# Patient Record
Sex: Female | Born: 1940
Health system: Southern US, Community
[De-identification: ages and names within clinical notes are randomized; demographics above are authoritative.]

## PROBLEM LIST (undated history)

## (undated) DIAGNOSIS — E213 Hyperparathyroidism, unspecified: Secondary | ICD-10-CM

## (undated) DIAGNOSIS — J302 Other seasonal allergic rhinitis: Secondary | ICD-10-CM

## (undated) DIAGNOSIS — I2510 Atherosclerotic heart disease of native coronary artery without angina pectoris: Secondary | ICD-10-CM

## (undated) DIAGNOSIS — E782 Mixed hyperlipidemia: Secondary | ICD-10-CM

## (undated) DIAGNOSIS — K219 Gastro-esophageal reflux disease without esophagitis: Secondary | ICD-10-CM

## (undated) DIAGNOSIS — M109 Gout, unspecified: Secondary | ICD-10-CM

## (undated) DIAGNOSIS — N183 Chronic kidney disease, stage 3 (moderate): Secondary | ICD-10-CM

## (undated) DIAGNOSIS — D638 Anemia in other chronic diseases classified elsewhere: Secondary | ICD-10-CM

## (undated) DIAGNOSIS — N184 Chronic kidney disease, stage 4 (severe): Secondary | ICD-10-CM

## (undated) DIAGNOSIS — I10 Essential (primary) hypertension: Secondary | ICD-10-CM

## (undated) HISTORY — DX: Chronic kidney disease, stage 4 (severe): N18.4

## (undated) HISTORY — DX: Gastro-esophageal reflux disease without esophagitis: K21.9

## (undated) HISTORY — DX: Mixed hyperlipidemia: E78.2

## (undated) HISTORY — DX: Chronic kidney disease, stage 3 (moderate): N18.3

## (undated) HISTORY — DX: Atherosclerotic heart disease of native coronary artery without angina pectoris: I25.10

## (undated) HISTORY — DX: Anemia in other chronic diseases classified elsewhere: D63.8

## (undated) HISTORY — DX: Essential (primary) hypertension: I10

## (undated) HISTORY — DX: Hyperparathyroidism, unspecified: E21.3

## (undated) HISTORY — DX: Gout, unspecified: M10.9

---

## 2008-12-31 ENCOUNTER — Encounter: Payer: Self-pay | Admitting: Endocrinology

## 2009-01-07 ENCOUNTER — Encounter: Payer: Self-pay | Admitting: Endocrinology

## 2009-01-19 ENCOUNTER — Encounter: Payer: Self-pay | Admitting: Endocrinology

## 2009-02-25 ENCOUNTER — Ambulatory Visit: Payer: Self-pay | Admitting: Endocrinology

## 2009-02-25 DIAGNOSIS — I10 Essential (primary) hypertension: Secondary | ICD-10-CM | POA: Insufficient documentation

## 2009-02-25 DIAGNOSIS — K219 Gastro-esophageal reflux disease without esophagitis: Secondary | ICD-10-CM | POA: Insufficient documentation

## 2009-02-25 DIAGNOSIS — J45909 Unspecified asthma, uncomplicated: Secondary | ICD-10-CM | POA: Insufficient documentation

## 2009-02-25 DIAGNOSIS — E212 Other hyperparathyroidism: Secondary | ICD-10-CM

## 2009-08-03 ENCOUNTER — Ambulatory Visit: Payer: Self-pay | Admitting: Endocrinology

## 2009-08-03 LAB — CONVERTED CEMR LAB
BUN: 27 mg/dL — ABNORMAL HIGH (ref 6–23)
Calcium: 11.5 mg/dL — ABNORMAL HIGH (ref 8.4–10.5)
GFR calc non Af Amer: 41.07 mL/min (ref >60)
Glucose, Bld: 109 mg/dL — ABNORMAL HIGH (ref 70–99)
Sodium: 143 meq/L (ref 135–145)

## 2009-08-06 ENCOUNTER — Telehealth: Payer: Self-pay | Admitting: Endocrinology

## 2009-08-11 ENCOUNTER — Telehealth: Payer: Self-pay | Admitting: Endocrinology

## 2011-11-02 ENCOUNTER — Other Ambulatory Visit (HOSPITAL_COMMUNITY): Payer: Self-pay | Admitting: General Surgery

## 2011-11-02 DIAGNOSIS — E0590 Thyrotoxicosis, unspecified without thyrotoxic crisis or storm: Secondary | ICD-10-CM

## 2011-11-03 ENCOUNTER — Encounter (HOSPITAL_COMMUNITY): Payer: Self-pay

## 2011-11-03 ENCOUNTER — Encounter (HOSPITAL_COMMUNITY)
Admission: RE | Admit: 2011-11-03 | Discharge: 2011-11-03 | Disposition: A | Discharge status: Home / Self Care | Payer: Medicare Other | Source: Ambulatory Visit | Attending: General Surgery | Admitting: General Surgery

## 2011-11-03 ENCOUNTER — Ambulatory Visit (HOSPITAL_COMMUNITY)
Admission: RE | Admit: 2011-11-03 | Discharge: 2011-11-03 | Disposition: A | Discharge status: Home / Self Care | Payer: Medicare Other | Source: Ambulatory Visit | Attending: General Surgery | Admitting: General Surgery

## 2011-11-03 DIAGNOSIS — E213 Hyperparathyroidism, unspecified: Secondary | ICD-10-CM | POA: Insufficient documentation

## 2011-11-03 MED ORDER — TECHNETIUM TC 99M SESTAMIBI - CARDIOLITE
25.0000 | Freq: Once | INTRAVENOUS | Status: AC | PRN
  Administered 2011-11-03: 11:00:00 24.8 via INTRAVENOUS

## 2011-11-06 ENCOUNTER — Ambulatory Visit (HOSPITAL_COMMUNITY)
Admission: RE | Admit: 2011-11-06 | Discharge: 2011-11-06 | Disposition: A | Discharge status: Home / Self Care | Payer: Medicare Other | Source: Ambulatory Visit | Attending: General Surgery | Admitting: General Surgery

## 2011-11-06 DIAGNOSIS — E0590 Thyrotoxicosis, unspecified without thyrotoxic crisis or storm: Secondary | ICD-10-CM

## 2011-11-06 MED ORDER — IOHEXOL 300 MG/ML  SOLN: 100.0000 mL | Freq: Once | INTRAMUSCULAR | Status: AC | PRN

## 2011-11-06 MED ORDER — IOHEXOL 300 MG/ML  SOLN
60.0000 mL | Freq: Once | INTRAMUSCULAR | Status: AC | PRN
  Administered 2011-11-06: 60 mL via INTRAVENOUS

## 2011-11-16 NOTE — H&P (Signed)
  NTS SOAP Note  Vital Signs:  Vitals as of: [id number]: Systolic 123XX123: Diastolic 83: Heart Rate 92: Temp 97.5F: Height 70ft 6in: Weight 204Lbs 0 Ounces: OFC 0in: Respiratory Rate 0: O2 Saturation 0: Pain Level 0: BMI 33  BMI : 32.93 kg/m2  Subjective: This 25 Years 24 Months old Female presents for of hyperparathyroidism.  Has had high calcium levels for many years.  Recently seen by Dr. Dorris Fetch who diagnosed primary hyperparathyroidism, and is referring her for surgery.  Review of Symptoms:  Constitutional:unremarkable Head:unremarkable Eyes:unremarkable Nose/Mouth/Throat:unremarkable Cardiovascular:unremarkable Respiratory:unremarkable Gastrointestinal:unremarkable Genitourinary:unremarkable Musculoskeletal:unremarkable Skin:unremarkable Hematolgic/Lymphatic:unremarkable Allergic/Immunologic:unremarkable   Past Medical History:Reviewed   Past Medical History  Surgical History: none Medical Problems:  High Blood pressure, High cholesterol, Thyroid problems Allergies: nkda Medications: unknown   Social History:Reviewed   Social History  Preferred Language: English ([place]) Ethnicity: Not Hispanic / Latino Age: 71 Years 6 Months Alcohol:  No Recreational drug(s):  No   Smoking Status: Former smoker reviewed on 11/02/2011 Started Date: 08/28/1959 Stopped Date: 08/28/1998   Family History:Reviewed   Family History  Is there a family history [id number]    Objective Information: General:unremarkable Skin:no rash or prominent lesions Head:Atraumatic; no masses; no abnormalities Eyes:conjunctiva clear, EOM intact, PERRL Throat:no erythema, exudates or lesions. Neck:Supple without lymphadenopathy.  Heart:RRR, no murmur or gallop.  Normal S1, S2.  No S3, S4.  Lungs:CTA bilaterally, no wheezes, rhonchi, rales.  Breathing unlabored.  Dr. Liliane Channel notes  reviewed.  Assessment:Hyperparathyroidism  Diagnosis &amp; Procedure: DiagnosisCode: 252.01, ProcedureCode: [id number],    Plan:Scheduled for parathyroidectomy on 12/15/11.  Risks and benefits of procedure were fully explained to the patient, who gives informed consent.

## 2011-12-08 ENCOUNTER — Encounter (HOSPITAL_COMMUNITY): Payer: Self-pay | Admitting: Pharmacy Technician

## 2011-12-11 ENCOUNTER — Encounter (HOSPITAL_COMMUNITY)
Admission: RE | Admit: 2011-12-11 | Discharge: 2011-12-11 | Disposition: A | Discharge status: Home / Self Care | Payer: Medicare Other | Source: Ambulatory Visit | Attending: General Surgery | Admitting: General Surgery

## 2011-12-11 ENCOUNTER — Encounter (HOSPITAL_COMMUNITY): Payer: Self-pay

## 2011-12-11 HISTORY — DX: Other seasonal allergic rhinitis: J30.2

## 2011-12-11 LAB — DIFFERENTIAL
Basophils Absolute: 0 10*3/uL (ref 0.0–0.1)
Basophils Relative: 1 % (ref 0–1)
Eosinophils Absolute: 0.4 10*3/uL (ref 0.0–0.7)
Neutro Abs: 3.1 10*3/uL (ref 1.7–7.7)
Neutrophils Relative %: 52 % (ref 43–77)

## 2011-12-11 LAB — CBC
MCH: 27.9 pg (ref 26.0–34.0)
MCHC: 32.7 g/dL (ref 30.0–36.0)
Platelets: 210 10*3/uL (ref 150–400)

## 2011-12-11 LAB — COMPREHENSIVE METABOLIC PANEL
ALT: 12 U/L (ref 0–35)
AST: 16 U/L (ref 0–37)
CO2: 27 mEq/L (ref 19–32)
Chloride: 100 mEq/L (ref 96–112)
Creatinine, Ser: 1.45 mg/dL — ABNORMAL HIGH (ref 0.50–1.10)
GFR calc Af Amer: 41 mL/min — ABNORMAL LOW (ref >90)
GFR calc non Af Amer: 35 mL/min — ABNORMAL LOW (ref >90)
Glucose, Bld: 103 mg/dL — ABNORMAL HIGH (ref 70–99)
Total Bilirubin: 0.4 mg/dL (ref 0.3–1.2)

## 2011-12-11 LAB — SURGICAL PCR SCREEN: Staphylococcus aureus: NEGATIVE

## 2011-12-11 NOTE — Patient Instructions (Signed)
[place]  12/11/2011   Your procedure is scheduled on:  Thursday, 12/15/11  Report to [facility] at 0730 AM.  Call this number if you have problems the morning of surgery: [phone]   Remember:   Do not eat food:After Midnight.  May have clear liquids:until Midnight .  Clear liquids include soda, tea, black coffee, apple or grape juice, broth.  Take these medicines the morning of surgery with A SIP OF WATER: lisinopril   Do not wear jewelry, make-up or nail polish.  Do not wear lotions, powders, or perfumes. You may wear deodorant.  Do not shave 48 hours prior to surgery.  Do not bring valuables to the hospital.  Contacts, dentures or bridgework may not be worn into surgery.  Leave suitcase in the car. After surgery it may be brought to your room.  For patients admitted to the hospital, checkout time is 11:00 AM the day of discharge.   Patients discharged the day of surgery will not be allowed to drive home.  Name and phone number of your driver: driver  Special Instructions: CHG Shower Use Special Wash: 1/2 bottle night before surgery and 1/2 bottle morning of surgery.   Please read over the following fact sheets that you were given: Pain Booklet, MRSA Information, Surgical Site Infection Prevention, Anesthesia Post-op Instructions and Care and Recovery After Surgery   PATIENT INSTRUCTIONS POST-ANESTHESIA  IMMEDIATELY FOLLOWING SURGERY:  Do not drive or operate machinery for the first twenty four hours after surgery.  Do not make any important decisions for twenty four hours after surgery or while taking narcotic pain medications or sedatives.  If you develop intractable nausea and vomiting or a severe headache please notify your doctor immediately.  FOLLOW-UP:  Please make an appointment with your surgeon as instructed. You do not need to follow up with anesthesia unless specifically instructed to do so.  WOUND CARE INSTRUCTIONS (if applicable):  Keep a dry clean dressing on  the anesthesia/puncture wound site if there is drainage.  Once the wound has quit draining you may leave it open to air.  Generally you should leave the bandage intact for twenty four hours unless there is drainage.  If the epidural site drains for more than 36-48 hours please call the anesthesia department.  QUESTIONS?:  Please feel free to call your physician or the hospital operator if you have any questions, and they will be happy to assist you.         Thyroidectomy Care After Refer to this sheet in the next few weeks. These instructions provide you with general information on caring for yourself after you leave the hospital. Your caregiver also may give you specific instructions. Your treatment has been planned according to the most current medical practices available, but problems sometimes occur. Call your caregiver if you have any problems or questions after your procedure. HOME CARE INSTRUCTIONS   It is normal to be sore for a few weeks following surgery. See your caregiver if your pain seems to be getting worse rather than better.   Only take over-the-counter or prescription medicines for pain, discomfort, or fever as directed by your caregiver. Avoid taking medicines that contain aspirin and ibuprofen because they increase the risk of bleeding.   Shower rather than bathe until instructed otherwise by your caregiver.   Change your bandages (dressings) as directed by your caregiver.   You may resume a normal diet and activities as directed by your caregiver.   Avoid lifting weight greater  than 20 lb (9 kg) or participating in heavy exercise or contact sports for 10 days or as instructed by your caregiver.   Make an appointment to see your caregiver for stitch (suture) or staple removal.  SEEK MEDICAL CARE IF:   You have increased bleeding from your wound.   You have redness, swelling, or increasing pain from your wound or in your neck.   There is pus coming from your wound.     You have an oral temperature above 102 F (38.9 C).   There is a bad smell coming from the wound or dressing.   You develop lightheadedness or feel faint.   You develop numbness, tingling, or muscle spasms in your arms, hands, feet, or face.   You have difficulty swallowing.  SEEK IMMEDIATE MEDICAL CARE IF:   You develop a rash.   You have difficulty breathing.   You hear whistling noises that come from your chest.   You develop a cough that becomes increasingly worse.   You develop any reaction or side effects to medicines given.   There is swelling in your neck.   You develop changes in speech or hoarseness, which is getting worse.  MAKE SURE YOU:   Understand these instructions.   Will watch your condition.   Will get help right away if you are not doing well or get worse.  Document Released: 03/03/2005 Document Revised: 08/03/2011 Document Reviewed: 10/21/2010 [facility] Patient Information 2012 [place].

## 2011-12-11 NOTE — Progress Notes (Signed)
12/11/11 1116  OBSTRUCTIVE SLEEP APNEA  Have you ever been diagnosed with sleep apnea through a sleep study? No  Do you snore loudly (loud enough to be heard through closed doors)?  1  Do you often feel tired, fatigued, or sleepy during the daytime? 1  Has anyone observed you stop breathing during your sleep? 0  Do you have, or are you being treated for high blood pressure? 1  BMI more than 35 kg/m2? 0  Age over 72 years old? 1  Neck circumference greater than 40 cm/18 inches? 0  Gender: 0  Obstructive Sleep Apnea Score 4   Score 4 or greater  Updated health history

## 2011-12-15 ENCOUNTER — Encounter (HOSPITAL_COMMUNITY): Payer: Self-pay | Admitting: Anesthesiology

## 2011-12-15 ENCOUNTER — Encounter (HOSPITAL_COMMUNITY): Payer: Self-pay | Admitting: *Deleted

## 2011-12-15 ENCOUNTER — Ambulatory Visit (HOSPITAL_COMMUNITY): Payer: Medicare Other | Admitting: Anesthesiology

## 2011-12-15 ENCOUNTER — Encounter (HOSPITAL_COMMUNITY)
Admission: RE | Disposition: A | Discharge status: Home / Self Care | Payer: Self-pay | Source: Ambulatory Visit | Attending: General Surgery

## 2011-12-15 ENCOUNTER — Ambulatory Visit (HOSPITAL_COMMUNITY)
Admission: RE | Admit: 2011-12-15 | Discharge: 2011-12-16 | Disposition: A | Discharge status: Home / Self Care | Payer: Medicare Other | Source: Ambulatory Visit | Attending: General Surgery | Admitting: General Surgery

## 2011-12-15 DIAGNOSIS — Z01812 Encounter for preprocedural laboratory examination: Secondary | ICD-10-CM | POA: Insufficient documentation

## 2011-12-15 DIAGNOSIS — E210 Primary hyperparathyroidism: Secondary | ICD-10-CM | POA: Insufficient documentation

## 2011-12-15 DIAGNOSIS — Z01810 Encounter for preprocedural cardiovascular examination: Secondary | ICD-10-CM | POA: Insufficient documentation

## 2011-12-15 DIAGNOSIS — E7800 Pure hypercholesterolemia, unspecified: Secondary | ICD-10-CM | POA: Insufficient documentation

## 2011-12-15 DIAGNOSIS — I10 Essential (primary) hypertension: Secondary | ICD-10-CM | POA: Insufficient documentation

## 2011-12-15 DIAGNOSIS — Z79899 Other long term (current) drug therapy: Secondary | ICD-10-CM | POA: Insufficient documentation

## 2011-12-15 HISTORY — PX: PARATHYROIDECTOMY: SHX19

## 2011-12-15 LAB — BASIC METABOLIC PANEL
CO2: 27 mEq/L (ref 19–32)
Chloride: 100 mEq/L (ref 96–112)
Creatinine, Ser: 1.74 mg/dL — ABNORMAL HIGH (ref 0.50–1.10)
Glucose, Bld: 162 mg/dL — ABNORMAL HIGH (ref 70–99)

## 2011-12-15 SURGERY — PARATHYROIDECTOMY
Anesthesia: General | Laterality: Bilateral | Wound class: Clean

## 2011-12-15 MED ORDER — ACETAMINOPHEN 10 MG/ML IV SOLN
1000.0000 mg | Freq: Four times a day (QID) | INTRAVENOUS | Status: AC
  Administered 2011-12-15 – 2011-12-16 (×4): 1000 mg via INTRAVENOUS
  Filled 2011-12-15 (×3): qty 100

## 2011-12-15 MED ORDER — ONDANSETRON HCL 4 MG/2ML IJ SOLN: 4.0000 mg | Freq: Four times a day (QID) | INTRAMUSCULAR | Status: DC | PRN

## 2011-12-15 MED ORDER — ENOXAPARIN SODIUM 40 MG/0.4ML ~~LOC~~ SOLN
SUBCUTANEOUS | Status: AC
  Administered 2011-12-15: 40 mg via SUBCUTANEOUS
  Filled 2011-12-15: qty 0.4

## 2011-12-15 MED ORDER — ONDANSETRON HCL 4 MG/2ML IJ SOLN
4.0000 mg | Freq: Once | INTRAMUSCULAR | Status: AC
  Administered 2011-12-15: 4 mg via INTRAVENOUS

## 2011-12-15 MED ORDER — FENTANYL CITRATE 0.05 MG/ML IJ SOLN
INTRAMUSCULAR | Status: AC
  Administered 2011-12-15: 50 ug via INTRAVENOUS
  Filled 2011-12-15: qty 5

## 2011-12-15 MED ORDER — NEOSTIGMINE METHYLSULFATE 1 MG/ML IJ SOLN
INTRAMUSCULAR | Status: DC | PRN
  Administered 2011-12-15: 1 mg via INTRAVENOUS

## 2011-12-15 MED ORDER — CEFAZOLIN SODIUM 1-5 GM-% IV SOLN
1.0000 g | INTRAVENOUS | Status: AC
  Administered 2011-12-15: 1 g via INTRAVENOUS

## 2011-12-15 MED ORDER — BUMETANIDE 1 MG PO TABS
2.0000 mg | ORAL_TABLET | Freq: Every day | ORAL | Status: DC
  Administered 2011-12-16: 2 mg via ORAL
  Filled 2011-12-15: qty 2

## 2011-12-15 MED ORDER — SUCCINYLCHOLINE CHLORIDE 20 MG/ML IJ SOLN
INTRAMUSCULAR | Status: AC
  Filled 2011-12-15: qty 1

## 2011-12-15 MED ORDER — GLYCOPYRROLATE 0.2 MG/ML IJ SOLN
0.2000 mg | Freq: Once | INTRAMUSCULAR | Status: AC
  Administered 2011-12-15: 0.2 mg via INTRAVENOUS

## 2011-12-15 MED ORDER — LACTATED RINGERS IV SOLN
INTRAVENOUS | Status: DC
  Administered 2011-12-15: 13:00:00 via INTRAVENOUS

## 2011-12-15 MED ORDER — MENTHOL 3 MG MT LOZG: 1.0000 | LOZENGE | OROMUCOSAL | Status: DC | PRN

## 2011-12-15 MED ORDER — BUPIVACAINE HCL (PF) 0.5 % IJ SOLN
INTRAMUSCULAR | Status: AC
  Filled 2011-12-15: qty 30

## 2011-12-15 MED ORDER — FENTANYL CITRATE 0.05 MG/ML IJ SOLN
INTRAMUSCULAR | Status: AC
  Administered 2011-12-15: 50 ug via INTRAVENOUS
  Filled 2011-12-15: qty 2

## 2011-12-15 MED ORDER — LIDOCAINE HCL (CARDIAC) 10 MG/ML IV SOLN
INTRAVENOUS | Status: DC | PRN
  Administered 2011-12-15: 10 mg via INTRAVENOUS

## 2011-12-15 MED ORDER — FENTANYL CITRATE 0.05 MG/ML IJ SOLN
25.0000 ug | INTRAMUSCULAR | Status: DC | PRN
  Administered 2011-12-15 (×2): 50 ug via INTRAVENOUS

## 2011-12-15 MED ORDER — ENOXAPARIN SODIUM 40 MG/0.4ML ~~LOC~~ SOLN
40.0000 mg | Freq: Once | SUBCUTANEOUS | Status: AC
  Administered 2011-12-15: 40 mg via SUBCUTANEOUS

## 2011-12-15 MED ORDER — HYDROMORPHONE HCL PF 1 MG/ML IJ SOLN
1.0000 mg | INTRAMUSCULAR | Status: DC | PRN
  Administered 2011-12-15: 1 mg via INTRAVENOUS
  Filled 2011-12-15: qty 1

## 2011-12-15 MED ORDER — ONDANSETRON HCL 4 MG/2ML IJ SOLN
INTRAMUSCULAR | Status: AC
  Filled 2011-12-15: qty 2

## 2011-12-15 MED ORDER — BUDESONIDE-FORMOTEROL FUMARATE 160-4.5 MCG/ACT IN AERO
2.0000 | INHALATION_SPRAY | Freq: Two times a day (BID) | RESPIRATORY_TRACT | Status: DC | PRN
  Filled 2011-12-15: qty 6

## 2011-12-15 MED ORDER — LISINOPRIL 10 MG PO TABS
20.0000 mg | ORAL_TABLET | Freq: Every day | ORAL | Status: DC
  Administered 2011-12-16: 20 mg via ORAL
  Filled 2011-12-15: qty 2

## 2011-12-15 MED ORDER — ONDANSETRON HCL 4 MG PO TABS: 4.0000 mg | ORAL_TABLET | Freq: Four times a day (QID) | ORAL | Status: DC | PRN

## 2011-12-15 MED ORDER — CEFAZOLIN SODIUM 1-5 GM-% IV SOLN
INTRAVENOUS | Status: AC
  Filled 2011-12-15: qty 50

## 2011-12-15 MED ORDER — ONDANSETRON HCL 4 MG/2ML IJ SOLN: 4.0000 mg | Freq: Once | INTRAMUSCULAR | Status: DC | PRN

## 2011-12-15 MED ORDER — MIDAZOLAM HCL 2 MG/2ML IJ SOLN
1.0000 mg | INTRAMUSCULAR | Status: DC | PRN
  Administered 2011-12-15: 2 mg via INTRAVENOUS

## 2011-12-15 MED ORDER — ACETAMINOPHEN 10 MG/ML IV SOLN
INTRAVENOUS | Status: AC
  Administered 2011-12-15: 1000 mg via INTRAVENOUS
  Filled 2011-12-15: qty 100

## 2011-12-15 MED ORDER — GLYCOPYRROLATE 0.2 MG/ML IJ SOLN
INTRAMUSCULAR | Status: AC
  Filled 2011-12-15: qty 1

## 2011-12-15 MED ORDER — MIDAZOLAM HCL 2 MG/2ML IJ SOLN
INTRAMUSCULAR | Status: AC
  Filled 2011-12-15: qty 2

## 2011-12-15 MED ORDER — ENOXAPARIN SODIUM 40 MG/0.4ML ~~LOC~~ SOLN
40.0000 mg | SUBCUTANEOUS | Status: DC
  Administered 2011-12-16: 40 mg via SUBCUTANEOUS
  Filled 2011-12-15: qty 0.4

## 2011-12-15 MED ORDER — GLYCOPYRROLATE 0.2 MG/ML IJ SOLN
INTRAMUSCULAR | Status: AC
  Administered 2011-12-15: 0.2 mg via INTRAVENOUS
  Filled 2011-12-15: qty 1

## 2011-12-15 MED ORDER — LACTATED RINGERS IV SOLN
INTRAVENOUS | Status: DC
  Administered 2011-12-15: 1000 mL via INTRAVENOUS

## 2011-12-15 MED ORDER — PROPOFOL 10 MG/ML IV EMUL
INTRAVENOUS | Status: DC | PRN
  Administered 2011-12-15: 150 mg via INTRAVENOUS
  Administered 2011-12-15: 30 mg via INTRAVENOUS

## 2011-12-15 MED ORDER — HEMOSTATIC AGENTS (NO CHARGE) OPTIME
TOPICAL | Status: DC | PRN
  Administered 2011-12-15: 1 via TOPICAL

## 2011-12-15 MED ORDER — SUCCINYLCHOLINE CHLORIDE 20 MG/ML IJ SOLN
INTRAMUSCULAR | Status: DC | PRN
  Administered 2011-12-15: 100 mg via INTRAVENOUS

## 2011-12-15 MED ORDER — ACETAMINOPHEN 325 MG PO TABS: 325.0000 mg | ORAL_TABLET | ORAL | Status: DC | PRN

## 2011-12-15 MED ORDER — ROCURONIUM BROMIDE 100 MG/10ML IV SOLN
INTRAVENOUS | Status: DC | PRN
  Administered 2011-12-15: 5 mg via INTRAVENOUS

## 2011-12-15 MED ORDER — NEOSTIGMINE METHYLSULFATE 1 MG/ML IJ SOLN
INTRAMUSCULAR | Status: AC
  Filled 2011-12-15: qty 10

## 2011-12-15 MED ORDER — BUPIVACAINE HCL (PF) 0.5 % IJ SOLN
INTRAMUSCULAR | Status: DC | PRN
  Administered 2011-12-15: 10 mL

## 2011-12-15 MED ORDER — FENTANYL CITRATE 0.05 MG/ML IJ SOLN
INTRAMUSCULAR | Status: DC | PRN
  Administered 2011-12-15: 50 ug via INTRAVENOUS
  Administered 2011-12-15 (×3): 25 ug via INTRAVENOUS
  Administered 2011-12-15: 50 ug via INTRAVENOUS

## 2011-12-15 MED ORDER — SODIUM CHLORIDE 0.9 % IR SOLN
Status: DC | PRN
  Administered 2011-12-15: 1000 mL

## 2011-12-15 MED ORDER — GLYCOPYRROLATE 0.2 MG/ML IJ SOLN
INTRAMUSCULAR | Status: DC | PRN
  Administered 2011-12-15: 0.2 mg via INTRAVENOUS

## 2011-12-15 SURGICAL SUPPLY — 58 items
APPLIER CLIP 11 MED OPEN (CLIP) ×2
APPLIER CLIP 9.375 SM OPEN (CLIP) ×6
ATTRACTOMAT 16X20 MAGNETIC DRP (DRAPES) ×2 IMPLANT
BAG HAMPER (MISCELLANEOUS) ×2 IMPLANT
BENZOIN TINCTURE PRP APPL 2/3 (GAUZE/BANDAGES/DRESSINGS) ×2 IMPLANT
BLADE SURG 15 STRL LF DISP TIS (BLADE) ×1 IMPLANT
BLADE SURG 15 STRL SS (BLADE) ×1
BLADE SURG SZ10 CARB STEEL (BLADE) ×2 IMPLANT
CLIP APPLIE 11 MED OPEN (CLIP) ×1 IMPLANT
CLIP APPLIE 9.375 SM OPEN (CLIP) ×3 IMPLANT
CLOTH BEACON ORANGE TIMEOUT ST (SAFETY) ×2 IMPLANT
CLSR STERI-STRIP ANTIMIC 1/2X4 (GAUZE/BANDAGES/DRESSINGS) ×2 IMPLANT
COVER SURGICAL LIGHT HANDLE (MISCELLANEOUS) ×4 IMPLANT
DERMABOND ADVANCED (GAUZE/BANDAGES/DRESSINGS)
DERMABOND ADVANCED .7 DNX12 (GAUZE/BANDAGES/DRESSINGS) IMPLANT
DRAPE PED LAPAROTOMY (DRAPES) ×2 IMPLANT
DRAPE PROXIMA HALF (DRAPES) ×2 IMPLANT
DURAPREP 26ML APPLICATOR (WOUND CARE) IMPLANT
DURAPREP 6ML APPLICATOR 50/CS (WOUND CARE) ×2 IMPLANT
ELECT NEEDLE TIP 2.8 STRL (NEEDLE) ×2 IMPLANT
ELECT REM PT RETURN 9FT ADLT (ELECTROSURGICAL) ×2
ELECTRODE REM PT RTRN 9FT ADLT (ELECTROSURGICAL) ×1 IMPLANT
FORMALIN 10 PREFIL 120ML (MISCELLANEOUS) ×4 IMPLANT
GAUZE SPONGE 4X4 16PLY XRAY LF (GAUZE/BANDAGES/DRESSINGS) ×4 IMPLANT
GLOVE BIO SURGEON STRL SZ7.5 (GLOVE) ×2 IMPLANT
GLOVE ECLIPSE 6.5 STRL STRAW (GLOVE) ×4 IMPLANT
GLOVE INDICATOR 7.0 STRL GRN (GLOVE) ×6 IMPLANT
GOWN STRL REIN XL XLG (GOWN DISPOSABLE) ×8 IMPLANT
HEMOSTAT SURGICEL 2X3 (HEMOSTASIS) ×2 IMPLANT
HEMOSTAT SURGICEL 4X8 (HEMOSTASIS) ×2 IMPLANT
KIT BLADEGUARD II DBL (SET/KITS/TRAYS/PACK) ×2 IMPLANT
KIT ROOM TURNOVER APOR (KITS) ×2 IMPLANT
MANIFOLD NEPTUNE II (INSTRUMENTS) ×2 IMPLANT
MARKER SKIN DUAL TIP RULER LAB (MISCELLANEOUS) ×2 IMPLANT
NEEDLE HYPO 25X1 1.5 SAFETY (NEEDLE) ×2 IMPLANT
NS IRRIG 1000ML POUR BTL (IV SOLUTION) ×2 IMPLANT
PACK BASIC III (CUSTOM PROCEDURE TRAY) ×1
PACK SRG BSC III STRL LF ECLPS (CUSTOM PROCEDURE TRAY) ×1 IMPLANT
PAD ARMBOARD 7.5X6 YLW CONV (MISCELLANEOUS) ×2 IMPLANT
PENCIL HANDSWITCHING (ELECTRODE) ×2 IMPLANT
SET BASIN LINEN APH (SET/KITS/TRAYS/PACK) ×2 IMPLANT
SHEARS HARMONIC 9CM CVD (BLADE) IMPLANT
SPONGE INTESTINAL PEANUT (DISPOSABLE) ×8 IMPLANT
STAPLER VISISTAT (STAPLE) ×2 IMPLANT
SUT ETHILON 3 0 FSL (SUTURE) ×2 IMPLANT
SUT ETHILON 4 0 PS 2 18 (SUTURE) ×2 IMPLANT
SUT SILK 2 0 (SUTURE) ×1
SUT SILK 2-0 18XBRD TIE 12 (SUTURE) ×1 IMPLANT
SUT SILK 3 0 (SUTURE)
SUT SILK 3-0 18XBRD TIE 12 (SUTURE) IMPLANT
SUT VIC AB 2-0 CT2 27 (SUTURE) ×4 IMPLANT
SUT VIC AB 3-0 SH 27 (SUTURE) ×1
SUT VIC AB 3-0 SH 27X BRD (SUTURE) ×1 IMPLANT
SUT VIC AB 4-0 PS2 27 (SUTURE) ×4 IMPLANT
SYR CONTROL 10ML LL (SYRINGE) ×2 IMPLANT
SYSTEM CHEST DRAIN TLS 7FR (DRAIN) ×2 IMPLANT
TOWEL OR 17X26 4PK STRL BLUE (TOWEL DISPOSABLE) IMPLANT
YANKAUER SUCT BULB TIP 10FT TU (MISCELLANEOUS) ×2 IMPLANT

## 2011-12-15 NOTE — Interval H&P Note (Signed)
History and Physical Interval Note:  12/15/2011 7:30 AM  Julia Santana  has presented today for surgery, with the diagnosis of Hyperthyroidism  The various methods of treatment have been discussed with the patient and family. After consideration of risks, benefits and other options for treatment, the patient has consented to  Procedure(s) (LRB): THYROIDECTOMY (Bilateral) as a surgical intervention .  The patients' history has been reviewed, patient examined, no change in status, stable for surgery.  I have reviewed the patients' chart and labs.  Questions were answered to the patient's satisfaction.     Aviva Signs A

## 2011-12-15 NOTE — Anesthesia Postprocedure Evaluation (Signed)
Anesthesia Post Note  Patient: Julia Santana  Procedure(s) Performed: Procedure(s) (LRB): PARATHYROIDECTOMY (Bilateral)  Anesthesia type: General  Patient location: 301  Post pain: Pain level controlled  Post assessment: Post-op Vital signs reviewed, Patient's Cardiovascular Status Stable, Respiratory Function Stable, Patent Airway, No signs of Nausea or vomiting and Pain level controlled  Last Vitals:  Filed Vitals:   12/15/11 1527  BP: 152/87  Pulse: 91  Temp: 36.3 C  Resp: 18    Post vital signs: Reviewed and stable  Level of consciousness: awake and alert   Complications: No apparent anesthesia complications

## 2011-12-15 NOTE — Transfer of Care (Signed)
Immediate Anesthesia Transfer of Care Note  Patient: Julia Santana  Procedure(s) Performed: Procedure(s) (LRB): THYROIDECTOMY (Bilateral)  Patient Location: PACU  Anesthesia Type: General  Level of Consciousness: awake  Airway & Oxygen Therapy: Patient Spontanous Breathing and non-rebreather face mask  Post-op Assessment: Report given to PACU RN, Post -op Vital signs reviewed and stable and Patient moving all extremities  Post vital signs: Reviewed and stable  Complications: No apparent anesthesia complications

## 2011-12-15 NOTE — Addendum Note (Signed)
Addendum  created 12/15/11 1716 by Vista Deck, CRNA   Modules edited:Notes Section

## 2011-12-15 NOTE — Anesthesia Postprocedure Evaluation (Signed)
Anesthesia Post Note  Patient: Julia Santana  Procedure(s) Performed: Procedure(s) (LRB): PARATHYROIDECTOMY (Bilateral)  Anesthesia type: General  Patient location: PACU  Post pain: Pain level controlled  Post assessment: Post-op Vital signs reviewed, Patient's Cardiovascular Status Stable, Respiratory Function Stable, Patent Airway, No signs of Nausea or vomiting and Pain level controlled  Last Vitals:  Filed Vitals:   12/15/11 1003  BP: 129/85  Pulse: 89  Temp: 36.5 C  Resp: 16    Post vital signs: Reviewed and stable  Level of consciousness: awake and alert   Complications: No apparent anesthesia complications

## 2011-12-15 NOTE — Anesthesia Procedure Notes (Signed)
Procedure Name: Intubation Date/Time: 12/15/2011 8:23 AM Performed by: Vista Deck Pre-anesthesia Checklist: Patient identified, Patient being monitored, Timeout performed, Emergency Drugs available and Suction available Patient Re-evaluated:Patient Re-evaluated prior to inductionOxygen Delivery Method: Circle System Utilized Preoxygenation: Pre-oxygenation with 100% oxygen Intubation Type: IV induction, Rapid sequence and Cricoid Pressure applied Laryngoscope Size: Mac and 3 Grade View: Grade I Tube type: Oral Tube size: 7.0 mm Number of attempts: 1 Airway Equipment and Method: stylet Placement Confirmation: ETT inserted through vocal cords under direct vision,  positive ETCO2 and breath sounds checked- equal and bilateral Secured at: 21 cm Tube secured with: Tape Dental Injury: Teeth and Oropharynx as per pre-operative assessment

## 2011-12-15 NOTE — Anesthesia Preprocedure Evaluation (Signed)
Anesthesia Evaluation  Patient identified by MRN, date of birth, ID band Patient awake    Reviewed: Allergy & Precautions, H&P , NPO status , Patient's Chart, lab work & pertinent test results  Airway Mallampati: II TM Distance: >3 FB Neck ROM: Full    Dental No notable dental hx. (+) Edentulous Upper and Upper Dentures   Pulmonary asthma , sleep apnea ,    Pulmonary exam normal       Cardiovascular hypertension, Pt. on medications Rhythm:Regular Rate:Normal     Neuro/Psych negative neurological ROS  negative psych ROS   GI/Hepatic Neg liver ROS, GERD-  Medicated and Controlled,  Endo/Other  Hyperthyroidism (hyperparathyroid)   Renal/GU Renal InsufficiencyRenal disease     Musculoskeletal negative musculoskeletal ROS (+)   Abdominal Normal abdominal exam  (+)   Peds  Hematology  (+) Blood dyscrasia, anemia ,   Anesthesia Other Findings   Reproductive/Obstetrics negative OB ROS                           Anesthesia Physical Anesthesia Plan  ASA: II  Anesthesia Plan: General   Post-op Pain Management:    Induction: Intravenous, Rapid sequence and Cricoid pressure planned  Airway Management Planned: Oral ETT  Additional Equipment:   Intra-op Plan:   Post-operative Plan: Extubation in OR  Informed Consent: I have reviewed the patients History and Physical, chart, labs and discussed the procedure including the risks, benefits and alternatives for the proposed anesthesia with the patient or authorized representative who has indicated his/her understanding and acceptance.     Plan Discussed with: CRNA  Anesthesia Plan Comments:         Anesthesia Quick Evaluation

## 2011-12-15 NOTE — Op Note (Signed)
Patient:  Julia Santana  DOB:  1941/06/05  MRN:  [id number]   Preop Diagnosis:  Primary hyperparathyroidism, hypercalcemia  Postop Diagnosis:  Same  Procedure:  Parathyroidectomy  Surgeon:  Aviva Signs, M.D.  Anes:  General endotracheal  Indications:  Patient is a 71 year old white female presents with primary hyperparathyroidism with resultant hypercalcemia. She has had extensive workup and the only thing that was suspicious was increased activity in the right superior aspect of the neck. This was very nonspecific in nature. She now comes the operating room for a parathyroidectomy. The risks and benefits of the procedure including bleeding, infection, nerve injury, and the possibility of continuing hypercalcemia were fully explained to the patient, gave informed consent.  Procedure note:  Patient is placed the supine position. After induction of general endotracheal anesthesia, the neck was prepped and draped using usual sterile technique with Clorpactin surgical site confirmation was performed.  A transverse incision was made above the jugular notch. The platysma was then divided. The strap muscles were then divided along the midline and separated laterally. First turned our attention to the right thyroid. The middle thyroidal artery and vein were ligated and divided using clips. Both the inferior and superior right parathyroid glands were identified. These were removed separately and using clips and sent to pathology further examination. On the left side, the superior parathyroid gland was found. This was sent to pathology further examination. Again, middle thyroidal artery and vein were ligated and divided using clips. I could not definitively define the inferior left parathyroid. In palpating the left lobe of thyroid gland, I cannot feel a dominant mass. As 3 parathyroid glands were already removed, I decided not to proceed with more extensive dissection for the fourth left inferior  parathyroid gland. Surgicel is placed in both areas. The strap muscles reapproximated using 2-0 Vicryl running suture. The platysma was closed using 3-0 Vicryl running suture. The skin was closed using a 4-0 Vicryl subcuticular suture. 0.5% Sensorcaine was instilled the surrounding wound. Steri-Strips and were applied.  All tape and needle counts were correct the end of the procedure. Patient was extubated in the operating room and went back to recovery room awake in stable condition. She was able to phonate E. without difficulty.  Complications:  None  EBL:  Minimal  Specimen:  Right superior parathyroid, right inferior parathyroid, left superior parathyroid

## 2011-12-16 LAB — BASIC METABOLIC PANEL
CO2: 25 mEq/L (ref 19–32)
Calcium: 11.7 mg/dL — ABNORMAL HIGH (ref 8.4–10.5)
Glucose, Bld: 103 mg/dL — ABNORMAL HIGH (ref 70–99)
Sodium: 134 mEq/L — ABNORMAL LOW (ref 135–145)

## 2011-12-16 LAB — CBC
Hemoglobin: 10.1 g/dL — ABNORMAL LOW (ref 12.0–15.0)
MCH: 28.1 pg (ref 26.0–34.0)
MCV: 87.2 fL (ref 78.0–100.0)
Platelets: 189 10*3/uL (ref 150–400)
RBC: 3.6 MIL/uL — ABNORMAL LOW (ref 3.87–5.11)

## 2011-12-16 MED ORDER — HYDROCODONE-ACETAMINOPHEN 5-325 MG PO TABS: 1.0000 | ORAL_TABLET | Freq: Four times a day (QID) | ORAL | Status: AC | PRN

## 2011-12-16 NOTE — Discharge Summary (Signed)
Physician Discharge Summary  Patient ID: Julia Santana MRN: [id number] DOB/AGE: 1941/02/04 71 y.o.  Admit date: 12/15/2011 Discharge date: 12/16/2011  Admission Diagnoses: Primary hyperparathyroidism  Discharge Diagnoses: Same Active Problems:  * No active hospital problems. *    Discharged Condition: good  Hospital Course: Patient is a 71 year old black female who presented to [facility] for surgery for treatment of primary hyperparathyroidism. She underwent a parathyroidectomy on 12/15/2011. She tolerated the procedure well. Postoperative course has been unremarkable. She has no significant voice changes. Her calcium did drop from a preop level of 12.2 to 11.4-11.7. She is being discharged home in good improving condition the  Treatments: surgery: Parathyroidectomy  Discharge Exam: Blood pressure 142/75, pulse 73, temperature 97.9 F (36.6 C), temperature source Oral, resp. rate 18, height 5\' 9"  (1.753 m), weight 90.719 kg (200 lb), SpO2 88.00%. General appearance: alert, cooperative and no distress Neck: Incision healing well with mild swelling present. No hematoma noted Resp: clear to auscultation bilaterally Cardio: regular rate and rhythm, S1, S2 normal, no murmur, click, rub or gallop  Disposition: Home  Medication List  As of 12/16/2011 10:17 AM   TAKE these medications         budesonide-formoterol 160-4.5 MCG/ACT inhaler   Commonly known as: SYMBICORT   Inhale 2 puffs into the lungs as needed. Shortness of breath      bumetanide 2 MG tablet   Commonly known as: BUMEX   Take 2 mg by mouth daily.      HYDROcodone-acetaminophen 5-325 MG per tablet   Commonly known as: NORCO   Take 1 tablet by mouth every 6 (six) hours as needed for pain.      lisinopril 20 MG tablet   Commonly known as: PRINIVIL,ZESTRIL   Take 20 mg by mouth daily.           Follow-up Information    Follow up with Jamesetta So, MD. Schedule an appointment as soon as possible for a  visit on 12/21/2011.   Contact information:   [address] [place] [phone]          Signed: Jamesetta So 12/16/2011, 10:17 AM

## 2011-12-16 NOTE — Discharge Instructions (Signed)
Parathyroidectomy A parathyroidectomy is surgery to remove one or more parathyroid glands. These glands produce a hormone (parathyroid hormone) that helps control the level of calcium in your body. The glands are very small, about the size of a pea. They are located in your neck, close to your thyroid gland and your Adam's apple. Most people (85%) have four parathyroid glands,some people may have one or two more than that. Hyperparathyroidism is when too much parathyroid hormone is being produced. Usually this is caused by one of the parathyroid glands becoming enlarged, but it can also be caused by more than one of the glands. Hyperparathyroidism is found during blood tests that show high calcium in the blood. Parathyroid hormone levels will also be elevated. Cancer also can cause hyperparathyroidism, but this is rare. For the most common type of hyperparathyroidism, the treatment is surgical removal of the parathyroid gland that is enlarged. For patients with kidney failure and hyperparathyroidism, other treatment will be tried before surgery is done on the parathyroid.  Many times x-ray studies are done to find out which parathyroid gland or glands is malfunctioning. The decision about the best treatment for hyperparathyroidism is between the patient, their primary doctor, an endocrinologist, and a surgeon experienced in parathyroid surgery. LET YOUR CAREGIVER KNOW ABOUT:  Any allergies.   All medications you are taking, including:   Herbs, eyedrops, over-the-counter medications and creams.   Blood thinners (anticoagulants), aspirin or other drugs that could affect blood clotting.   Use of steroids (by mouth or as creams).   Previous problems with anesthetics, including local anesthetics.   Possibility of pregnancy, if this applies.   Any history of blood clots.   Any history of bleeding or other blood problems.   Previous surgery.   Smoking history.   Other health problems.  RISKS  AND COMPLICATIONS   Short-term possibilities include:   Excessive bleeding.   Pain.   Infection near the incision.   Slow healing.   Pooling of blood under the wound (hematoma).   Damage to nerves in your neck.   Blood clots.   Difficulty breathing. This is very rare. It also is almost always temporary.   Longer-term possibilities include:   Scarring.   Skin damage.   Damage to blood vessels in the area.   Need for additional surgery.   A hoarse or weak voice. This is usually temporary. It can be the result of nerve damage.   Development of hypoparathyroidism. This means you are not making enough parathyroid hormone. It is rare. If it occurs, you will need to take calcium supplements daily.  BEFORE THE PROCEDURE  Sometimes the surgery is done on an outpatient basis. This means you could go home the same day as your surgery. Other times, people need to stay in the hospital overnight. Ask your surgeon what you should expect.   If your surgery will be an outpatient procedure, arrange for someone to drive you home after the surgery.   Two weeks before your surgery, stop using aspirin and non-steroidal anti-inflammatory drugs (NSAID's) for pain relief. This includes prescription drugs and over-the-counter drugs such as ibuprofen and naproxen. Also stop taking vitamin E.   If you take blood-thinners, ask your healthcare provider when you should stop taking them.   Do not eat or drink for about 8 hours before your surgery.   You might be asked to shower or wash with a special antibacterial soap before the procedure.   Arrive at least an hour before the  surgery, or whenever your surgeon recommends. This will give you time to check in and fill out any needed paperwork.  PROCEDURE  The preparation:   You will change into a hospital gown.   You will be given an IV. A needle will be inserted in your arm. Medication will be able to flow directly into your body through this  needle.   You might be given a sedative to help you relax.   You will be given a drug that puts you to sleep during the surgery (general anesthetic).   The procedure:   Once you are asleep, the surgeon will make a small cut (incision) in your lower neck. Ask your surgeon where the incision will be.   The surgeon will look for the gland(s) that are not working well. Often a tissue sample from a gland is used to determine this.   Any glands that are not working well will be removed.   The surgeon will close the incision with stitches, often these are hidden under the skin.  AFTER THE PROCEDURE  You will stay in a recovery area until the anesthesia has worn off. Your blood pressure and heart rate will be checked.   If your surgery was an outpatient procedure, you will go home the same day.   If you need to stay in the hospital, you will be moved to a hospital room. You will probably stay for two to three days. This will depend on how quickly you recover.   While you are in the hospital, your blood will be tested to check the calcium levels in your body.  HOME CARE INSTRUCTIONS   Take any medication that your surgeon prescribes. Follow the directions carefully. Take all of the medication.   Ask your surgeon whether you can take over-the-counter medicines for pain, discomfort or fever. Do not take aspirin unless your healthcare provider says to. Aspirin increases the chances of bleeding.   Do not get the wound wet for the first few days after surgery (or until the surgeon tells you it is OK).  SEEK MEDICAL CARE IF:   You notice blood or fluid leaking from the wound, or it becomes red or swollen.   You have trouble breathing.   You have trouble speaking.   You become nauseous or throw up for more than two days after the surgery.   You develop a fever of more than 100.5 F (38.1 C).  SEEK IMMEDIATE MEDICAL CARE IF:   Breathing becomes more difficult.   You develop a fever of  102.0 F (38.9 C) or higher.  Document Released: 11/10/2008 Document Revised: 08/03/2011 Document Reviewed: 11/10/2008 [facility] Patient Information 2012 [place].

## 2011-12-17 NOTE — Progress Notes (Signed)
Patient received discharge instructions along with follow up appointments and prescriptions. Patient verbalized understanding of all instructions.. Patient was escorted by staff via wheelchair to vehicle patient discharged to home in stable condition.

## 2011-12-19 ENCOUNTER — Encounter (HOSPITAL_COMMUNITY): Payer: Self-pay | Admitting: General Surgery

## 2012-03-19 DIAGNOSIS — R079 Chest pain, unspecified: Secondary | ICD-10-CM

## 2012-03-20 ENCOUNTER — Ambulatory Visit (INDEPENDENT_AMBULATORY_CARE_PROVIDER_SITE_OTHER): Payer: Medicare Other | Admitting: Cardiology

## 2012-03-20 ENCOUNTER — Encounter: Payer: Self-pay | Admitting: Cardiology

## 2012-03-20 ENCOUNTER — Other Ambulatory Visit: Payer: Self-pay | Admitting: Cardiology

## 2012-03-20 ENCOUNTER — Encounter: Payer: Self-pay | Admitting: *Deleted

## 2012-03-20 ENCOUNTER — Telehealth: Payer: Self-pay | Admitting: Cardiology

## 2012-03-20 VITALS — BP 157/83 | HR 79 | Ht 66.0 in | Wt 202.0 lb

## 2012-03-20 DIAGNOSIS — I200 Unstable angina: Secondary | ICD-10-CM

## 2012-03-20 DIAGNOSIS — Z01810 Encounter for preprocedural cardiovascular examination: Secondary | ICD-10-CM

## 2012-03-20 DIAGNOSIS — N289 Disorder of kidney and ureter, unspecified: Secondary | ICD-10-CM

## 2012-03-20 DIAGNOSIS — R9430 Abnormal result of cardiovascular function study, unspecified: Secondary | ICD-10-CM

## 2012-03-20 DIAGNOSIS — R079 Chest pain, unspecified: Secondary | ICD-10-CM

## 2012-03-20 DIAGNOSIS — E782 Mixed hyperlipidemia: Secondary | ICD-10-CM | POA: Insufficient documentation

## 2012-03-20 DIAGNOSIS — R9439 Abnormal result of other cardiovascular function study: Secondary | ICD-10-CM

## 2012-03-20 DIAGNOSIS — I10 Essential (primary) hypertension: Secondary | ICD-10-CM

## 2012-03-20 LAB — PROTIME-INR

## 2012-03-20 MED ORDER — ASPIRIN EC 81 MG PO TBEC: 81.0000 mg | DELAYED_RELEASE_TABLET | Freq: Every day | ORAL | Status: DC

## 2012-03-20 MED ORDER — NITROGLYCERIN 0.4 MG SL SUBL: 0.4000 mg | SUBLINGUAL_TABLET | SUBLINGUAL | Status: DC | PRN

## 2012-03-20 MED ORDER — METOPROLOL SUCCINATE ER 25 MG PO TB24: 25.0000 mg | ORAL_TABLET | Freq: Every day | ORAL | Status: DC

## 2012-03-20 NOTE — Telephone Encounter (Signed)
CHECKING PERCERT FOR CATH

## 2012-03-20 NOTE — Assessment & Plan Note (Addendum)
Three-month history as outlined above. Baseline risk factors include long-standing hypertension, untreated hyperlipidemia. Recent Myoview results are abnormal as discussed. Plan is to proceed with an outpatient diagnostic cardiac catheterization with limited contrast (no ventriculogram) to assess for possible revascularization options. Risks and benefits were discussed, and she is in agreement to proceed. Followup lab work pending. We are arranging her catheterization for late morning to early afternoon so that she can be hydrated preprocedure. Plan to hold both Lisinopril and Bumex for the procedure. We are starting aspirin, p.r.n. Nitroglycerin, and low-dose Toprol-XL.

## 2012-03-20 NOTE — Patient Instructions (Addendum)
Your physician has requested that you have a cardiac catheterization Friday July 26th 2013 @12 :30 pm. Cardiac catheterization is used to diagnose and/or treat various heart conditions. Doctors may recommend this procedure for a number of different reasons. The most common reason is to evaluate chest pain. Chest pain can be a symptom of coronary artery disease (CAD), and cardiac catheterization can show whether plaque is narrowing or blocking your heart's arteries. This procedure is also used to evaluate the valves, as well as measure the blood flow and oxygen levels in different parts of your heart. For further information please visit [facility]. Please follow instruction sheet, as given. Your physician has recommended you make the following change in your medication: start Toprol XL 25 mg daily. Start Aspirin 81 mg daily.You have been given a new prescription for nitroglycerin to use as needed for chest pain.  Your physician recommends that you return for lab work in: [facility] FOR PRE CATH LAB WORK. A chest x-ray takes a picture of the organs and structures inside the chest, including the heart, lungs, and blood vessels. This test can show several things, including, whether the heart is enlarges; whether fluid is building up in the lungs; and whether pacemaker / defibrillator leads are still in place. PLEASE HAVE YOUR CHEST X-RAY DONE TODAY AT [facility].

## 2012-03-20 NOTE — Assessment & Plan Note (Signed)
CKD, appears to be stage III. Last creatinine 1.7 which was stable. Followup labs pending. As noted, risk of contrast nephropathy was discussed. She will be hydrated preprocedure. Plan to hold Bumex and lisinopril.

## 2012-03-20 NOTE — Progress Notes (Signed)
Clinical Summary Julia Santana is a 71 y.o.female referred by Julia Santana from [place] secondary to recent abnormal Myoview study. She describes a three-month history of progressive chest pain described as a heaviness, associated with increased levels of activity such as walking uphill or up steps. More recently she has been experiencing symptoms at night that wake her up.  She was referred for a Lexiscan Myoview on 7/23 that demonstrated abnormal ST segment depression of 1.5-2 mm in the inferior leads, perfusion imaging demonstrating moderate anteroseptal and apical partially reversible defects concerning for ischemia and possible scar, although breast attenuation was also noted. LVEF was 60% and the TID ratio was borderline at 1.18.  She has a known history of chronic kidney disease, asymptomatic, and on an ACE inhibitor at baseline. Lab work from April of this year showed hemoglobin 10.1, platelets 189, sodium 134, potassium 4.4, BUN 29, creatinine 1.7. Previous lipid profile showed cholesterol 254, triglycerides 199, HDL 38, LDL 176. She reports not being able to tolerate Crestor or Lipitor. Presently not on any statin therapy.  We discussed her symptoms, concerning for angina, and also her recent Myoview suggesting underlying obstructive CAD. We discussed the rationale, risks and benefits of diagnostic cardiac catheterization to assess for revascularization options. We also reviewed her risk of contrast-induced nephropathy. After considering these issues, she is in agreement to proceed.  Allergies  Allergen Reactions  . Losartan Potassium     Unsure of what happened when she took this  . Furosemide Cough    Current Outpatient Prescriptions  Medication Sig Dispense Refill  . aspirin EC 81 MG tablet Take 1 tablet (81 mg total) by mouth daily.  30 tablet  6  . budesonide-formoterol (SYMBICORT) 160-4.5 MCG/ACT inhaler Inhale 2 puffs into the lungs as needed. Shortness of  breath      . bumetanide (BUMEX) 2 MG tablet Take 2 mg by mouth daily.      Marland Kitchen lisinopril (PRINIVIL,ZESTRIL) 20 MG tablet Take 20 mg by mouth daily.      . metoprolol succinate (TOPROL-XL) 25 MG 24 hr tablet Take 1 tablet (25 mg total) by mouth daily.  30 tablet  6  . nitroGLYCERIN (NITROSTAT) 0.4 MG SL tablet Place 1 tablet (0.4 mg total) under the tongue every 5 (five) minutes as needed.  25 tablet  3  . DISCONTD: metoprolol succinate (TOPROL-XL) 25 MG 24 hr tablet Take 25 mg by mouth daily.      Marland Kitchen DISCONTD: nitroGLYCERIN (NITROSTAT) 0.4 MG SL tablet Place 0.4 mg under the tongue every 5 (five) minutes as needed.        Past Medical History  Diagnosis Date  . Renal insufficiency   . Essential hypertension, benign   . Asthma   . Mixed hyperlipidemia   . Hyperparathyroidism   . Seasonal allergies   . Sleep apnea     STOP BANG score = 4    Past Surgical History  Procedure Date  . Parathyroidectomy 12/15/2011    Procedure: PARATHYROIDECTOMY;  Surgeon: Jamesetta So, MD;  Location: [facility];  Service: General;  Laterality: Bilateral;    Family History  Problem Relation Age of Onset  . Hypertension Mother   . Diabetes type II Mother   . Diabetes type II Sister   . Diabetes type II Brother     Social History Julia Santana reports that she quit smoking about 31 years ago. Her smoking use included Cigarettes. She has a 20 pack-year smoking history. She has never  used smokeless tobacco. Julia Santana reports that she does not drink alcohol.  Review of Systems No palpitations or syncope. No reported bleeding problems. No claudication. No cough, fevers or chills. No orthopnea or PND. Otherwise negative.  Physical Examination Filed Vitals:   03/20/12 0808  BP: 157/83  Pulse: 79   Overweight woman in no acute distress. HEENT: Conjunctiva and lids normal, oropharynx clear with moist mucosa. Neck: Supple, no elevated JVP or carotid bruits, no thyromegaly. Lungs: Clear to auscultation,  nonlabored breathing at rest. Cardiac: Regular rate and rhythm, no S3, soft systolic murmur, no pericardial rub. Abdomen: Soft, nontender, bowel sounds present, no guarding or rebound. Extremities: Trace edema, distal pulses 2+. Skin: Warm and dry. Musculoskeletal: No kyphosis. Neuropsychiatric: Alert and oriented x3, affect grossly appropriate.   ECG Recent tracing reviewed showing sinus rhythm with decreased R-wave progression.   Problem List and Plan   Accelerating angina Three-month history as outlined above. Baseline risk factors include long-standing hypertension, untreated hyperlipidemia. Recent Myoview results are abnormal as discussed. Plan is to proceed with an outpatient diagnostic cardiac catheterization with limited contrast (no ventriculogram) to assess for possible revascularization options. Risks and benefits were discussed, and she is in agreement to proceed. Followup lab work pending. We are arranging her catheterization for late morning to early afternoon so that she can be hydrated preprocedure. Plan to hold both Lisinopril and Bumex for the procedure. We are starting aspirin, p.r.n. Nitroglycerin, and low-dose Toprol-XL.  Abnormal cardiovascular function study Abnormal ST segment depression of 1.5-2 mm in the inferior leads, perfusion imaging demonstrating moderate anteroseptal and apical partially reversible defects concerning for ischemia and possible scar, although breast attenuation was also noted. LVEF was 60% and the TID ratio was borderline at 1.18.  HYPERTENSION Patient reports 40 year history.  Mixed hyperlipidemia Last LDL 176. Reports intolerance to Crestor and Lipitor. This will need to be revisited, particularly if she has definitive obstructive CAD. We can consider a different statin preparation.  Renal insufficiency CKD, appears to be stage III. Last creatinine 1.7 which was stable. Followup labs pending. As noted, risk of contrast nephropathy was  discussed. She will be hydrated preprocedure. Plan to hold Bumex and lisinopril.    Satira Sark, M.D., F.A.C.C.

## 2012-03-20 NOTE — Telephone Encounter (Signed)
No precert required.  [place]

## 2012-03-20 NOTE — Assessment & Plan Note (Signed)
Last LDL 176. Reports intolerance to Crestor and Lipitor. This will need to be revisited, particularly if she has definitive obstructive CAD. We can consider a different statin preparation.

## 2012-03-20 NOTE — Assessment & Plan Note (Signed)
Patient reports 40 year history.

## 2012-03-20 NOTE — Assessment & Plan Note (Signed)
Abnormal ST segment depression of 1.5-2 mm in the inferior leads, perfusion imaging demonstrating moderate anteroseptal and apical partially reversible defects concerning for ischemia and possible scar, although breast attenuation was also noted. LVEF was 60% and the TID ratio was borderline at 1.18.

## 2012-03-22 ENCOUNTER — Inpatient Hospital Stay (HOSPITAL_COMMUNITY)
Admission: AD | Admit: 2012-03-22 | Discharge: 2012-04-04 | DRG: 234 | Disposition: A | Discharge status: Home / Self Care | Payer: Medicare Other | Source: Ambulatory Visit | Attending: Cardiothoracic Surgery | Admitting: Cardiothoracic Surgery

## 2012-03-22 ENCOUNTER — Inpatient Hospital Stay (HOSPITAL_BASED_OUTPATIENT_CLINIC_OR_DEPARTMENT_OTHER)
Admission: RE | Admit: 2012-03-22 | Discharge: 2012-03-22 | Disposition: A | Discharge status: Hospice | Payer: Medicare Other | Source: Ambulatory Visit | Attending: Cardiology | Admitting: Cardiology

## 2012-03-22 ENCOUNTER — Encounter (HOSPITAL_BASED_OUTPATIENT_CLINIC_OR_DEPARTMENT_OTHER)
Admission: RE | Disposition: A | Discharge status: Hospice | Payer: Self-pay | Source: Ambulatory Visit | Attending: Cardiology

## 2012-03-22 ENCOUNTER — Encounter (HOSPITAL_COMMUNITY): Payer: Medicare Other

## 2012-03-22 ENCOUNTER — Other Ambulatory Visit: Payer: Self-pay

## 2012-03-22 DIAGNOSIS — I2510 Atherosclerotic heart disease of native coronary artery without angina pectoris: Secondary | ICD-10-CM | POA: Diagnosis present

## 2012-03-22 DIAGNOSIS — E782 Mixed hyperlipidemia: Secondary | ICD-10-CM | POA: Diagnosis present

## 2012-03-22 DIAGNOSIS — G4733 Obstructive sleep apnea (adult) (pediatric): Secondary | ICD-10-CM | POA: Diagnosis present

## 2012-03-22 DIAGNOSIS — N183 Chronic kidney disease, stage 3 unspecified: Secondary | ICD-10-CM | POA: Diagnosis present

## 2012-03-22 DIAGNOSIS — N289 Disorder of kidney and ureter, unspecified: Secondary | ICD-10-CM

## 2012-03-22 DIAGNOSIS — I10 Essential (primary) hypertension: Secondary | ICD-10-CM | POA: Diagnosis present

## 2012-03-22 DIAGNOSIS — D62 Acute posthemorrhagic anemia: Secondary | ICD-10-CM | POA: Diagnosis not present

## 2012-03-22 DIAGNOSIS — E785 Hyperlipidemia, unspecified: Secondary | ICD-10-CM | POA: Insufficient documentation

## 2012-03-22 DIAGNOSIS — G4730 Sleep apnea, unspecified: Secondary | ICD-10-CM | POA: Insufficient documentation

## 2012-03-22 DIAGNOSIS — I129 Hypertensive chronic kidney disease with stage 1 through stage 4 chronic kidney disease, or unspecified chronic kidney disease: Secondary | ICD-10-CM | POA: Diagnosis present

## 2012-03-22 DIAGNOSIS — Z87891 Personal history of nicotine dependence: Secondary | ICD-10-CM

## 2012-03-22 DIAGNOSIS — E212 Other hyperparathyroidism: Secondary | ICD-10-CM | POA: Diagnosis present

## 2012-03-22 DIAGNOSIS — I200 Unstable angina: Secondary | ICD-10-CM

## 2012-03-22 DIAGNOSIS — I319 Disease of pericardium, unspecified: Secondary | ICD-10-CM | POA: Diagnosis not present

## 2012-03-22 DIAGNOSIS — Z7982 Long term (current) use of aspirin: Secondary | ICD-10-CM

## 2012-03-22 DIAGNOSIS — Z951 Presence of aortocoronary bypass graft: Secondary | ICD-10-CM

## 2012-03-22 DIAGNOSIS — E213 Hyperparathyroidism, unspecified: Secondary | ICD-10-CM | POA: Diagnosis present

## 2012-03-22 DIAGNOSIS — I214 Non-ST elevation (NSTEMI) myocardial infarction: Principal | ICD-10-CM | POA: Diagnosis present

## 2012-03-22 LAB — CBC
HCT: 31.3 % — ABNORMAL LOW (ref 36.0–46.0)
Hemoglobin: 10.4 g/dL — ABNORMAL LOW (ref 12.0–15.0)
MCHC: 33.2 g/dL (ref 30.0–36.0)
RBC: 3.64 MIL/uL — ABNORMAL LOW (ref 3.87–5.11)
WBC: 5.2 10*3/uL (ref 4.0–10.5)

## 2012-03-22 LAB — CREATININE, SERUM
GFR calc Af Amer: 45 mL/min — ABNORMAL LOW (ref >90)
GFR calc non Af Amer: 38 mL/min — ABNORMAL LOW (ref >90)

## 2012-03-22 SURGERY — JV LEFT HEART CATHETERIZATION WITH CORONARY ANGIOGRAM
Anesthesia: Moderate Sedation

## 2012-03-22 MED ORDER — ZOLPIDEM TARTRATE 5 MG PO TABS: 5.0000 mg | ORAL_TABLET | Freq: Every evening | ORAL | Status: DC | PRN

## 2012-03-22 MED ORDER — ENOXAPARIN SODIUM 40 MG/0.4ML ~~LOC~~ SOLN
40.0000 mg | SUBCUTANEOUS | Status: DC
  Administered 2012-03-23 – 2012-03-27 (×6): 40 mg via SUBCUTANEOUS
  Filled 2012-03-22 (×8): qty 0.4

## 2012-03-22 MED ORDER — SODIUM CHLORIDE 0.9 % IV SOLN: INTRAVENOUS | Status: DC

## 2012-03-22 MED ORDER — DIAZEPAM 5 MG PO TABS
5.0000 mg | ORAL_TABLET | ORAL | Status: AC
  Administered 2012-03-22: 5 mg via ORAL

## 2012-03-22 MED ORDER — HYDROCODONE-ACETAMINOPHEN 5-325 MG PO TABS: 1.0000 | ORAL_TABLET | Freq: Four times a day (QID) | ORAL | Status: DC | PRN

## 2012-03-22 MED ORDER — BUDESONIDE-FORMOTEROL FUMARATE 160-4.5 MCG/ACT IN AERO
2.0000 | INHALATION_SPRAY | RESPIRATORY_TRACT | Status: DC | PRN
  Filled 2012-03-22: qty 6

## 2012-03-22 MED ORDER — ASPIRIN EC 81 MG PO TBEC
81.0000 mg | DELAYED_RELEASE_TABLET | Freq: Every day | ORAL | Status: DC
  Administered 2012-03-23 – 2012-03-27 (×5): 81 mg via ORAL
  Filled 2012-03-22 (×8): qty 1

## 2012-03-22 MED ORDER — ONDANSETRON HCL 4 MG/2ML IJ SOLN: 4.0000 mg | Freq: Four times a day (QID) | INTRAMUSCULAR | Status: DC | PRN

## 2012-03-22 MED ORDER — METOPROLOL SUCCINATE ER 25 MG PO TB24
25.0000 mg | ORAL_TABLET | Freq: Every day | ORAL | Status: DC
  Administered 2012-03-23: 25 mg via ORAL
  Filled 2012-03-22 (×2): qty 1

## 2012-03-22 MED ORDER — ACETAMINOPHEN 325 MG PO TABS
650.0000 mg | ORAL_TABLET | ORAL | Status: DC | PRN
  Administered 2012-03-24 – 2012-03-27 (×6): 650 mg via ORAL
  Filled 2012-03-22 (×6): qty 2

## 2012-03-22 MED ORDER — ASPIRIN 81 MG PO CHEW: 324.0000 mg | CHEWABLE_TABLET | ORAL | Status: DC

## 2012-03-22 MED ORDER — PANTOPRAZOLE SODIUM 40 MG PO TBEC
40.0000 mg | DELAYED_RELEASE_TABLET | Freq: Every day | ORAL | Status: DC
  Administered 2012-03-23 – 2012-03-27 (×5): 40 mg via ORAL

## 2012-03-22 MED ORDER — ALPRAZOLAM 0.25 MG PO TABS: 0.2500 mg | ORAL_TABLET | Freq: Two times a day (BID) | ORAL | Status: DC | PRN

## 2012-03-22 MED ORDER — NITROGLYCERIN 0.4 MG SL SUBL
0.4000 mg | SUBLINGUAL_TABLET | SUBLINGUAL | Status: DC | PRN
  Administered 2012-03-23: 0.4 mg via SUBLINGUAL
  Filled 2012-03-22: qty 25

## 2012-03-22 MED ORDER — SODIUM CHLORIDE 0.9 % IV SOLN
INTRAVENOUS | Status: DC
  Administered 2012-03-23 – 2012-03-24 (×3): via INTRAVENOUS

## 2012-03-22 MED ORDER — ACETAMINOPHEN 325 MG PO TABS: 650.0000 mg | ORAL_TABLET | ORAL | Status: DC | PRN

## 2012-03-22 NOTE — Progress Notes (Signed)
PM  Patient stable.  No major pain at this point.  Had pain yesterday and earlier this am.  If more, will need to start IV heparin.  I do not feel she should go home before CABG.  TCTS was called regarding referral by [facility] cath lab staff.  Groin ok.

## 2012-03-22 NOTE — Interval H&P Note (Signed)
History and Physical Interval Note:  03/22/2012 12:44 PM  Julia Santana  has presented today for surgery, with the diagnosis of cp  The various methods of treatment have been discussed with the patient and family. After consideration of risks, benefits and other options for treatment, the patient has consented to  Procedure(s) (LRB): JV LEFT HEART CATHETERIZATION WITH CORONARY ANGIOGRAM (N/A) as a surgical intervention .  The patient's history has been reviewed, patient examined, no change in status, stable for surgery.  I have reviewed the patient's chart and labs.  Questions were answered to the patient's satisfaction.     Bing Quarry

## 2012-03-22 NOTE — Progress Notes (Signed)
Bedrest begins @ 1340.  Tegaderm dressing applied to right groin site by Suella Broad.  Right groin level 0.

## 2012-03-22 NOTE — H&P (Signed)
Clinical Summary  Julia Santana is a 71 y.o.female referred by Julia Santana from [place] secondary to recent abnormal Myoview study. She describes a three-month history of progressive chest pain described as a heaviness, associated with increased levels of activity such as walking uphill or up steps. More recently she has been experiencing symptoms at night that wake her up.  She was referred for a Lexiscan Myoview on 7/23 that demonstrated abnormal ST segment depression of 1.5-2 mm in the inferior leads, perfusion imaging demonstrating moderate anteroseptal and apical partially reversible defects concerning for ischemia and possible scar, although breast attenuation was also noted. LVEF was 60% and the TID ratio was borderline at 1.18.  She has a known history of chronic kidney disease, asymptomatic, and on an ACE inhibitor at baseline. Lab work from April of this year showed hemoglobin 10.1, platelets 189, sodium 134, potassium 4.4, BUN 29, creatinine 1.7. Previous lipid profile showed cholesterol 254, triglycerides 199, HDL 38, LDL 176. She reports not being able to tolerate Crestor or Lipitor. Presently not on any statin therapy.  We discussed her symptoms, concerning for angina, and also her recent Myoview suggesting underlying obstructive CAD. We discussed the rationale, risks and benefits of diagnostic cardiac catheterization to assess for revascularization options. We also reviewed her risk of contrast-induced nephropathy. After considering these issues, she is in agreement to proceed.  Allergies   Allergen  Reactions   .  Losartan Potassium      Unsure of what happened when she took this   .  Furosemide  Cough    Current Outpatient Prescriptions   Medication  Sig  Dispense  Refill   .  aspirin EC 81 MG tablet  Take 1 tablet (81 mg total) by mouth daily.  30 tablet  6   .  budesonide-formoterol (SYMBICORT) 160-4.5 MCG/ACT inhaler  Inhale 2 puffs into the lungs as needed.  Shortness of breath     .  bumetanide (BUMEX) 2 MG tablet  Take 2 mg by mouth daily.     Marland Kitchen  lisinopril (PRINIVIL,ZESTRIL) 20 MG tablet  Take 20 mg by mouth daily.     .  metoprolol succinate (TOPROL-XL) 25 MG 24 hr tablet  Take 1 tablet (25 mg total) by mouth daily.  30 tablet  6   .  nitroGLYCERIN (NITROSTAT) 0.4 MG SL tablet  Place 1 tablet (0.4 mg total) under the tongue every 5 (five) minutes as needed.  25 tablet  3   .  DISCONTD: metoprolol succinate (TOPROL-XL) 25 MG 24 hr tablet  Take 25 mg by mouth daily.     Marland Kitchen  DISCONTD: nitroGLYCERIN (NITROSTAT) 0.4 MG SL tablet  Place 0.4 mg under the tongue every 5 (five) minutes as needed.      Past Medical History   Diagnosis  Date   .  Renal insufficiency    .  Essential hypertension, benign    .  Asthma    .  Mixed hyperlipidemia    .  Hyperparathyroidism    .  Seasonal allergies    .  Sleep apnea      STOP BANG score = 4    Past Surgical History   Procedure  Date   .  Parathyroidectomy  12/15/2011     Procedure: PARATHYROIDECTOMY; Surgeon: Jamesetta So, MD; Location: [facility]; Service: General; Laterality: Bilateral;    Family History   Problem  Relation  Age of Onset   .  Hypertension  Mother    .  Diabetes type II  Mother    .  Diabetes type II  Sister    .  Diabetes type II  Brother     Social History  Julia Santana reports that she quit smoking about 31 years ago. Her smoking use included Cigarettes. She has a 20 pack-year smoking history. She has never used smokeless tobacco.  Julia Santana reports that she does not drink alcohol.  Review of Systems  No palpitations or syncope. No reported bleeding problems. No claudication. No cough, fevers or chills. No orthopnea or PND. Otherwise negative.  Physical Examination  Filed Vitals:    03/20/12 0808   BP:  157/83   Pulse:  79    Overweight woman in no acute distress.  HEENT: Conjunctiva and lids normal, oropharynx clear with moist mucosa.  Neck: Supple, no elevated JVP or  carotid bruits, no thyromegaly.  Lungs: Clear to auscultation, nonlabored breathing at rest.  Cardiac: Regular rate and rhythm, no S3, soft systolic murmur, no pericardial rub.  Abdomen: Soft, nontender, bowel sounds present, no guarding or rebound.  Extremities: Trace edema, distal pulses 2+.  Skin: Warm and dry.  Musculoskeletal: No kyphosis.  Neuropsychiatric: Alert and oriented x3, affect grossly appropriate.  ECG  Recent tracing reviewed showing sinus rhythm with decreased R-wave progression.   Problem List and Plan   Accelerating angina  Three-month history as outlined above. Baseline risk factors include long-standing hypertension, untreated hyperlipidemia. Recent Myoview results are abnormal as discussed. Plan is to proceed with an outpatient diagnostic cardiac catheterization with limited contrast (no ventriculogram) to assess for possible revascularization options. Risks and benefits were discussed, and she is in agreement to proceed. Followup lab work pending. We are arranging her catheterization for late morning to early afternoon so that she can be hydrated preprocedure. Plan to hold both Lisinopril and Bumex for the procedure. We are starting aspirin, p.r.n. Nitroglycerin, and low-dose Toprol-XL.   Abnormal cardiovascular function study  Abnormal ST segment depression of 1.5-2 mm in the inferior leads, perfusion imaging demonstrating moderate anteroseptal and apical partially reversible defects concerning for ischemia and possible scar, although breast attenuation was also noted. LVEF was 60% and the TID ratio was borderline at 1.18.   HYPERTENSION  Patient reports 40 year history.   Mixed hyperlipidemia  Last LDL 176. Reports intolerance to Crestor and Lipitor. This will need to be revisited, particularly if she has definitive obstructive CAD. We can consider a different statin preparation.   Renal insufficiency  CKD, appears to be stage III. Last creatinine 1.7 which was  stable. Followup labs pending. As noted, risk of contrast nephropathy was discussed. She will be hydrated preprocedure. Plan to hold Bumex and lisinopril.   Satira Sark, M.D., F.A.C.C.   Addendum: Cardiac cath: Left mainstem: calcified but no obstruction  Left anterior descending (LAD): Ostial hazy narrowing 80-90% with diffuse disease and tandem lesions of 90% prior to the major septal. There is 80% narrowing after the major diagonal. The diagonal has ostial and proximal 90% and severe disease. The distal LAD has a 80% lesion, with some diffuse disease, but does appear graftable.  Left circumflex (LCx): 50% ostial then 80% prior to the OM. The OM1 has 90% proximal narrowing then trifurcates to three smaller branches distally. The trifurcation vessels all have some proximal plaque. The area leading into the OM2 also has 90% narrowing. The distal AV circ is small. There are also distal collaterals to a small PDA.  Right coronary artery (RCA): The RCA is  smaller with diffuse proximal disease, then there is total occlusion just beyond on RV branch. The distal vessel lights in late by collaterals from the both the left and right, and appears to be small.   Dr McDowell's note 7/24 is above. Ms Santana came to the hospital for a JV cath on 03/22/2012. Limited contrast was used. Results are listed. She is to be admitted and hydrated. Renal function will be followed closely. TCTS consult will be obtained.  Julia Santana 03/22/2012 2:00 pm

## 2012-03-22 NOTE — Progress Notes (Signed)
I have reviewed her data and discussed the case with the family.  She has some renal insufficiency.  GFR is 41cc.  She has given a bolus of normal saline of 300cc prior to the procedure.  We will check LVEDP to guide further IVF administration.  Cor angio will be performed with limited contrast, and the patient is well aware of her renal status, and the goals for her procedure.  She understands that PCI will not be included in the procedure.

## 2012-03-22 NOTE — H&P (View-Only) (Signed)
Clinical Summary Ms. Rancourt is a 71 y.o.female referred by Ms. Skillman from [place] secondary to recent abnormal Myoview study. She describes a three-month history of progressive chest pain described as a heaviness, associated with increased levels of activity such as walking uphill or up steps. More recently she has been experiencing symptoms at night that wake her up.  She was referred for a Lexiscan Myoview on 7/23 that demonstrated abnormal ST segment depression of 1.5-2 mm in the inferior leads, perfusion imaging demonstrating moderate anteroseptal and apical partially reversible defects concerning for ischemia and possible scar, although breast attenuation was also noted. LVEF was 60% and the TID ratio was borderline at 1.18.  She has a known history of chronic kidney disease, asymptomatic, and on an ACE inhibitor at baseline. Lab work from April of this year showed hemoglobin 10.1, platelets 189, sodium 134, potassium 4.4, BUN 29, creatinine 1.7. Previous lipid profile showed cholesterol 254, triglycerides 199, HDL 38, LDL 176. She reports not being able to tolerate Crestor or Lipitor. Presently not on any statin therapy.  We discussed her symptoms, concerning for angina, and also her recent Myoview suggesting underlying obstructive CAD. We discussed the rationale, risks and benefits of diagnostic cardiac catheterization to assess for revascularization options. We also reviewed her risk of contrast-induced nephropathy. After considering these issues, she is in agreement to proceed.  Allergies  Allergen Reactions  . Losartan Potassium     Unsure of what happened when she took this  . Furosemide Cough    Current Outpatient Prescriptions  Medication Sig Dispense Refill  . aspirin EC 81 MG tablet Take 1 tablet (81 mg total) by mouth daily.  30 tablet  6  . budesonide-formoterol (SYMBICORT) 160-4.5 MCG/ACT inhaler Inhale 2 puffs into the lungs as needed. Shortness of  breath      . bumetanide (BUMEX) 2 MG tablet Take 2 mg by mouth daily.      Marland Kitchen lisinopril (PRINIVIL,ZESTRIL) 20 MG tablet Take 20 mg by mouth daily.      . metoprolol succinate (TOPROL-XL) 25 MG 24 hr tablet Take 1 tablet (25 mg total) by mouth daily.  30 tablet  6  . nitroGLYCERIN (NITROSTAT) 0.4 MG SL tablet Place 1 tablet (0.4 mg total) under the tongue every 5 (five) minutes as needed.  25 tablet  3  . DISCONTD: metoprolol succinate (TOPROL-XL) 25 MG 24 hr tablet Take 25 mg by mouth daily.      Marland Kitchen DISCONTD: nitroGLYCERIN (NITROSTAT) 0.4 MG SL tablet Place 0.4 mg under the tongue every 5 (five) minutes as needed.        Past Medical History  Diagnosis Date  . Renal insufficiency   . Essential hypertension, benign   . Asthma   . Mixed hyperlipidemia   . Hyperparathyroidism   . Seasonal allergies   . Sleep apnea     STOP BANG score = 4    Past Surgical History  Procedure Date  . Parathyroidectomy 12/15/2011    Procedure: PARATHYROIDECTOMY;  Surgeon: Jamesetta So, MD;  Location: [facility];  Service: General;  Laterality: Bilateral;    Family History  Problem Relation Age of Onset  . Hypertension Mother   . Diabetes type II Mother   . Diabetes type II Sister   . Diabetes type II Brother     Social History Ms. Mcgowan reports that she quit smoking about 31 years ago. Her smoking use included Cigarettes. She has a 20 pack-year smoking history. She has never  used smokeless tobacco. Ms. Crenshaw reports that she does not drink alcohol.  Review of Systems No palpitations or syncope. No reported bleeding problems. No claudication. No cough, fevers or chills. No orthopnea or PND. Otherwise negative.  Physical Examination Filed Vitals:   03/20/12 0808  BP: 157/83  Pulse: 79   Overweight woman in no acute distress. HEENT: Conjunctiva and lids normal, oropharynx clear with moist mucosa. Neck: Supple, no elevated JVP or carotid bruits, no thyromegaly. Lungs: Clear to auscultation,  nonlabored breathing at rest. Cardiac: Regular rate and rhythm, no S3, soft systolic murmur, no pericardial rub. Abdomen: Soft, nontender, bowel sounds present, no guarding or rebound. Extremities: Trace edema, distal pulses 2+. Skin: Warm and dry. Musculoskeletal: No kyphosis. Neuropsychiatric: Alert and oriented x3, affect grossly appropriate.   ECG Recent tracing reviewed showing sinus rhythm with decreased R-wave progression.   Problem List and Plan   Accelerating angina Three-month history as outlined above. Baseline risk factors include long-standing hypertension, untreated hyperlipidemia. Recent Myoview results are abnormal as discussed. Plan is to proceed with an outpatient diagnostic cardiac catheterization with limited contrast (no ventriculogram) to assess for possible revascularization options. Risks and benefits were discussed, and she is in agreement to proceed. Followup lab work pending. We are arranging her catheterization for late morning to early afternoon so that she can be hydrated preprocedure. Plan to hold both Lisinopril and Bumex for the procedure. We are starting aspirin, p.r.n. Nitroglycerin, and low-dose Toprol-XL.  Abnormal cardiovascular function study Abnormal ST segment depression of 1.5-2 mm in the inferior leads, perfusion imaging demonstrating moderate anteroseptal and apical partially reversible defects concerning for ischemia and possible scar, although breast attenuation was also noted. LVEF was 60% and the TID ratio was borderline at 1.18.  HYPERTENSION Patient reports 40 year history.  Mixed hyperlipidemia Last LDL 176. Reports intolerance to Crestor and Lipitor. This will need to be revisited, particularly if she has definitive obstructive CAD. We can consider a different statin preparation.  Renal insufficiency CKD, appears to be stage III. Last creatinine 1.7 which was stable. Followup labs pending. As noted, risk of contrast nephropathy was  discussed. She will be hydrated preprocedure. Plan to hold Bumex and lisinopril.    Satira Sark, M.D., F.A.C.C.

## 2012-03-22 NOTE — CV Procedure (Signed)
   Cardiac Catheterization Procedure Note  Name: Julia Santana MRN: [id number] DOB: 1941/03/15  Procedure: Left Heart Cath, Selective Coronary Angiography, LV angiography  Indication: Patient with recurrent chest pain and positive nuclear imaging study.  She has CKD, stage 2.  Referred by Dr. Domenic Polite for cardiac cath study.     Procedural details: The right groin was prepped, draped, and anesthetized with 1% lidocaine. Using modified Seldinger technique, a 4 French sheath was introduced into the right femoral artery. Standard Judkins catheters were used for coronary angiography with limited contrast.  LV angio was not performed, but we did check LVEDP prior to the angiographic views to guide fluid therapy.  She also received 300 CC of NS as a bolus prior to cath.  . Catheter exchanges were performed over a guidewire. There were no immediate procedural complications. The patient was transferred to the post catheterization recovery area for further monitoring.  Procedural Findings: Hemodynamics:  AO 156/67 (102) LV 156/23   Coronary angiography: Coronary dominance: right  Left mainstem: calcified but no obstruction  Left anterior descending (LAD): Ostial hazy narrowing 80-90% with diffuse disease and tandem lesions of 90% prior to the major septal.  There is 80% narrowing after the major diagonal.  The diagonal has ostial and proximal 90% and severe disease.  The distal LAD has a 80% lesion, with some diffuse disease, but does appear graftable.     Left circumflex (LCx): 50% ostial then 80% prior to the OM.  The OM1 has 90% proximal narrowing then trifurcates to three smaller branches distally.  The trifurcation vessels all have some proximal plaque.  The area leading into the OM2 also has 90% narrowing.  The distal AV circ is small.  There are also distal collaterals to a small PDA.    Right coronary artery (RCA): The RCA is smaller with diffuse proximal disease, then there is total  occlusion just beyond on RV branch.  The distal vessel lights in late by collaterals from the both the left and right, and appears to be small.    Left ventriculography: not done  Final Conclusions:   1.  Severe three vessel CAD    Recommendations:  1.  Admit for observation due to CP last pm and earlier today with 3v CAD 2.  TCTS consult 3.  Gentle hydration 4.  2D echo.   Bing Quarry 03/22/2012, 1:36 PM

## 2012-03-22 NOTE — Progress Notes (Signed)
Report called to Susie on [place] via Biomedical scientist and monitor.

## 2012-03-23 DIAGNOSIS — N289 Disorder of kidney and ureter, unspecified: Secondary | ICD-10-CM

## 2012-03-23 DIAGNOSIS — E782 Mixed hyperlipidemia: Secondary | ICD-10-CM

## 2012-03-23 DIAGNOSIS — I10 Essential (primary) hypertension: Secondary | ICD-10-CM

## 2012-03-23 DIAGNOSIS — I2510 Atherosclerotic heart disease of native coronary artery without angina pectoris: Secondary | ICD-10-CM

## 2012-03-23 DIAGNOSIS — Z01810 Encounter for preprocedural cardiovascular examination: Secondary | ICD-10-CM

## 2012-03-23 DIAGNOSIS — I200 Unstable angina: Secondary | ICD-10-CM | POA: Diagnosis not present

## 2012-03-23 LAB — BASIC METABOLIC PANEL
Chloride: 107 mEq/L (ref 96–112)
GFR calc Af Amer: 49 mL/min — ABNORMAL LOW (ref >90)
GFR calc non Af Amer: 43 mL/min — ABNORMAL LOW (ref >90)
Potassium: 3.7 mEq/L (ref 3.5–5.1)
Sodium: 138 mEq/L (ref 135–145)

## 2012-03-23 LAB — SURGICAL PCR SCREEN
MRSA, PCR: NEGATIVE
Staphylococcus aureus: NEGATIVE

## 2012-03-23 MED ORDER — METOPROLOL SUCCINATE ER 50 MG PO TB24
50.0000 mg | ORAL_TABLET | Freq: Every day | ORAL | Status: DC
Start: 2012-03-24 — End: 2012-03-24
  Filled 2012-03-23: qty 1

## 2012-03-23 NOTE — Progress Notes (Signed)
   Pt with HTN, CRI, and OSA admitted with [place] and found to have severe 3 vessel disease by cath 03/22/12  SUBJECTIVE: The patient is doing well today.  At this time, she denies chest pain at rest but does note reports chest heaviness with exertion.  She denies shortness of breath or any new concerns.     Marland Kitchen aspirin EC  81 mg Oral Daily  . enoxaparin (LOVENOX) injection  40 mg Subcutaneous Q24H  . metoprolol succinate  25 mg Oral Daily  . pantoprazole  40 mg Oral Q1200      . sodium chloride 75 mL/hr at 03/23/12 0905  . DISCONTD: sodium chloride      OBJECTIVE: Physical Exam: Filed Vitals:   03/22/12 1800 03/22/12 1900 03/22/12 2200 03/23/12 0600  BP: 156/65 152/64 157/58 156/67  Pulse: 68 65 65 65  Temp:   97.8 F (36.6 C) 97.4 F (36.3 C)  Resp:   17 14  SpO2:   100% 98%    Intake/Output Summary (Last 24 hours) at 03/23/12 1151 Last data filed at 03/23/12 0800  Gross per 24 hour  Intake    360 ml  Output      0 ml  Net    360 ml    Telemetry reveals sinus rhythm  GEN- The patient is well appearing, alert and oriented x 3 today.   Head- normocephalic, atraumatic Eyes-  Sclera clear, conjunctiva pink Ears- hearing intact Oropharynx- clear Neck- supple, no JVP Lymph- no cervical lymphadenopathy Lungs- Clear to ausculation bilaterally, normal work of breathing Heart- Regular rate and rhythm, no murmurs, rubs or gallops, PMI not laterally displaced GI- soft, NT, ND, + BS Extremities- no clubbing, cyanosis, or edema   LABS: Basic Metabolic Panel:  Basename 03/23/12 0500 03/22/12 1629  NA 138 --  K 3.7 --  CL 107 --  CO2 23 --  GLUCOSE 90 --  BUN 22 --  CREATININE 1.24* 1.35*  CALCIUM 10.4 --  MG -- --  PHOS -- --   Liver Function Tests: No results found for this basename: AST:2,ALT:2,ALKPHOS:2,BILITOT:2,PROT:2,ALBUMIN:2 in the last 72 hours No results found for this basename: LIPASE:2,AMYLASE:2 in the last 72 hours CBC:  Basename 03/22/12 1629    WBC 5.2  NEUTROABS --  HGB 10.4*  HCT 31.3*  MCV 86.0  PLT 192   RADIOLOGY: No results found.  ASSESSMENT AND PLAN:   1. Unstable angina- cath reviewed with Dr Lia Foyer,  Reveals critical 3 vessel disease Per Dr Lia Foyer, pt should not go home before cabg.  I have spoken with Dr Roxy Manns this am.  He states that Dr Prescott Gum will see the patient this weekend in consultation. Continue current medical therapy.  Increase metoprolol.  IF she develops pain at rest, then we will add nitrates, heparin, and have TCTS see more urgently.  2. HTN- above goal Increase beta blocker Add nitrates if worsening chest pain  3. CRI- stable  4. HL- she will need a statin Check fasting lipids and liver profile  Thompson Grayer, MD 03/23/2012 11:51 AM

## 2012-03-23 NOTE — Progress Notes (Signed)
VASCULAR LAB PRELIMINARY  PRELIMINARY  PRELIMINARY  PRELIMINARY  Carotid Dopplers completed.    Preliminary report:  No ICA stenosis noted on the right.  There is 40-59% ICA stenosis, mid to upper end of scale, noted on the left.  Vertebral artery flow is antegrade.  Julia Santana, 03/23/2012, 6:12 PM

## 2012-03-23 NOTE — Progress Notes (Signed)
VASCULAR LAB PRELIMINARY  PRELIMINARY  PRELIMINARY  PRELIMINARY  Pre-op Cardiac Surgery  Carotid Findings:    Upper Extremity Right Left  Brachial Pressures 170 T 177 T  Radial Waveforms T T  Ulnar Waveforms T T  Palmar Arch (Allen's Test) * **   Findings:  *Doppler waveforms obliterate with ulnar and remain normal with radial compressions on the right.                    **Doppler waveforms remain normal with ulnar and radial compressions.    Lower  Extremity Right Left  Dorsalis Pedis    Anterior Tibial 170 T 173 T  Posterior Tibial 177 T 175 T  Ankle/Brachial Indices 1.0 0.99    Findings:  ABI is within normal limits bilaterally.   Julia Santana, [place], 03/23/2012, 9:15 AM

## 2012-03-23 NOTE — Progress Notes (Signed)
Received order. Please specify if ambulation is warranted pre-CABG. Thx. Will f/u for decision. Yves Dill CES, ACSM

## 2012-03-24 ENCOUNTER — Encounter (HOSPITAL_COMMUNITY): Payer: Self-pay | Admitting: *Deleted

## 2012-03-24 ENCOUNTER — Inpatient Hospital Stay (HOSPITAL_COMMUNITY): Payer: Medicare Other

## 2012-03-24 LAB — CBC
HCT: 29.6 % — ABNORMAL LOW (ref 36.0–46.0)
Hemoglobin: 9.7 g/dL — ABNORMAL LOW (ref 12.0–15.0)
MCH: 28.3 pg (ref 26.0–34.0)
MCHC: 32.8 g/dL (ref 30.0–36.0)
MCV: 86.3 fL (ref 78.0–100.0)
Platelets: 167 10*3/uL (ref 150–400)
RBC: 3.43 MIL/uL — ABNORMAL LOW (ref 3.87–5.11)
RDW: 14 % (ref 11.5–15.5)
WBC: 4.8 10*3/uL (ref 4.0–10.5)

## 2012-03-24 LAB — URINALYSIS, ROUTINE W REFLEX MICROSCOPIC
Bilirubin Urine: NEGATIVE
Glucose, UA: NEGATIVE mg/dL
Hgb urine dipstick: NEGATIVE
Ketones, ur: NEGATIVE mg/dL
Nitrite: NEGATIVE
Protein, ur: NEGATIVE mg/dL
Specific Gravity, Urine: 1.012 (ref 1.005–1.030)
Urobilinogen, UA: 0.2 mg/dL (ref 0.0–1.0)
pH: 6 (ref 5.0–8.0)

## 2012-03-24 LAB — URINE MICROSCOPIC-ADD ON

## 2012-03-24 LAB — COMPREHENSIVE METABOLIC PANEL
ALT: 9 U/L (ref 0–35)
Alkaline Phosphatase: 74 U/L (ref 39–117)
CO2: 21 mEq/L (ref 19–32)
Chloride: 109 mEq/L (ref 96–112)
GFR calc Af Amer: 47 mL/min — ABNORMAL LOW (ref >90)
GFR calc non Af Amer: 40 mL/min — ABNORMAL LOW (ref >90)
Glucose, Bld: 88 mg/dL (ref 70–99)
Potassium: 4 mEq/L (ref 3.5–5.1)
Sodium: 139 mEq/L (ref 135–145)
Total Bilirubin: 0.3 mg/dL (ref 0.3–1.2)

## 2012-03-24 LAB — LIPID PANEL
HDL: 35 mg/dL — ABNORMAL LOW (ref >39)
LDL Cholesterol: 132 mg/dL — ABNORMAL HIGH (ref 0–99)
Triglycerides: 134 mg/dL (ref <150)

## 2012-03-24 MED ORDER — NITROGLYCERIN 2 % TD OINT
1.0000 [in_us] | TOPICAL_OINTMENT | Freq: Four times a day (QID) | TRANSDERMAL | Status: DC
  Administered 2012-03-24 – 2012-03-28 (×13): 1 [in_us] via TOPICAL
  Filled 2012-03-24 (×2): qty 30

## 2012-03-24 MED ORDER — FLUTICASONE-SALMETEROL 250-50 MCG/DOSE IN AEPB
1.0000 | INHALATION_SPRAY | Freq: Two times a day (BID) | RESPIRATORY_TRACT | Status: DC
  Filled 2012-03-24 (×2): qty 14

## 2012-03-24 MED ORDER — LISINOPRIL 5 MG PO TABS
5.0000 mg | ORAL_TABLET | Freq: Every day | ORAL | Status: DC
  Filled 2012-03-24: qty 1

## 2012-03-24 MED ORDER — BUMETANIDE 1 MG PO TABS
1.0000 mg | ORAL_TABLET | Freq: Every day | ORAL | Status: DC
  Administered 2012-03-24 – 2012-03-25 (×2): 1 mg via ORAL
  Filled 2012-03-24 (×2): qty 1

## 2012-03-24 MED ORDER — METOPROLOL SUCCINATE ER 50 MG PO TB24
75.0000 mg | ORAL_TABLET | Freq: Every day | ORAL | Status: DC
  Administered 2012-03-24 – 2012-03-27 (×4): 75 mg via ORAL
  Filled 2012-03-24 (×6): qty 1

## 2012-03-24 MED ORDER — LISINOPRIL 10 MG PO TABS
10.0000 mg | ORAL_TABLET | Freq: Every day | ORAL | Status: DC
  Administered 2012-03-24 – 2012-03-27 (×4): 10 mg via ORAL
  Filled 2012-03-24 (×6): qty 1

## 2012-03-24 MED ORDER — PRAVASTATIN SODIUM 20 MG PO TABS
20.0000 mg | ORAL_TABLET | Freq: Every day | ORAL | Status: DC
  Administered 2012-03-24 – 2012-04-04 (×11): 20 mg via ORAL
  Filled 2012-03-24 (×13): qty 1

## 2012-03-24 MED ORDER — DIAZEPAM 5 MG PO TABS: 5.0000 mg | ORAL_TABLET | ORAL | Status: DC | PRN

## 2012-03-24 NOTE — Progress Notes (Signed)
Procedure(s) (LRB): CORONARY ARTERY BYPASS GRAFTING (CABG) (N/A)                      [address]            [place] [id number]          [phone]      Subjective: 71 year old Afro-American female with history of hypertension admitted with class IV angina progressive over the past few weeks. She was seen by Dr. Domenic Polite is an outpatient and a stress test was strongly positive. Cardiac catheterization by Dr. Hyman Hopes demonstrated severe multivessel coronary disease. A ventriculogram was not performed a 2 history of elevated creatinine. A 2-D echo is pending. The patient is currently stable on bedrest and when necessary nitroglycerin and prophylactic dose of Lovenox. She had one episode of chest pain last night and received a dose of morphine. Her EF by the Cardiolite scan was 55-60%.  Her past history is significant for hypercalcemia status post parathyroidectomy at [facility] in April this year. Her calcium remains elevated at 10.2 and a PTH intact level is pending She has a history of hypertension but no history of diabetes. No strong family history of CAD or CABG. No history of DVT or venous insufficiency. No prior history of thoracic trauma. She stopped smoking 20 years ago but does have some chronic bronchitis and asthma  Objective: Vital signs in last 24 hours: Temp:  [97.5 F (36.4 C)-98.5 F (36.9 C)] 97.8 F (36.6 C) (07/28 0525) Pulse Rate:  [66-72] 72  (07/28 0525) Cardiac Rhythm:  [-] Normal sinus rhythm (07/27 1940) Resp:  [15-17] 17  (07/28 0525) BP: (150-164)/(74-79) 162/74 mmHg (07/28 0525) SpO2:  [95 %-100 %] 100 % (07/28 0525) Weight:  [213 lb 13.5 oz (97 kg)] 213 lb 13.5 oz (97 kg) (07/28 0525)  Hemodynamic parameters for last 24 hours:   normal sinus rhythm  Intake/Output from previous day: 07/27 0701 - 07/28 0700 In: 1980 [P.O.:1080; I.V.:900] Out: 500 [Urine:500] Intake/Output this shift:    Exam Alert and comfortable Sunil wheeze in  the upper chest and neck Cardiac rhythm regular Extremities warm without venous insufficiency Neuro intact  Lab Results:  Basename 03/22/12 1629  WBC 5.2  HGB 10.4*  HCT 31.3*  PLT 192   BMET:  Basename 03/23/12 0500 03/22/12 1629  NA 138 --  K 3.7 --  CL 107 --  CO2 23 --  GLUCOSE 90 --  BUN 22 --  CREATININE 1.24* 1.35*  CALCIUM 10.4 --    PT/INR: No results found for this basename: LABPROT,INR in the last 72 hours ABG No results found for this basename: phart, pco2, po2, hco3, tco2, acidbasedef, o2sat   CBG (last 3)  No results found for this basename: GLUCAP:3 in the last 72 hours  Assessment/Plan: S/P Procedure(s) (LRB): CORONARY ARTERY BYPASS GRAFTING (CABG) (N/A) Plan multivessel bypass grafting later in the week for severe CAD, unstable angina Procedure benefits and expected recovery and risks discussed with patient and she agrees   LOS: 2 days    VAN TRIGT III,Ordell Prichett 03/24/2012

## 2012-03-24 NOTE — Progress Notes (Addendum)
   Pt with HTN, CRI, and OSA admitted with [place] and found to have severe 3 vessel disease by cath 03/22/12  SUBJECTIVE: The patient is doing well today.  At this time, she denies chest pain at rest but does note reports chest heaviness with exertion.  She denies shortness of breath or any new concerns.     Marland Kitchen aspirin EC  81 mg Oral Daily  . enoxaparin (LOVENOX) injection  40 mg Subcutaneous Q24H  . metoprolol succinate  75 mg Oral Daily  . nitroGLYCERIN  1 inch Topical Q6H  . pantoprazole  40 mg Oral Q1200  . DISCONTD: Fluticasone-Salmeterol  1 puff Inhalation BID  . DISCONTD: metoprolol succinate  25 mg Oral Daily  . DISCONTD: metoprolol succinate  50 mg Oral Daily      . sodium chloride 75 mL/hr at 03/23/12 1945    OBJECTIVE: Physical Exam: Filed Vitals:   03/23/12 0600 03/23/12 1400 03/23/12 2100 03/24/12 0525  BP: 156/67 164/76 150/79 162/74  Pulse: 65 67 66 72  Temp: 97.4 F (36.3 C) 97.5 F (36.4 C) 98.5 F (36.9 C) 97.8 F (36.6 C)  TempSrc:  Oral Oral   Resp: 14 16 15 17   Height:    5\' 6"  (1.676 m)  Weight:    213 lb 13.5 oz (97 kg)  SpO2: 98% 95% 99% 100%    Intake/Output Summary (Last 24 hours) at 03/24/12 0844 Last data filed at 03/23/12 1842  Gross per 24 hour  Intake   1620 ml  Output    500 ml  Net   1120 ml    Telemetry reveals sinus rhythm  GEN- The patient is well appearing, alert and oriented x 3 today.   Head- normocephalic, atraumatic Eyes-  Sclera clear, conjunctiva pink Ears- hearing intact Oropharynx- clear Neck- supple, no JVP Lymph- no cervical lymphadenopathy Lungs- Clear to ausculation bilaterally, normal work of breathing Heart- Regular rate and rhythm, no murmurs, rubs or gallops, PMI not laterally displaced GI- soft, NT, ND, + BS Extremities- no clubbing, cyanosis, or edema   LABS: Basic Metabolic Panel:  Basename 03/24/12 0635 03/23/12 0500  NA 139 138  K 4.0 3.7  CL 109 107  CO2 21 23  GLUCOSE 88 90  BUN 19 22    CREATININE 1.30* 1.24*  CALCIUM 10.4 10.4  MG -- --  PHOS -- --   Liver Function Tests:  [facility] 03/24/12 0635  AST 13  ALT 9  ALKPHOS 74  BILITOT 0.3  PROT 6.0  ALBUMIN 3.2*   No results found for this basename: LIPASE:2,AMYLASE:2 in the last 72 hours CBC:  Basename 03/24/12 0635 03/22/12 1629  WBC 4.8 5.2  NEUTROABS -- --  HGB 9.7* 10.4*  HCT 29.6* 31.3*  MCV 86.3 86.0  PLT 167 192   RADIOLOGY: No results found.  ASSESSMENT AND PLAN:   1. Unstable angina- cath reviewed with Dr Lia Foyer,  Reveals critical 3 vessel disease Per Dr Lia Foyer, pt should not go home before cabg. Dr Lucianne Lei Trigt's note is reviewed.  Pt anticipates surgery later this week. I will continue to titrated metoprolol. Add nitropaste.  2. HTN- above goal Increase beta blocker Add nitropaste Restart lisinopril and bumex (she states that she is presently taking this am home). Continue to adjust medicine as needed  3. CRI- stable  4. HL- she reports intolerance to crestor and lipitor We will try pravochol  Thompson Grayer, MD 03/24/2012 8:44 AM

## 2012-03-24 NOTE — Progress Notes (Signed)
Pt called at 2255 and stated she was having chest pain after going into bathroom. Stated pain was 10/10 to chest. BP 192/124 with HR 78. O2 nasal cannula at 4L applied and 12 lead EKG done and pt given 1 SL NTG. At 2302 BP 180/93 HR 69 and pain down to 4/10. At 2304 BP 179/84 and pain is 0/10. Pt states she feels much better. Some EKG changes noted in I, V4, V5, V6 as compared to EKG from April of 2013. Dr Posey Pronto on call for [facility] made aware of above. No new orders. Will continue to monitor closely.

## 2012-03-25 ENCOUNTER — Inpatient Hospital Stay (HOSPITAL_COMMUNITY): Payer: Medicare Other

## 2012-03-25 DIAGNOSIS — I200 Unstable angina: Secondary | ICD-10-CM

## 2012-03-25 DIAGNOSIS — I059 Rheumatic mitral valve disease, unspecified: Secondary | ICD-10-CM

## 2012-03-25 LAB — BLOOD GAS, ARTERIAL
Acid-base deficit: 2.2 mmol/L — ABNORMAL HIGH (ref 0.0–2.0)
Bicarbonate: 21.8 mEq/L (ref 20.0–24.0)
Drawn by: 143327
FIO2: 0.21 %
O2 Saturation: 97.4 %
Patient temperature: 98.6
TCO2: 22.9 mmol/L (ref 0–100)
pCO2 arterial: 35.5 mmHg (ref 35.0–45.0)
pH, Arterial: 7.405 (ref 7.350–7.450)
pO2, Arterial: 80.8 mmHg (ref 80.0–100.0)

## 2012-03-25 LAB — BASIC METABOLIC PANEL
BUN: 20 mg/dL (ref 6–23)
CO2: 23 mEq/L (ref 19–32)
Calcium: 10.2 mg/dL (ref 8.4–10.5)
Chloride: 109 mEq/L (ref 96–112)
Creatinine, Ser: 1.29 mg/dL — ABNORMAL HIGH (ref 0.50–1.10)
Glucose, Bld: 87 mg/dL (ref 70–99)

## 2012-03-25 LAB — HEMOGLOBIN A1C
Hgb A1c MFr Bld: 5.9 % — ABNORMAL HIGH (ref <5.7)
Mean Plasma Glucose: 123 mg/dL — ABNORMAL HIGH (ref <117)

## 2012-03-25 LAB — PTH, INTACT AND CALCIUM
Calcium, Total (PTH): 10.2 mg/dL (ref 8.4–10.5)
PTH: 195.2 pg/mL — ABNORMAL HIGH (ref 14.0–72.0)

## 2012-03-25 LAB — PULMONARY FUNCTION TEST

## 2012-03-25 MED ORDER — LEVALBUTEROL HCL 1.25 MG/0.5ML IN NEBU
1.2500 mg | INHALATION_SOLUTION | Freq: Three times a day (TID) | RESPIRATORY_TRACT | Status: DC
  Administered 2012-03-25 – 2012-03-26 (×2): 1.25 mg via RESPIRATORY_TRACT
  Filled 2012-03-25 (×5): qty 0.5

## 2012-03-25 MED ORDER — ALBUTEROL SULFATE (5 MG/ML) 0.5% IN NEBU
2.5000 mg | INHALATION_SOLUTION | Freq: Once | RESPIRATORY_TRACT | Status: AC
  Administered 2012-03-25: 2.5 mg via RESPIRATORY_TRACT
  Filled 2012-03-25: qty 0.5

## 2012-03-25 MED ORDER — BUMETANIDE 2 MG PO TABS
2.0000 mg | ORAL_TABLET | Freq: Every day | ORAL | Status: DC
  Filled 2012-03-25 (×2): qty 1

## 2012-03-25 NOTE — Progress Notes (Signed)
Noted pt has had CP with exertion. No activity orders given. Will not follow pt until after CABG unless ambulation is indicated by a re-order for Phase I Cardiac Rehab. Thx. Yves Dill CES, ACSM

## 2012-03-25 NOTE — Progress Notes (Signed)
Subjective:  Stable awaiting surgery.  Seen in Resp therapy while doing PFTs.  No further chest pain.  Stable.   Objective:  Vital Signs in the last 24 hours: Temp:  [97.5 F (36.4 C)-97.9 F (36.6 C)] 97.9 F (36.6 C) (07/29 2100) Pulse Rate:  [59-67] 61  (07/29 2100) Resp:  [16-18] 16  (07/29 2032) BP: (135-180)/(56-83) 180/83 mmHg (07/29 2100) SpO2:  [97 %-99 %] 99 % (07/29 2100)  Intake/Output from previous day: 07/28 0701 - 07/29 0700 In: 240 [P.O.:240] Out: -    Physical Exam: General: Well developed, well nourished, in no acute distress. Head:  Normocephalic and atraumatic. Lungs: Clear to auscultation and percussion. Heart: Normal S1 and S2.  No murmur, rubs or gallops.  Pulses: Pulses normal in all 4 extremities.  Cath Site looks good.  Bandaid in place Extremities: No clubbing or cyanosis. No edema. Neurologic: Alert and oriented x 3.    Lab Results:  [facility] 03/24/12 0635  WBC 4.8  HGB 9.7*  PLT 167    Basename 03/25/12 0605 03/24/12 0635  NA 139 139  K 3.8 4.0  CL 109 109  CO2 23 21  GLUCOSE 87 88  BUN 20 19  CREATININE 1.29* 1.30*   No results found for this basename: TROPONINI:2,CK,MB:2 in the last 72 hours Hepatic Function Panel  Basename 03/24/12 0635  PROT 6.0  ALBUMIN 3.2*  AST 13  ALT 9  ALKPHOS 74  BILITOT 0.3  BILIDIR --  IBILI --    Basename 03/24/12 0635  CHOL 194   No results found for this basename: PROTIME in the last 72 hours  Imaging: Dg Chest 2 View  03/24/2012  *RADIOLOGY REPORT*  Clinical Data: Preop for heart surgery.  CHEST - 2 VIEW  Comparison: 03/20/2012  Findings: Two views of the chest were obtained.  Heart size is within normal limits and stable.  Trachea is midline.  Lungs are clear without airspace disease, edema or pleural effusions.  Bony thorax is intact.  IMPRESSION: No acute chest findings.  Original Report Authenticated By: Markus Daft, M.D.     Assessment/Plan:  Patient Active Hospital Problem  List: Unstable angina (03/23/2012)   Assessment: Stable   Plan:awaiting CABG Primary hyperparathyroidism (02/25/2009)   Assessment:elevated PTH   Plan:review with endo       Bing Quarry, MD, [facility], FSCAI 03/25/2012, 10:12 PM

## 2012-03-25 NOTE — Progress Notes (Signed)
PFT done. Unconfirmed copy placed in shadow chart.  Philipp Deputy RRT, RCP 03/25/2012 12:34 PM

## 2012-03-25 NOTE — Consult Note (Signed)
[address]            [place] [address]          [phone]                       [address]            [place] [address]          [phone]       Mckensey H Breaker [place] Medical Record [id number] Date of Birth: 03/24/1941  No ref. provider found [facility] [place]  Chief Complaint:   No chief complaint on file.  chest pain-unstable angina History of Present Illness:     71 year old Afro-American female evaluated by Dr. Domenic Polite for exertional angina progressing to nocturnal chest pain. Myoview scan demonstrated ischemic changes and subsequent cardiac catheterization by Dr. Hyman Hopes demonstrates severe multivessel coronary disease. EF by scan was 60%. A ventriculogram was not performed at the time of cardiac catheterization. A 2-D echo has been done today. Surgical coronary revascularization has been recommended by the patient's cardiologist for release of angina elevation of LV function and improved long-term survival. The patient has had some chest pain relieved by IV morphine while at bedrest following cardiac catheterization. There is no history of arrhythmia heart murmur or heart valve disease. No symptoms of CHF.   The patient has a history of hypertension, hypercalcemia status post parathyroidectomy in April 2013 in [place], and elevated creatinine 1.3 baseline. The patient has not smoked in 20 years but has asthmatic bronchitis on inhalers.    Current Activity/ Functional Status: Good    Past Medical History  Diagnosis Date  . Renal insufficiency   . Essential hypertension, benign   . Asthma   . Mixed hyperlipidemia   . Hyperparathyroidism   . Seasonal allergies     Past Surgical History  Procedure Date  . Parathyroidectomy 12/15/2011    Procedure: PARATHYROIDECTOMY;  Surgeon: Jamesetta So, MD;  Location: [facility];  Service: General;  Laterality: Bilateral;    History  Smoking status  . Former  Smoker -- 1.0 packs/day for 20 years  . Types: Cigarettes  . Quit date: 08/28/1980  Smokeless tobacco  . Never Used  Comment: quit smoking about 25 years ago    History  Alcohol Use No    History   Social History  . Marital Status: Married    Spouse Name: SHERMAN    Number of Children: N/A  . Years of Education: N/A   Occupational History  . RETIRED    Social History Main Topics  . Smoking status: Former Smoker -- 1.0 packs/day for 20 years    Types: Cigarettes    Quit date: 08/28/1980  . Smokeless tobacco: Never Used   Comment: quit smoking about 25 years ago  . Alcohol Use: No  . Drug Use: No  . Sexually Active: Not on file   Other Topics Concern  . Not on file   Social History Narrative  . No narrative on file    Allergies  Allergen Reactions  . Losartan Potassium Other (See Comments)    Unsure of what happened when she took this  . Furosemide Cough    Current Facility-Administered Medications  Medication Dose Route Frequency Provider Last Rate Last Dose  . 0.9 %  sodium chloride infusion   Intravenous Continuous Suanne Marker  G Barrett, PA 75 mL/hr at 03/24/12 2143    . acetaminophen (TYLENOL) tablet 650 mg  650 mg Oral Q4H PRN Evelene Croon Barrett, PA   650 mg at 03/25/12 1719  . albuterol (PROVENTIL) (5 MG/ML) 0.5% nebulizer solution 2.5 mg  2.5 mg Nebulization Once Ivin Poot, MD   2.5 mg at 03/25/12 0837  . ALPRAZolam (XANAX) tablet 0.25 mg  0.25 mg Oral BID PRN Evelene Croon Barrett, PA      . aspirin EC tablet 81 mg  81 mg Oral Daily Evelene Croon Barrett, PA   81 mg at 03/25/12 0934  . budesonide-formoterol (SYMBICORT) 160-4.5 MCG/ACT inhaler 2 puff  2 puff Inhalation PRN Evelene Croon Barrett, PA      . bumetanide (BUMEX) tablet 1 mg  1 mg Oral Daily Thompson Grayer, MD   1 mg at 03/25/12 0933  . diazepam (VALIUM) tablet 5-10 mg  5-10 mg Oral Q4H PRN Ivin Poot, MD      . enoxaparin (LOVENOX) injection 40 mg  40 mg Subcutaneous Q24H Evelene Croon Barrett, PA   40 mg at  03/25/12 1715  . HYDROcodone-acetaminophen (NORCO/VICODIN) 5-325 MG per tablet 1-2 tablet  1-2 tablet Oral Q6H PRN Evelene Croon Barrett, PA      . lisinopril (PRINIVIL,ZESTRIL) tablet 10 mg  10 mg Oral Daily Thompson Grayer, MD   10 mg at 03/25/12 0933  . metoprolol succinate (TOPROL-XL) 24 hr tablet 75 mg  75 mg Oral Daily Thompson Grayer, MD   75 mg at 03/25/12 0934  . nitroGLYCERIN (NITROGLYN) 2 % ointment 1 inch  1 inch Topical Q6H Thompson Grayer, MD   1 inch at 03/25/12 1715  . nitroGLYCERIN (NITROSTAT) SL tablet 0.4 mg  0.4 mg Sublingual Q5 Min x 3 PRN Evelene Croon Barrett, PA   0.4 mg at 03/23/12 2257  . ondansetron (ZOFRAN) injection 4 mg  4 mg Intravenous Q6H PRN Rhonda G Barrett, PA      . pantoprazole (PROTONIX) EC tablet 40 mg  40 mg Oral Q1200 Rhonda G Barrett, PA   40 mg at 03/25/12 1157  . pravastatin (PRAVACHOL) tablet 20 mg  20 mg Oral Daily Thompson Grayer, MD   20 mg at 03/25/12 0933  . zolpidem (AMBIEN) tablet 5 mg  5 mg Oral QHS PRN Lonn Georgia, PA         Family History  Problem Relation Age of Onset  . Hypertension Mother   . Diabetes type II Mother   . Diabetes type II Sister   . Diabetes type II Brother      Review of Systems:     Cardiac Review of Systems: Y or N  Chest Pain [   yes   ]  Resting SOB [ no    ] Exertional SOB  [  ES]  Orthopnea [  ]   Pedal Edema [ yes    ]    Palpita no tions [  ] Syncope  [  no  ]   Presyncope [   ]  General Review of Systems: [Y] = yes [  ]=no Constitional: recent weight change [ no ]; anorexia [  ]; fatigu yese [  ]; nausea [  ]; night sweats [  ]; fever [ no  ]; or chills [  ];  Dental: poor dentition[  yes  ]; Last Dentist visit: none   Eye : blurred vision [  ]; diplopia [   ]; vision changes [  ];  Amaurosis fugax[  ]; Resp: cough [  ];  wheezing[ yes   ];  hemoptysis[  ]; shortness of breath[  ];  paroxysmal nocturnal dyspnea[ no   ]; dyspnea on exertion[   yes ]; or orthopnea[  ];  GI:  gallstones[  ], vomiting[  ];  dysphagia[  ]; melena[ no   ];  hematochezia [  ]; heartburn[yes   ];   Hx of  Colonoscopy[  ]; GU: kidney stones [  ]; hematuria[  ];   dysuria [  ];  nocturia[  no  ];  history of     obstruction [  ];                 Skin: rash, swelling[  ];, hair loss[  ];  peripheral edema[  ];  or itching[  ]; Musculosketetal: myalgias[  ];  joint swelling[  ];  joint erythema[  ];  joint pain[  ];  back pain[  ];  Heme/Lymph: bruising[  ];  bleeding[  ];  anemia[  ];  Neuro: TIA[  ];  headaches[  ];  stroke[ no ];  vertigo[  ];  seizures[  ];   paresthesias[  ];  difficulty walking[  ];  Psych:depression[  ]; anxiety[  ];  Endocrine: diabetes[  ];  thyroid dysfunction[  no  ];  Immunizations: Flu [  ]; Pneumococcal[  ];  Other: positive for sleep apnea   Physical Exam: BP 151/76  Pulse 67  Temp 97.5 F (36.4 C) (Oral)  Resp 16  Ht 5\' 6"  (1.676 m)  Wt 213 lb 13.5 oz (97 kg)  BMI 34.52 kg/m2  SpO2 99%  general obese middle-aged female no acute distress HEENT normocephalic pupils equal dentition poor   neck without JVD or bruit few wheezes present in neck   chest nontender no deformities no wheezes in the lung fields Cor- sinus rhythm soft flow murmur no S3 gallop Abdomen obese soft nontender Extremities no edema or tenderness no cyanosis Vascular palpable pedal pulses no venous insufficiency Neuro no motor weakness    Diagnostic Studies & Laboratory data:    coronary. Is reviewed with Dr. Hyman Hopes severe three-vessel disease. 2-D echo performed today to be reviewed   Recent Radiology Findings:   Dg Chest 2 View  03/24/2012  *RADIOLOGY REPORT*  Clinical Data: Preop for heart surgery.  CHEST - 2 VIEW  Comparison: 03/20/2012  Findings: Two views of the chest were obtained.  Heart size is within normal limits and stable.  Trachea is midline.  Lungs are clear without  airspace disease, edema or pleural effusions.  Bony thorax is intact.  IMPRESSION: No acute chest findings.  Original Report Authenticated By: Markus Daft, M.D.      Recent Lab Findings: Lab Results  Component Value Date   WBC 4.8 03/24/2012   HGB 9.7* 03/24/2012   HCT 29.6* 03/24/2012   PLT 167 03/24/2012   GLUCOSE 87 03/25/2012   CHOL 194 03/24/2012   TRIG 134 03/24/2012   HDL 35* 03/24/2012   LDLCALC 132* 03/24/2012   ALT 9 03/24/2012   AST 13 03/24/2012   NA 139 03/25/2012   K 3.8 03/25/2012   CL 109 03/25/2012   CREATININE 1.29* 03/25/2012   BUN 20 03/25/2012   CO2 23 03/25/2012   HGBA1C  5.9* 03/24/2012      Assessment . Plan multivessel bypass grafting for this 71 year old female with multivessel coronary disease and unstable angina. Surgery scheduled for August 1 Carotid Doppler show no surgical  Disease 2-D echo pending PFTs pending

## 2012-03-25 NOTE — Progress Notes (Signed)
  Echocardiogram 2D Echocardiogram has been performed.  Diamond Nickel 03/25/2012, 11:44 AM

## 2012-03-26 DIAGNOSIS — I2510 Atherosclerotic heart disease of native coronary artery without angina pectoris: Secondary | ICD-10-CM

## 2012-03-26 MED ORDER — LEVALBUTEROL HCL 1.25 MG/0.5ML IN NEBU
1.2500 mg | INHALATION_SOLUTION | Freq: Four times a day (QID) | RESPIRATORY_TRACT | Status: DC | PRN
  Filled 2012-03-26: qty 0.5

## 2012-03-26 MED ORDER — BUMETANIDE 2 MG PO TABS
2.0000 mg | ORAL_TABLET | Freq: Every day | ORAL | Status: DC
  Administered 2012-03-26 – 2012-03-27 (×2): 2 mg via ORAL
  Filled 2012-03-26 (×2): qty 1

## 2012-03-26 NOTE — Progress Notes (Signed)
UR Completed Breta Demedeiros Graves-Bigelow, RN,BSN [phone]  

## 2012-03-26 NOTE — Progress Notes (Signed)
Patient evaluated for long-term disease management services with [place] [facility] as a benefit of her [place]. She lives with her family and is fairly independent. However, she can benefit from [facility] services. Explained services at bedside with patient and consents signed. Patient will receive a post discharge transition of care call and monthly home visits for assessments and for education if needed.These services do not interfere or replace what is arranged by inpatient case management or social work. Contact information and [facility] Care Management literature left at bedside. Mrs Broadnax was appreciative of visit.      Marthenia Rolling, MSN-Ed, RN,BSN [facility] Liaison  [phone]

## 2012-03-26 NOTE — Care Management Note (Unsigned)
    Page 1 of 2   04/03/2012     2:01:57 PM   CARE MANAGEMENT NOTE 04/03/2012  Patient:  Julia Santana, Julia Santana   Account Number:  [id number]  Date Initiated:  03/26/2012  Documentation initiated by:  GRAVES-BIGELOW,BRENDA  Subjective/Objective Assessment:   Pt admitted with abnormal stress test and is s/p cath and found 3 vessel cad. Plan for CABG on Friday. Pt has family support.     Action/Plan:   CM will continue to f/u for disposition needs post CABG.   Anticipated DC Date:  04/04/2012   Anticipated DC Plan:  [place]  CM consult      Choice offered to / List presented to:     DME arranged  [place]      DME agency  [place] arranged  [facility]NAG[facility]      [place] agency  [place]   Status of service:  In process, will continue to follow Medicare Important Message given?   (If response is "NO", the following Medicare IM given date fields will be blank) Date Medicare IM given:   Date Additional Medicare IM given:    Discharge Disposition:  HOME/SELF CARE  Per UR Regulation:  Reviewed for med. necessity/level of care/duration of stay  If discussed at [place] of Stay Meetings, dates discussed:    Comments:  04/03/12 Maddox Hlavaty,RN,BSN [place]; REQUESTS RW AND 3 IN 1 FOR HOME. REFERRAL TO [facility] FOR DME NEEDS.  PT DENIES NEED FOR HHRN FOLLOW UP.  [place] LONG-TERM DISEASE MANAGEMENT.  04/02/12 Dusty Raczkowski,RN,BSN PT PROGRESSING WELL; AMBULATING WELL WITH CARDIAC REHAB. POSSIBLE DC 1-2 DAYS.  PT HAS RW AT HOME, IF NEEDED.

## 2012-03-26 NOTE — Progress Notes (Addendum)
Subjective:  Stable while awaiting surgery.  Has discomfort mainly with exertion.  Based on her anatomy, I would be uncomfortable sending her back to [facility].  LAD is severe and subtotally occluded.  No rest angina at present.  She does note a bit of LE edema.   Objective:  Vital Signs in the last 24 hours: Temp:  [97.5 F (36.4 C)-98 F (36.7 C)] 98 F (36.7 C) (07/30 0500) Pulse Rate:  [60-67] 67  (07/30 0500) Resp:  [16] 16  (07/29 2032) BP: (151-180)/(71-83) 154/71 mmHg (07/30 0500) SpO2:  [97 %-99 %] 99 % (07/30 0500) Weight:  [213 lb 13.5 oz (97 kg)] 213 lb 13.5 oz (97 kg) (07/30 0500)  Intake/Output from previous day: 07/29 0701 - 07/30 0700 In: 360 [P.O.:360] Out: -    Physical Exam: General: Well developed, well nourished, in no acute distress. Head:  Normocephalic and atraumatic. Lungs: Clear to auscultation and percussion. Heart: Normal S1 and S2.  No murmur, rubs or gallops.  Pulses: Pulses normal in all 4 extremities. Extremities: No clubbing or cyanosis. No edema. Neurologic: Alert and oriented x 3.    Lab Results:  [facility] 03/24/12 0635  WBC 4.8  HGB 9.7*  PLT 167    Basename 03/25/12 0605 03/24/12 0635  NA 139 139  K 3.8 4.0  CL 109 109  CO2 23 21  GLUCOSE 87 88  BUN 20 19  CREATININE 1.29* 1.30*   No results found for this basename: TROPONINI:2,CK,MB:2 in the last 72 hours Hepatic Function Panel  Basename 03/24/12 0635  PROT 6.0  ALBUMIN 3.2*  AST 13  ALT 9  ALKPHOS 74  BILITOT 0.3  BILIDIR --  IBILI --    Basename 03/24/12 0635  CHOL 194   No results found for this basename: PROTIME in the last 72 hours  Imaging: Study Conclusions  - Left ventricle: The cavity size was normal. Wall thickness was increased in a pattern of mild LVH. There was mild focal basal hypertrophy of the septum. Systolic function was normal. The estimated ejection fraction was in the range of 55% to 60%. Wall motion was normal; there were  no regional wall motion abnormalities. Doppler parameters are consistent with abnormal left ventricular relaxation (grade 1 diastolic dysfunction). - Mitral valve: Mild regurgitation. - Left atrium: The atrium was mildly dilated.      Assessment/Plan:  Patient Active Hospital Problem List: Unstable angina (03/23/2012)   Assessment: symptoms only with exertion   Plan: for CABG Primary hyperparathyroidism (02/25/2009)   Assessment: see labs   Plan:   HYPERTENSION (02/25/2009)   Assessment: somewhat higher   Plan: resume home meds.  Renal insufficiency (03/20/2012)   Assessment: ACE and diuretic were on hold   Plan: resume  For CABG Thursday.       Bing Quarry, MD, [facility], [place] 03/26/2012, 10:28 AM

## 2012-03-26 NOTE — Progress Notes (Signed)
Procedure(s) (LRB): CORONARY ARTERY BYPASS GRAFTING (CABG) (N/A) Subjective: Unstable angina with severe multivessel coronary disease, LVH with diastolic dysfunction Normal sinus rhythm Mild to moderate peripheral edema Pulmonary function tests show moderate to severe obstructive disease with FEV1 of 1 L and diffusion capacity at 50% of predicted  Objective: Vital signs in last 24 hours: Temp:  [97.9 F (36.6 C)-98.3 F (36.8 C)] 98.3 F (36.8 C) (07/30 1341) Pulse Rate:  [60-81] 81  (07/30 1341) Cardiac Rhythm:  [-] Normal sinus rhythm (07/30 0800) Resp:  [16-18] 18  (07/30 1341) BP: (140-180)/(71-83) 140/80 mmHg (07/30 1341) SpO2:  [97 %-99 %] 97 % (07/30 1341) Weight:  [213 lb 13.5 oz (97 kg)] 213 lb 13.5 oz (97 kg) (07/30 0500)  Hemodynamic parameters for last 24 hours:   stable  Intake/Output from previous day: 07/29 0701 - 07/30 0700 In: 360 [P.O.:360] Out: -  Intake/Output this shift:    Scattered rhonchi 12+ pedal edema  Lab Results:  [facility] 03/24/12 0635  WBC 4.8  HGB 9.7*  HCT 29.6*  PLT 167   BMET:  Basename 03/25/12 0605 03/24/12 0635  NA 139 139  K 3.8 4.0  CL 109 109  CO2 23 21  GLUCOSE 87 88  BUN 20 19  CREATININE 1.29* 1.30*  CALCIUM 10.2 10.210.4    PT/INR: No results found for this basename: LABPROT,INR in the last 72 hours ABG    Component Value Date/Time   PHART 7.405 03/25/2012 0500   HCO3 21.8 03/25/2012 0500   TCO2 22.9 03/25/2012 0500   ACIDBASEDEF 2.2* 03/25/2012 0500   O2SAT 97.4 03/25/2012 0500   CBG (last 3)  No results found for this basename: GLUCAP:3 in the last 72 hours  Assessment/Plan: S/P Procedure(s) (LRB): CORONARY ARTERY BYPASS GRAFTING (CABG) (N/A) Plan- check vein mapping of lower extremity saphenous vein. If poor she may need radial artery harvest. Surgery in 2 days.  LOS: 4 days    Julia Santana,Julia Santana 03/26/2012

## 2012-03-26 NOTE — Progress Notes (Signed)
[id number] Pt states she has chest pressure/heaviness just with short walk to bathroom so I did not walk pt. Reviewed use of IS,splinting cough with heart pillow, importance of watching pre-op video, CRP 2 for after d/c. Told pt cardiac rehab would walk with her once transfer to 2000. Pt states she will be very receptive to walking as she wants to go home ASAP after surgery. 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 Discussed healing of breastbone and 5 lbs limit with lifting and importance of not over-doing. States husband will be available after surgery 24/7. Taia Bramlett DunlapRN

## 2012-03-27 ENCOUNTER — Encounter (HOSPITAL_COMMUNITY): Payer: Self-pay | Admitting: Anesthesiology

## 2012-03-27 DIAGNOSIS — I2510 Atherosclerotic heart disease of native coronary artery without angina pectoris: Secondary | ICD-10-CM

## 2012-03-27 DIAGNOSIS — Z01810 Encounter for preprocedural cardiovascular examination: Secondary | ICD-10-CM

## 2012-03-27 LAB — CBC
HCT: 29.6 % — ABNORMAL LOW (ref 36.0–46.0)
Hemoglobin: 9.8 g/dL — ABNORMAL LOW (ref 12.0–15.0)
MCH: 28.3 pg (ref 26.0–34.0)
MCHC: 33.1 g/dL (ref 30.0–36.0)
MCV: 85.5 fL (ref 78.0–100.0)
Platelets: 190 K/uL (ref 150–400)
RBC: 3.46 MIL/uL — ABNORMAL LOW (ref 3.87–5.11)
RDW: 14 % (ref 11.5–15.5)
WBC: 5 K/uL (ref 4.0–10.5)

## 2012-03-27 LAB — BASIC METABOLIC PANEL
BUN: 17 mg/dL (ref 6–23)
CO2: 25 mEq/L (ref 19–32)
CO2: 24 mEq/L (ref 19–32)
Calcium: 11.5 mg/dL — ABNORMAL HIGH (ref 8.4–10.5)
Chloride: 108 mEq/L (ref 96–112)
Chloride: 104 mEq/L (ref 96–112)
Creatinine, Ser: 1.4 mg/dL — ABNORMAL HIGH (ref 0.50–1.10)
GFR calc Af Amer: 43 mL/min — ABNORMAL LOW (ref >90)
GFR calc non Af Amer: 38 mL/min — ABNORMAL LOW (ref >90)
GFR calc non Af Amer: 37 mL/min — ABNORMAL LOW (ref >90)
Glucose, Bld: 91 mg/dL (ref 70–99)
Glucose, Bld: 94 mg/dL (ref 70–99)
Potassium: 3.6 mEq/L (ref 3.5–5.1)
Potassium: 3.9 mEq/L (ref 3.5–5.1)
Sodium: 140 mEq/L (ref 135–145)
Sodium: 138 mEq/L (ref 135–145)

## 2012-03-27 MED ORDER — DEXTROSE 5 % IV SOLN
1.5000 g | INTRAVENOUS | Status: AC
  Administered 2012-03-28: 1.5 g via INTRAVENOUS
  Administered 2012-03-28: .75 g via INTRAVENOUS
  Filled 2012-03-27 (×2): qty 1.5

## 2012-03-27 MED ORDER — TRANEXAMIC ACID 100 MG/ML IV SOLN
1.5000 mg/kg/h | INTRAVENOUS | Status: AC
  Administered 2012-03-28: 1.5 mg/kg/h via INTRAVENOUS
  Filled 2012-03-27: qty 25

## 2012-03-27 MED ORDER — METOPROLOL TARTRATE 12.5 MG HALF TABLET
12.5000 mg | ORAL_TABLET | Freq: Once | ORAL | Status: AC
  Administered 2012-03-28: 12.5 mg via ORAL
  Filled 2012-03-27: qty 1

## 2012-03-27 MED ORDER — DOPAMINE-DEXTROSE 3.2-5 MG/ML-% IV SOLN
2.0000 ug/kg/min | INTRAVENOUS | Status: DC
  Filled 2012-03-27: qty 250

## 2012-03-27 MED ORDER — TEMAZEPAM 15 MG PO CAPS: 15.0000 mg | ORAL_CAPSULE | Freq: Once | ORAL | Status: AC | PRN

## 2012-03-27 MED ORDER — PHENYLEPHRINE HCL 10 MG/ML IJ SOLN
30.0000 ug/min | INTRAVENOUS | Status: AC
  Administered 2012-03-28: 8 ug/min via INTRAVENOUS
  Filled 2012-03-27: qty 2

## 2012-03-27 MED ORDER — CHLORHEXIDINE GLUCONATE 4 % EX LIQD
60.0000 mL | Freq: Once | CUTANEOUS | Status: AC
  Administered 2012-03-27: 4 via TOPICAL
  Filled 2012-03-27: qty 60

## 2012-03-27 MED ORDER — NITROGLYCERIN IN D5W 200-5 MCG/ML-% IV SOLN
2.0000 ug/min | INTRAVENOUS | Status: AC
  Administered 2012-03-28: 5 ug/min via INTRAVENOUS
  Filled 2012-03-27: qty 250

## 2012-03-27 MED ORDER — MAGNESIUM SULFATE 50 % IJ SOLN
40.0000 meq | INTRAMUSCULAR | Status: DC
  Filled 2012-03-27: qty 10

## 2012-03-27 MED ORDER — VANCOMYCIN HCL 1000 MG IV SOLR
1500.0000 mg | INTRAVENOUS | Status: AC
  Administered 2012-03-28: 1500 mg via INTRAVENOUS
  Filled 2012-03-27: qty 1500

## 2012-03-27 MED ORDER — TRANEXAMIC ACID (OHS) PUMP PRIME SOLUTION
2.0000 mg/kg | INTRAVENOUS | Status: DC
  Filled 2012-03-27: qty 1.92

## 2012-03-27 MED ORDER — DEXMEDETOMIDINE HCL IN NACL 400 MCG/100ML IV SOLN
0.1000 ug/kg/h | INTRAVENOUS | Status: AC
  Administered 2012-03-28: .2 ug/kg/h via INTRAVENOUS
  Filled 2012-03-27: qty 100

## 2012-03-27 MED ORDER — EPINEPHRINE HCL 1 MG/ML IJ SOLN
0.5000 ug/min | INTRAVENOUS | Status: DC
  Filled 2012-03-27: qty 4

## 2012-03-27 MED ORDER — SODIUM CHLORIDE 0.9 % IV SOLN
INTRAVENOUS | Status: AC
  Administered 2012-03-28: .6 [IU]/h via INTRAVENOUS
  Filled 2012-03-27: qty 1

## 2012-03-27 MED ORDER — BISACODYL 5 MG PO TBEC
5.0000 mg | DELAYED_RELEASE_TABLET | Freq: Once | ORAL | Status: AC
  Administered 2012-03-27: 5 mg via ORAL
  Filled 2012-03-27 (×2): qty 1

## 2012-03-27 MED ORDER — SODIUM BICARBONATE 8.4 % IV SOLN
INTRAVENOUS | Status: AC
  Administered 2012-03-28: 10:00:00
  Filled 2012-03-27: qty 2.5

## 2012-03-27 MED ORDER — DEXTROSE 5 % IV SOLN
750.0000 mg | INTRAVENOUS | Status: DC
  Filled 2012-03-27: qty 750

## 2012-03-27 MED ORDER — BUMETANIDE 2 MG PO TABS
2.0000 mg | ORAL_TABLET | Freq: Once | ORAL | Status: AC
  Administered 2012-03-27: 2 mg via ORAL
  Filled 2012-03-27 (×2): qty 1

## 2012-03-27 MED ORDER — POTASSIUM CHLORIDE 2 MEQ/ML IV SOLN
80.0000 meq | INTRAVENOUS | Status: DC
  Filled 2012-03-27: qty 40

## 2012-03-27 MED ORDER — TRANEXAMIC ACID (OHS) BOLUS VIA INFUSION
15.0000 mg/kg | INTRAVENOUS | Status: AC
  Administered 2012-03-28: 1437 mg via INTRAVENOUS
  Filled 2012-03-27: qty 1437

## 2012-03-27 NOTE — Progress Notes (Signed)
Subjective:  Discussion with patient.  She is set for surgery tomorrow.    Objective:  Vital Signs in the last 24 hours: Temp:  [97.9 F (36.6 C)-98.6 F (37 C)] 97.9 F (36.6 C) (07/31 0500) Pulse Rate:  [63-69] 63  (07/31 0500) Resp:  [18] 18  (07/31 0500) BP: (135-160)/(67-80) 160/80 mmHg (07/31 0546) SpO2:  [98 %-99 %] 98 % (07/31 0500) Weight:  [211 lb 1.6 oz (95.754 kg)] 211 lb 1.6 oz (95.754 kg) (07/31 0500)  Intake/Output from previous day:     Physical Exam: General: Well developed, well nourished, in no acute distress. Head:  Normocephalic and atraumatic. Lungs: Modestly prolonged expiration.  Heart: Normal S1 and S2.  S4 Pulses: Pulses normal in all 4 extremities. Extremities: No clubbing or cyanosis. One plus  edema. Neurologic: Alert and oriented x 3.    Lab Results: No results found for this basename: WBC:2,HGB:2,PLT:2 in the last 72 hours  Basename 03/27/12 0527 03/25/12 0605  NA 140 139  K 3.6 3.8  CL 108 109  CO2 25 23  GLUCOSE 91 87  BUN 17 20  CREATININE 1.37* 1.29*   No results found for this basename: TROPONINI:2,CK,MB:2 in the last 72 hours Hepatic Function Panel No results found for this basename: PROT,ALBUMIN,AST,ALT,ALKPHOS,BILITOT,BILIDIR,IBILI in the last 72 hours No results found for this basename: CHOL in the last 72 hours No results found for this basename: PROTIME in the last 72 hours  Imaging: No results found.   Assessment/Plan:  Patient Active Hospital Problem List: Unstable angina (03/23/2012)   Assessment: no further pain   Plan: CABG tomorrow  HYPERTENSION (02/25/2009)   Assessment: slightly improved   Plan: continue meds Renal insufficiency (03/20/2012)   Assessment: stable   Plan: monitor Edema   Will give extra dose of diuretic today.       Bing Quarry, MD, [facility], [place] 03/27/2012, 1:53 PM

## 2012-03-27 NOTE — Progress Notes (Signed)
Procedure(s) (LRB): CORONARY ARTERY BYPASS GRAFTING (CABG) (N/A) Subjective: Severe three-vessel coronary disease with unstable angina Status post parathyroidectomy April 2013 at outside hospital with persistent elevation of PTH level and calcium sinus rhythm  Objective: Vital signs in last 24 hours: Temp:  [97.9 F (36.6 C)-98.6 F (37 C)] 98.2 F (36.8 C) (07/31 1400) Pulse Rate:  [63-69] 64  (07/31 1400) Cardiac Rhythm:  [-] Normal sinus rhythm;Heart block (07/31 0827) Resp:  [18] 18  (07/31 1400) BP: (135-160)/(67-82) 157/82 mmHg (07/31 1400) SpO2:  [98 %-99 %] 98 % (07/31 1400) Weight:  [211 lb 1.6 oz (95.754 kg)] 211 lb 1.6 oz (95.754 kg) (07/31 0500)  Hemodynamic parameters for last 24 hours:    Intake/Output from previous day:   Intake/Output this shift: Total I/O In: 720 [P.O.:720] Out: -   Lungs clear minimal edema  Lab Results:  [facility] 03/27/12 1456  WBC 5.0  HGB 9.8*  HCT 29.6*  PLT 190   BMET:  Basename 03/27/12 1456 03/27/12 0527  NA 138 140  K 3.9 3.6  CL 104 108  CO2 24 25  GLUCOSE 94 91  BUN 17 17  CREATININE 1.40* 1.37*  CALCIUM 11.5* 10.8*    PT/INR: No results found for this basename: LABPROT,INR in the last 72 hours ABG    Component Value Date/Time   PHART 7.405 03/25/2012 0500   HCO3 21.8 03/25/2012 0500   TCO2 22.9 03/25/2012 0500   ACIDBASEDEF 2.2* 03/25/2012 0500   O2SAT 97.4 03/25/2012 0500   CBG (last 3)  No results found for this basename: GLUCAP:3 in the last 72 hours  Assessment/Plan: S/P Procedure(s) (LRB): CORONARY ARTERY BYPASS GRAFTING (CABG) (N/A) Multivessel bypass grafting in a.m. Patient had increased risk due to underlying renal insufficiency and hypercalcemia and obesity and. COPD   LOS: 5 days    VAN TRIGT III,Laurna Shetley 03/27/2012

## 2012-03-28 ENCOUNTER — Inpatient Hospital Stay (HOSPITAL_COMMUNITY): Payer: Medicare Other

## 2012-03-28 ENCOUNTER — Encounter (HOSPITAL_COMMUNITY): Payer: Self-pay | Admitting: Anesthesiology

## 2012-03-28 ENCOUNTER — Encounter (HOSPITAL_COMMUNITY)
Admission: AD | Disposition: A | Discharge status: Home / Self Care | Payer: Self-pay | Source: Ambulatory Visit | Attending: Cardiothoracic Surgery

## 2012-03-28 ENCOUNTER — Inpatient Hospital Stay (HOSPITAL_COMMUNITY): Payer: Medicare Other | Admitting: Anesthesiology

## 2012-03-28 DIAGNOSIS — I2510 Atherosclerotic heart disease of native coronary artery without angina pectoris: Secondary | ICD-10-CM

## 2012-03-28 HISTORY — PX: CORONARY ARTERY BYPASS GRAFT: SHX141

## 2012-03-28 LAB — GLUCOSE, CAPILLARY: Glucose-Capillary: 108 mg/dL — ABNORMAL HIGH (ref 70–99)

## 2012-03-28 LAB — POCT I-STAT 4, (NA,K, GLUC, HGB,HCT)
Glucose, Bld: 79 mg/dL (ref 70–99)
Glucose, Bld: 102 mg/dL — ABNORMAL HIGH (ref 70–99)
Glucose, Bld: 91 mg/dL (ref 70–99)
Glucose, Bld: 108 mg/dL — ABNORMAL HIGH (ref 70–99)
Glucose, Bld: 103 mg/dL — ABNORMAL HIGH (ref 70–99)
HCT: 28 % — ABNORMAL LOW (ref 36.0–46.0)
HCT: 23 % — ABNORMAL LOW (ref 36.0–46.0)
HCT: 26 % — ABNORMAL LOW (ref 36.0–46.0)
HCT: 26 % — ABNORMAL LOW (ref 36.0–46.0)
Hemoglobin: 9.5 g/dL — ABNORMAL LOW (ref 12.0–15.0)
Hemoglobin: 7.8 g/dL — ABNORMAL LOW (ref 12.0–15.0)
Hemoglobin: 8.8 g/dL — ABNORMAL LOW (ref 12.0–15.0)
Hemoglobin: 8.8 g/dL — ABNORMAL LOW (ref 12.0–15.0)
Potassium: 3.5 mEq/L (ref 3.5–5.1)
Potassium: 3.6 mEq/L (ref 3.5–5.1)
Potassium: 4.1 mEq/L (ref 3.5–5.1)
Potassium: 5 mEq/L (ref 3.5–5.1)
Sodium: 140 mEq/L (ref 135–145)
Sodium: 135 mEq/L (ref 135–145)

## 2012-03-28 LAB — POCT I-STAT 3, ART BLOOD GAS (G3+)
Acid-Base Excess: 1 mmol/L (ref 0.0–2.0)
Acid-base deficit: 2 mmol/L (ref 0.0–2.0)
Bicarbonate: 24.4 mEq/L — ABNORMAL HIGH (ref 20.0–24.0)
Bicarbonate: 25.9 mEq/L — ABNORMAL HIGH (ref 20.0–24.0)
Patient temperature: 37.1
TCO2: 27 mmol/L (ref 0–100)
pCO2 arterial: 29.3 mmHg — ABNORMAL LOW (ref 35.0–45.0)
pCO2 arterial: 27.5 mmHg — ABNORMAL LOW (ref 35.0–45.0)
pH, Arterial: 7.555 — ABNORMAL HIGH (ref 7.350–7.450)
pH, Arterial: 7.544 — ABNORMAL HIGH (ref 7.350–7.450)
pO2, Arterial: 442 mmHg — ABNORMAL HIGH (ref 80.0–100.0)
pO2, Arterial: 187 mmHg — ABNORMAL HIGH (ref 80.0–100.0)

## 2012-03-28 LAB — CBC
HCT: 26.1 % — ABNORMAL LOW (ref 36.0–46.0)
HCT: 29.3 % — ABNORMAL LOW (ref 36.0–46.0)
Hemoglobin: 9 g/dL — ABNORMAL LOW (ref 12.0–15.0)
Hemoglobin: 10.1 g/dL — ABNORMAL LOW (ref 12.0–15.0)
MCH: 28.6 pg (ref 26.0–34.0)
MCH: 28.5 pg (ref 26.0–34.0)
MCHC: 34.5 g/dL (ref 30.0–36.0)
MCHC: 34.5 g/dL (ref 30.0–36.0)
MCV: 82.9 fL (ref 78.0–100.0)
MCV: 82.5 fL (ref 78.0–100.0)
Platelets: 131 10*3/uL — ABNORMAL LOW (ref 150–400)
RBC: 3.55 MIL/uL — ABNORMAL LOW (ref 3.87–5.11)
RDW: 13.6 % (ref 11.5–15.5)
RDW: 13.8 % (ref 11.5–15.5)
WBC: 9.2 10*3/uL (ref 4.0–10.5)

## 2012-03-28 LAB — POCT I-STAT, CHEM 8
Glucose, Bld: 147 mg/dL — ABNORMAL HIGH (ref 70–99)
HCT: 27 % — ABNORMAL LOW (ref 36.0–46.0)
Hemoglobin: 9.2 g/dL — ABNORMAL LOW (ref 12.0–15.0)
Potassium: 4.2 mEq/L (ref 3.5–5.1)
TCO2: 19 mmol/L (ref 0–100)

## 2012-03-28 LAB — PROTIME-INR
INR: 1.09 (ref 0.00–1.49)
INR: 1.46 (ref 0.00–1.49)
Prothrombin Time: 14.3 seconds (ref 11.6–15.2)

## 2012-03-28 LAB — MAGNESIUM: Magnesium: 2.9 mg/dL — ABNORMAL HIGH (ref 1.5–2.5)

## 2012-03-28 LAB — CREATININE, SERUM
Creatinine, Ser: 1.34 mg/dL — ABNORMAL HIGH (ref 0.50–1.10)
GFR calc Af Amer: 45 mL/min — ABNORMAL LOW (ref >90)
GFR calc non Af Amer: 39 mL/min — ABNORMAL LOW (ref >90)

## 2012-03-28 LAB — HEMOGLOBIN AND HEMATOCRIT, BLOOD
HCT: 25.1 % — ABNORMAL LOW (ref 36.0–46.0)
Hemoglobin: 8.6 g/dL — ABNORMAL LOW (ref 12.0–15.0)

## 2012-03-28 LAB — APTT: aPTT: 45 seconds — ABNORMAL HIGH (ref 24–37)

## 2012-03-28 LAB — PLATELET COUNT: Platelets: 123 K/uL — ABNORMAL LOW (ref 150–400)

## 2012-03-28 SURGERY — CORONARY ARTERY BYPASS GRAFTING (CABG)
Anesthesia: General | Site: Chest | Wound class: Clean

## 2012-03-28 MED ORDER — MORPHINE SULFATE 2 MG/ML IJ SOLN
1.0000 mg | INTRAMUSCULAR | Status: AC | PRN
  Filled 2012-03-28: qty 1

## 2012-03-28 MED ORDER — METOPROLOL TARTRATE 12.5 MG HALF TABLET
12.5000 mg | ORAL_TABLET | Freq: Two times a day (BID) | ORAL | Status: DC
  Administered 2012-03-29 – 2012-03-31 (×5): 12.5 mg via ORAL
  Filled 2012-03-28 (×9): qty 1

## 2012-03-28 MED ORDER — LACTATED RINGERS IV SOLN
INTRAVENOUS | Status: DC
  Administered 2012-03-28: 20 mL/h via INTRAVENOUS
  Administered 2012-03-30: 05:00:00 via INTRAVENOUS

## 2012-03-28 MED ORDER — PHENYLEPHRINE HCL 10 MG/ML IJ SOLN
0.0000 ug/min | INTRAVENOUS | Status: DC
  Filled 2012-03-28: qty 2

## 2012-03-28 MED ORDER — SODIUM CHLORIDE 0.9 % IJ SOLN
OROMUCOSAL | Status: DC | PRN
  Administered 2012-03-28: 10:00:00 via TOPICAL

## 2012-03-28 MED ORDER — MILRINONE IN DEXTROSE 200-5 MCG/ML-% IV SOLN
0.3000 ug/kg/min | INTRAVENOUS | Status: DC
  Administered 2012-03-28: 0.3 ug/kg/min via INTRAVENOUS
  Filled 2012-03-28: qty 100

## 2012-03-28 MED ORDER — MORPHINE SULFATE 2 MG/ML IJ SOLN
2.0000 mg | INTRAMUSCULAR | Status: DC | PRN
  Administered 2012-03-28 – 2012-03-29 (×5): 2 mg via INTRAVENOUS
  Administered 2012-03-29: 4 mg via INTRAVENOUS
  Administered 2012-03-29 (×3): 2 mg via INTRAVENOUS
  Filled 2012-03-28 (×6): qty 1
  Filled 2012-03-28: qty 2
  Filled 2012-03-28: qty 1

## 2012-03-28 MED ORDER — NITROPRUSSIDE SODIUM 25 MG/ML IV SOLN
0.2500 ug/kg/min | INTRAVENOUS | Status: DC
  Administered 2012-03-28: 0.1 ug/kg/min via INTRAVENOUS
  Filled 2012-03-28: qty 2

## 2012-03-28 MED ORDER — ROCURONIUM BROMIDE 100 MG/10ML IV SOLN
INTRAVENOUS | Status: DC | PRN
  Administered 2012-03-28: 50 mg via INTRAVENOUS

## 2012-03-28 MED ORDER — ALBUMIN HUMAN 5 % IV SOLN
INTRAVENOUS | Status: DC | PRN
  Administered 2012-03-28 (×2): via INTRAVENOUS

## 2012-03-28 MED ORDER — PROPOFOL 10 MG/ML IV EMUL
INTRAVENOUS | Status: DC | PRN
  Administered 2012-03-28: 70 mg via INTRAVENOUS

## 2012-03-28 MED ORDER — TIOTROPIUM BROMIDE MONOHYDRATE 18 MCG IN CAPS
18.0000 ug | ORAL_CAPSULE | Freq: Every day | RESPIRATORY_TRACT | Status: DC
  Administered 2012-03-30 – 2012-04-04 (×6): 18 ug via RESPIRATORY_TRACT
  Filled 2012-03-28 (×2): qty 5

## 2012-03-28 MED ORDER — LEVALBUTEROL HCL 1.25 MG/0.5ML IN NEBU
1.2500 mg | INHALATION_SOLUTION | Freq: Four times a day (QID) | RESPIRATORY_TRACT | Status: DC
  Administered 2012-03-28 – 2012-03-30 (×9): 1.25 mg via RESPIRATORY_TRACT
  Filled 2012-03-28 (×11): qty 0.5

## 2012-03-28 MED ORDER — ASPIRIN 81 MG PO CHEW
324.0000 mg | CHEWABLE_TABLET | Freq: Every day | ORAL | Status: DC
  Filled 2012-03-28: qty 4

## 2012-03-28 MED ORDER — MILRINONE IN DEXTROSE 200-5 MCG/ML-% IV SOLN
0.3000 ug/kg/min | INTRAVENOUS | Status: DC
  Administered 2012-03-28 – 2012-03-29 (×2): 0.3 ug/kg/min via INTRAVENOUS
  Filled 2012-03-28: qty 100

## 2012-03-28 MED ORDER — ACETAMINOPHEN 160 MG/5ML PO SOLN
975.0000 mg | Freq: Four times a day (QID) | ORAL | Status: DC
  Administered 2012-03-28: 975 mg
  Filled 2012-03-28: qty 40.6

## 2012-03-28 MED ORDER — 0.9 % SODIUM CHLORIDE (POUR BTL) OPTIME
TOPICAL | Status: DC | PRN
  Administered 2012-03-28: 6000 mL

## 2012-03-28 MED ORDER — INSULIN ASPART 100 UNIT/ML ~~LOC~~ SOLN
0.0000 [IU] | SUBCUTANEOUS | Status: DC
  Administered 2012-03-29 (×2): 2 [IU] via SUBCUTANEOUS
  Administered 2012-03-29 (×2): 4 [IU] via SUBCUTANEOUS
  Administered 2012-03-29: 8 [IU] via SUBCUTANEOUS
  Administered 2012-03-30 (×2): 2 [IU] via SUBCUTANEOUS
  Administered 2012-03-30: 4 [IU] via SUBCUTANEOUS

## 2012-03-28 MED ORDER — OXYCODONE HCL 5 MG PO TABS
5.0000 mg | ORAL_TABLET | ORAL | Status: DC | PRN
  Administered 2012-03-29 (×3): 5 mg via ORAL
  Administered 2012-03-29: 10 mg via ORAL
  Administered 2012-03-29: 5 mg via ORAL
  Administered 2012-03-30 (×2): 10 mg via ORAL
  Administered 2012-03-31: 5 mg via ORAL
  Filled 2012-03-28: qty 2
  Filled 2012-03-28 (×3): qty 1
  Filled 2012-03-28: qty 2
  Filled 2012-03-28: qty 1
  Filled 2012-03-28: qty 2
  Filled 2012-03-28: qty 1

## 2012-03-28 MED ORDER — SODIUM CHLORIDE 0.9 % IV SOLN: 250.0000 mL | INTRAVENOUS | Status: DC

## 2012-03-28 MED ORDER — VECURONIUM BROMIDE 10 MG IV SOLR
INTRAVENOUS | Status: DC | PRN
  Administered 2012-03-28 (×3): 10 mg via INTRAVENOUS

## 2012-03-28 MED ORDER — NITROGLYCERIN IN D5W 200-5 MCG/ML-% IV SOLN: 0.0000 ug/min | INTRAVENOUS | Status: DC

## 2012-03-28 MED ORDER — ALBUMIN HUMAN 5 % IV SOLN
250.0000 mL | INTRAVENOUS | Status: AC | PRN
  Administered 2012-03-28 (×3): 250 mL via INTRAVENOUS
  Filled 2012-03-28: qty 250

## 2012-03-28 MED ORDER — SODIUM CHLORIDE 0.9 % IV SOLN
INTRAVENOUS | Status: DC
  Administered 2012-03-28: 0.4 [IU]/h via INTRAVENOUS
  Filled 2012-03-28: qty 1

## 2012-03-28 MED ORDER — BISACODYL 5 MG PO TBEC
10.0000 mg | DELAYED_RELEASE_TABLET | Freq: Every day | ORAL | Status: DC
  Administered 2012-03-29 – 2012-04-04 (×6): 10 mg via ORAL
  Filled 2012-03-28 (×7): qty 2

## 2012-03-28 MED ORDER — PROTAMINE SULFATE 10 MG/ML IV SOLN
INTRAVENOUS | Status: DC | PRN
  Administered 2012-03-28: 350 mg via INTRAVENOUS

## 2012-03-28 MED ORDER — SUFENTANIL CITRATE 50 MCG/ML IV SOLN
INTRAVENOUS | Status: DC | PRN
  Administered 2012-03-28: 30 ug via INTRAVENOUS
  Administered 2012-03-28: 20 ug via INTRAVENOUS
  Administered 2012-03-28 (×2): 10 ug via INTRAVENOUS
  Administered 2012-03-28: 40 ug via INTRAVENOUS
  Administered 2012-03-28: 20 ug via INTRAVENOUS
  Administered 2012-03-28: 30 ug via INTRAVENOUS
  Administered 2012-03-28: 40 ug via INTRAVENOUS
  Administered 2012-03-28: 20 ug via INTRAVENOUS
  Administered 2012-03-28 (×2): 30 ug via INTRAVENOUS

## 2012-03-28 MED ORDER — SODIUM CHLORIDE 0.9 % IJ SOLN
3.0000 mL | INTRAMUSCULAR | Status: DC | PRN
  Administered 2012-03-29: 10 mL via INTRAVENOUS

## 2012-03-28 MED ORDER — SODIUM CHLORIDE 0.45 % IV SOLN
INTRAVENOUS | Status: DC
  Administered 2012-03-28: 20 mL/h via INTRAVENOUS

## 2012-03-28 MED ORDER — ASPIRIN EC 325 MG PO TBEC
325.0000 mg | DELAYED_RELEASE_TABLET | Freq: Every day | ORAL | Status: DC
  Administered 2012-03-29 – 2012-04-04 (×7): 325 mg via ORAL
  Filled 2012-03-28 (×7): qty 1

## 2012-03-28 MED ORDER — FAMOTIDINE IN NACL 20-0.9 MG/50ML-% IV SOLN
20.0000 mg | Freq: Two times a day (BID) | INTRAVENOUS | Status: AC
  Administered 2012-03-28 – 2012-03-29 (×2): 20 mg via INTRAVENOUS
  Filled 2012-03-28: qty 50

## 2012-03-28 MED ORDER — LACTATED RINGERS IV SOLN: 500.0000 mL | Freq: Once | INTRAVENOUS | Status: AC | PRN

## 2012-03-28 MED ORDER — VANCOMYCIN HCL IN DEXTROSE 1-5 GM/200ML-% IV SOLN
1000.0000 mg | Freq: Once | INTRAVENOUS | Status: DC
  Filled 2012-03-28: qty 200

## 2012-03-28 MED ORDER — METOPROLOL TARTRATE 1 MG/ML IV SOLN
2.5000 mg | INTRAVENOUS | Status: DC | PRN
  Administered 2012-03-28 – 2012-03-29 (×3): 5 mg via INTRAVENOUS
  Administered 2012-04-03 (×2): 2.5 mg via INTRAVENOUS
  Filled 2012-03-28 (×2): qty 5

## 2012-03-28 MED ORDER — ONDANSETRON HCL 4 MG/2ML IJ SOLN: 4.0000 mg | Freq: Four times a day (QID) | INTRAMUSCULAR | Status: DC | PRN

## 2012-03-28 MED ORDER — DEXMEDETOMIDINE HCL IN NACL 200 MCG/50ML IV SOLN
0.1000 ug/kg/h | INTRAVENOUS | Status: DC
  Administered 2012-03-28: 0.5 ug/kg/h via INTRAVENOUS
  Filled 2012-03-28 (×3): qty 50

## 2012-03-28 MED ORDER — PHENYLEPHRINE HCL 10 MG/ML IJ SOLN
10.0000 mg | INTRAVENOUS | Status: DC | PRN
  Administered 2012-03-28: 5 ug/min via INTRAVENOUS

## 2012-03-28 MED ORDER — HEMOSTATIC AGENTS (NO CHARGE) OPTIME
TOPICAL | Status: DC | PRN
  Administered 2012-03-28: 1 via TOPICAL

## 2012-03-28 MED ORDER — DOBUTAMINE IN D5W 4-5 MG/ML-% IV SOLN
INTRAVENOUS | Status: DC | PRN
  Administered 2012-03-28: 2 ug/kg/min via INTRAVENOUS

## 2012-03-28 MED ORDER — ACETAMINOPHEN 650 MG RE SUPP
650.0000 mg | RECTAL | Status: AC
  Administered 2012-03-28: 650 mg via RECTAL

## 2012-03-28 MED ORDER — POTASSIUM CHLORIDE 10 MEQ/50ML IV SOLN
10.0000 meq | INTRAVENOUS | Status: AC
  Administered 2012-03-28 (×3): 10 meq via INTRAVENOUS

## 2012-03-28 MED ORDER — INSULIN REGULAR BOLUS VIA INFUSION
0.0000 [IU] | Freq: Three times a day (TID) | INTRAVENOUS | Status: DC
  Administered 2012-03-28: 0 [IU] via INTRAVENOUS
  Filled 2012-03-28: qty 10

## 2012-03-28 MED ORDER — MIDAZOLAM HCL 2 MG/2ML IJ SOLN: 2.0000 mg | INTRAMUSCULAR | Status: DC | PRN

## 2012-03-28 MED ORDER — ACETAMINOPHEN 160 MG/5ML PO SOLN: 650.0000 mg | ORAL | Status: AC

## 2012-03-28 MED ORDER — BISACODYL 10 MG RE SUPP: 10.0000 mg | Freq: Every day | RECTAL | Status: DC

## 2012-03-28 MED ORDER — LABETALOL HCL 5 MG/ML IV SOLN
INTRAVENOUS | Status: AC
  Administered 2012-03-28: 10 mg via INTRAVENOUS
  Filled 2012-03-28: qty 4

## 2012-03-28 MED ORDER — LIDOCAINE HCL (CARDIAC) 20 MG/ML IV SOLN
INTRAVENOUS | Status: DC | PRN
  Administered 2012-03-28: 100 mg via INTRAVENOUS

## 2012-03-28 MED ORDER — LACTATED RINGERS IV SOLN
INTRAVENOUS | Status: DC | PRN
  Administered 2012-03-28 (×4): via INTRAVENOUS

## 2012-03-28 MED ORDER — MAGNESIUM SULFATE 40 MG/ML IJ SOLN
4.0000 g | Freq: Once | INTRAMUSCULAR | Status: AC
  Administered 2012-03-28: 4 g via INTRAVENOUS
  Filled 2012-03-28: qty 100

## 2012-03-28 MED ORDER — MIDAZOLAM HCL 5 MG/5ML IJ SOLN
INTRAMUSCULAR | Status: DC | PRN
  Administered 2012-03-28: 2 mg via INTRAVENOUS
  Administered 2012-03-28: 1 mg via INTRAVENOUS
  Administered 2012-03-28: 5 mg via INTRAVENOUS
  Administered 2012-03-28: 2 mg via INTRAVENOUS

## 2012-03-28 MED ORDER — DEXTROSE 5 % IV SOLN
1.5000 g | Freq: Two times a day (BID) | INTRAVENOUS | Status: AC
  Administered 2012-03-29 – 2012-03-30 (×4): 1.5 g via INTRAVENOUS
  Filled 2012-03-28 (×4): qty 1.5

## 2012-03-28 MED ORDER — LABETALOL HCL 5 MG/ML IV SOLN
10.0000 mg | Freq: Once | INTRAVENOUS | Status: AC
  Administered 2012-03-28: 10 mg via INTRAVENOUS

## 2012-03-28 MED ORDER — ACETAMINOPHEN 500 MG PO TABS
1000.0000 mg | ORAL_TABLET | Freq: Four times a day (QID) | ORAL | Status: AC
Start: 2012-03-28 — End: 2012-04-02
  Administered 2012-03-29 – 2012-04-02 (×14): 1000 mg via ORAL
  Filled 2012-03-28 (×17): qty 2

## 2012-03-28 MED ORDER — INSULIN ASPART 100 UNIT/ML ~~LOC~~ SOLN
0.0000 [IU] | SUBCUTANEOUS | Status: AC
  Administered 2012-03-28 – 2012-03-29 (×3): 2 [IU] via SUBCUTANEOUS

## 2012-03-28 MED ORDER — METOPROLOL TARTRATE 25 MG/10 ML ORAL SUSPENSION
12.5000 mg | Freq: Two times a day (BID) | ORAL | Status: DC
  Administered 2012-03-28: 12.5 mg
  Filled 2012-03-28 (×9): qty 5

## 2012-03-28 MED ORDER — DOBUTAMINE IN D5W 4-5 MG/ML-% IV SOLN
2.5000 ug/kg/min | INTRAVENOUS | Status: DC
  Filled 2012-03-28 (×2): qty 250

## 2012-03-28 MED ORDER — HEPARIN SODIUM (PORCINE) 1000 UNIT/ML IJ SOLN
INTRAMUSCULAR | Status: DC | PRN
  Administered 2012-03-28: 31000 [IU] via INTRAVENOUS
  Administered 2012-03-28: 5000 [IU] via INTRAVENOUS

## 2012-03-28 MED ORDER — PANTOPRAZOLE SODIUM 40 MG PO TBEC
40.0000 mg | DELAYED_RELEASE_TABLET | Freq: Every day | ORAL | Status: DC
  Filled 2012-03-28: qty 1

## 2012-03-28 MED ORDER — SODIUM CHLORIDE 0.9 % IJ SOLN
3.0000 mL | Freq: Two times a day (BID) | INTRAMUSCULAR | Status: DC
  Administered 2012-03-29 – 2012-04-03 (×10): 3 mL via INTRAVENOUS

## 2012-03-28 MED ORDER — VANCOMYCIN HCL IN DEXTROSE 1-5 GM/200ML-% IV SOLN
1000.0000 mg | Freq: Two times a day (BID) | INTRAVENOUS | Status: AC
  Administered 2012-03-28 – 2012-03-29 (×3): 1000 mg via INTRAVENOUS
  Filled 2012-03-28 (×3): qty 200

## 2012-03-28 MED ORDER — SODIUM CHLORIDE 0.9 % IV SOLN
INTRAVENOUS | Status: DC
  Administered 2012-03-28: 20 mL/h via INTRAVENOUS

## 2012-03-28 MED ORDER — DOCUSATE SODIUM 100 MG PO CAPS
200.0000 mg | ORAL_CAPSULE | Freq: Every day | ORAL | Status: DC
  Administered 2012-03-29 – 2012-04-04 (×6): 200 mg via ORAL
  Filled 2012-03-28 (×4): qty 2
  Filled 2012-03-28: qty 1
  Filled 2012-03-28 (×3): qty 2

## 2012-03-28 SURGICAL SUPPLY — 107 items
ADAPTER CARDIO PERF ANTE/RETRO (ADAPTER) ×3 IMPLANT
ATTRACTOMAT 16X20 MAGNETIC DRP (DRAPES) ×3 IMPLANT
BAG DECANTER FOR FLEXI CONT (MISCELLANEOUS) ×3 IMPLANT
BANDAGE ELASTIC 4 VELCRO ST LF (GAUZE/BANDAGES/DRESSINGS) ×3 IMPLANT
BANDAGE ELASTIC 6 VELCRO ST LF (GAUZE/BANDAGES/DRESSINGS) ×3 IMPLANT
BANDAGE GAUZE ELAST BULKY 4 IN (GAUZE/BANDAGES/DRESSINGS) ×3 IMPLANT
BASKET HEART  (ORDER IN 25'S) (MISCELLANEOUS) ×1
BASKET HEART (ORDER IN 25'S) (MISCELLANEOUS) ×1
BASKET HEART (ORDER IN 25S) (MISCELLANEOUS) ×1 IMPLANT
BENZOIN TINCTURE PRP APPL 2/3 (GAUZE/BANDAGES/DRESSINGS) ×3 IMPLANT
BLADE STERNUM SYSTEM 6 (BLADE) ×3 IMPLANT
BLADE SURG 12 STRL SS (BLADE) ×3 IMPLANT
BLADE SURG ROTATE 9660 (MISCELLANEOUS) IMPLANT
CANISTER SUCTION 2500CC (MISCELLANEOUS) ×3 IMPLANT
CANNULA AORTIC HI-FLOW 6.5M20F (CANNULA) ×3 IMPLANT
CANNULA ARTERIAL NVNT 3/8 20FR (MISCELLANEOUS) ×3 IMPLANT
CANNULA GUNDRY RCSP 15FR (MISCELLANEOUS) ×3 IMPLANT
CANNULA VENOUS MAL SGL STG 40 (MISCELLANEOUS) IMPLANT
CANNULAE VENOUS MAL SGL STG 40 (MISCELLANEOUS)
CATH CPB KIT VANTRIGT (MISCELLANEOUS) ×3 IMPLANT
CATH ROBINSON RED A/P 18FR (CATHETERS) ×9 IMPLANT
CATH THORACIC 28FR (CATHETERS) IMPLANT
CATH THORACIC 28FR RT ANG (CATHETERS) IMPLANT
CATH THORACIC 36FR (CATHETERS) IMPLANT
CATH THORACIC 36FR RT ANG (CATHETERS) ×3 IMPLANT
CLIP TI WIDE RED SMALL 24 (CLIP) ×3 IMPLANT
CLOSURE WOUND 1/2 X4 (GAUZE/BANDAGES/DRESSINGS) ×1
CLOTH BEACON ORANGE TIMEOUT ST (SAFETY) ×3 IMPLANT
COVER SURGICAL LIGHT HANDLE (MISCELLANEOUS) ×3 IMPLANT
CRADLE DONUT ADULT HEAD (MISCELLANEOUS) ×3 IMPLANT
DRAIN CHANNEL 32F RND 10.7 FF (WOUND CARE) ×3 IMPLANT
DRAPE CARDIOVASCULAR INCISE (DRAPES) ×2
DRAPE SLUSH MACHINE 52X66 (DRAPES) IMPLANT
DRAPE SLUSH/WARMER DISC (DRAPES) ×3 IMPLANT
DRAPE SRG 135X102X78XABS (DRAPES) ×1 IMPLANT
DRSG COVADERM 4X14 (GAUZE/BANDAGES/DRESSINGS) ×3 IMPLANT
ELECT BLADE 4.0 EZ CLEAN MEGAD (MISCELLANEOUS) ×3
ELECT BLADE 6.5 EXT (BLADE) ×3 IMPLANT
ELECT CAUTERY BLADE 6.4 (BLADE) ×3 IMPLANT
ELECT REM PT RETURN 9FT ADLT (ELECTROSURGICAL) ×6
ELECTRODE BLDE 4.0 EZ CLN MEGD (MISCELLANEOUS) ×1 IMPLANT
ELECTRODE REM PT RTRN 9FT ADLT (ELECTROSURGICAL) ×2 IMPLANT
GLOVE BIO SURGEON STRL SZ 6 (GLOVE) ×15 IMPLANT
GLOVE BIO SURGEON STRL SZ 6.5 (GLOVE) ×6 IMPLANT
GLOVE BIO SURGEON STRL SZ7.5 (GLOVE) ×6 IMPLANT
GLOVE BIO SURGEONS STRL SZ 6.5 (GLOVE) ×3
GOWN EXTRA PROTECTION XL (GOWNS) ×3 IMPLANT
GOWN STRL NON-REIN LRG LVL3 (GOWN DISPOSABLE) ×24 IMPLANT
HEMOSTAT POWDER SURGIFOAM 1G (HEMOSTASIS) ×9 IMPLANT
HEMOSTAT SURGICEL 2X14 (HEMOSTASIS) ×3 IMPLANT
INSERT FOGARTY XLG (MISCELLANEOUS) IMPLANT
KIT BASIN OR (CUSTOM PROCEDURE TRAY) ×3 IMPLANT
KIT ROOM TURNOVER OR (KITS) ×3 IMPLANT
KIT SUCTION CATH 14FR (SUCTIONS) ×3 IMPLANT
KIT VASOVIEW W/TROCAR VH 2000 (KITS) ×3 IMPLANT
LEAD PACING MYOCARDI (MISCELLANEOUS) ×3 IMPLANT
MARKER GRAFT CORONARY BYPASS (MISCELLANEOUS) ×9 IMPLANT
NS IRRIG 1000ML POUR BTL (IV SOLUTION) ×18 IMPLANT
PACK OPEN HEART (CUSTOM PROCEDURE TRAY) ×3 IMPLANT
PAD ARMBOARD 7.5X6 YLW CONV (MISCELLANEOUS) ×6 IMPLANT
PENCIL BUTTON HOLSTER BLD 10FT (ELECTRODE) ×3 IMPLANT
PUNCH AORTIC ROTATE 4.0MM (MISCELLANEOUS) ×3 IMPLANT
PUNCH AORTIC ROTATE 4.5MM 8IN (MISCELLANEOUS) IMPLANT
PUNCH AORTIC ROTATE 5MM 8IN (MISCELLANEOUS) IMPLANT
SET CARDIOPLEGIA MPS 5001102 (MISCELLANEOUS) ×3 IMPLANT
SPONGE GAUZE 4X4 12PLY (GAUZE/BANDAGES/DRESSINGS) ×12 IMPLANT
STRIP CLOSURE SKIN 1/2X4 (GAUZE/BANDAGES/DRESSINGS) ×2 IMPLANT
SUT BONE WAX W31G (SUTURE) ×3 IMPLANT
SUT MNCRL AB 4-0 PS2 18 (SUTURE) ×3 IMPLANT
SUT PROLENE 3 0 SH DA (SUTURE) IMPLANT
SUT PROLENE 3 0 SH1 36 (SUTURE) IMPLANT
SUT PROLENE 4 0 RB 1 (SUTURE) ×6
SUT PROLENE 4 0 SH DA (SUTURE) ×3 IMPLANT
SUT PROLENE 4-0 RB1 .5 CRCL 36 (SUTURE) ×3 IMPLANT
SUT PROLENE 5 0 C 1 36 (SUTURE) ×3 IMPLANT
SUT PROLENE 6 0 C 1 30 (SUTURE) IMPLANT
SUT PROLENE 7 0 DA (SUTURE) IMPLANT
SUT PROLENE 7.0 RB 3 (SUTURE) ×9 IMPLANT
SUT PROLENE 8 0 BV175 6 (SUTURE) IMPLANT
SUT PROLENE BLUE 7 0 (SUTURE) ×3 IMPLANT
SUT PROLENE POLY MONO (SUTURE) ×3 IMPLANT
SUT SILK  1 MH (SUTURE)
SUT SILK 1 MH (SUTURE) IMPLANT
SUT SILK 2 0 SH CR/8 (SUTURE) IMPLANT
SUT SILK 3 0 SH CR/8 (SUTURE) IMPLANT
SUT STEEL 6MS V (SUTURE) ×9 IMPLANT
SUT STEEL STERNAL CCS#1 18IN (SUTURE) IMPLANT
SUT STEEL SZ 6 DBL 3X14 BALL (SUTURE) ×12 IMPLANT
SUT VIC AB 1 CTX 36 (SUTURE) ×10
SUT VIC AB 1 CTX36XBRD ANBCTR (SUTURE) ×5 IMPLANT
SUT VIC AB 2-0 CT1 27 (SUTURE) ×2
SUT VIC AB 2-0 CT1 TAPERPNT 27 (SUTURE) ×1 IMPLANT
SUT VIC AB 2-0 CTX 27 (SUTURE) ×3 IMPLANT
SUT VIC AB 3-0 SH 27 (SUTURE)
SUT VIC AB 3-0 SH 27X BRD (SUTURE) IMPLANT
SUT VIC AB 3-0 X1 27 (SUTURE) IMPLANT
SUT VICRYL 4-0 PS2 18IN ABS (SUTURE) IMPLANT
SUTURE E-PAK OPEN HEART (SUTURE) ×3 IMPLANT
SYSTEM SAHARA CHEST DRAIN ATS (WOUND CARE) ×3 IMPLANT
SYSTEM SAHARA CHEST DRAIN RE-I (WOUND CARE) ×3 IMPLANT
TAPE CLOTH SURG 4X10 WHT LF (GAUZE/BANDAGES/DRESSINGS) ×9 IMPLANT
TOWEL OR 17X24 6PK STRL BLUE (TOWEL DISPOSABLE) ×6 IMPLANT
TOWEL OR 17X26 10 PK STRL BLUE (TOWEL DISPOSABLE) ×6 IMPLANT
TRAY FOLEY IC TEMP SENS 14FR (CATHETERS) ×3 IMPLANT
TUBING INSUFFLATION 10FT LAP (TUBING) ×3 IMPLANT
UNDERPAD 30X30 INCONTINENT (UNDERPADS AND DIAPERS) ×3 IMPLANT
WATER STERILE IRR 1000ML POUR (IV SOLUTION) ×6 IMPLANT

## 2012-03-28 NOTE — Progress Notes (Signed)
T[facility] BRIEF SICU PROGRESS NOTE  Day of Surgery  S/P Procedure(s) (LRB): CORONARY ARTERY BYPASS GRAFTING (CABG) (N/A)   Sedated on vent AAI paced  BP stable  CI 1.7 with PA pressures low on milrinone Chest tube output low UOP 50-65 mL/hr Labs okay  Plan: Continue routine early postop  OWEN,CLARENCE H 03/28/2012 8:48 PM

## 2012-03-28 NOTE — Anesthesia Postprocedure Evaluation (Signed)
  Anesthesia Post-op Note  Patient: Julia Santana  Procedure(s) Performed: Procedure(s) (LRB): CORONARY ARTERY BYPASS GRAFTING (CABG) (N/A)  Patient Location: SICU  Anesthesia Type: General  Level of Consciousness: sedated and Patient remains intubated per anesthesia plan  Airway and Oxygen Therapy: Patient remains intubated per anesthesia plan  Post-op Pain: none  Post-op Assessment: Post-op Vital signs reviewed, Patient's Cardiovascular Status Stable, Respiratory Function Stable, Patent Airway, No signs of Nausea or vomiting and Pain level controlled  Post-op Vital Signs: stable  Complications: No apparent anesthesia complications

## 2012-03-28 NOTE — Progress Notes (Signed)
1600  Dr. Prescott Gum notified of CO/CI.  Orders received for Dobutamine drip to start back up and to notify him if CI did not come up to 1.8 1630  SBP now 180's/90's on dobutamine at 48mcg/kg/min.  Rate dropped to 3mcg/kg/min. 1700  Dr. Prescott Gum notified of BP with dobutamine as well as CI.  Orders received to stop dobutamine gtt and to start on milrinone drip instead at 0.83mcg/kg/min.

## 2012-03-28 NOTE — Anesthesia Procedure Notes (Signed)
Procedure Name: Intubation Date/Time: 03/28/2012 8:03 AM Performed by: Jon Gills A Pre-anesthesia Checklist: Patient identified, Emergency Drugs available, Suction available and Patient being monitored Patient Re-evaluated:Patient Re-evaluated prior to inductionOxygen Delivery Method: Circle system utilized Preoxygenation: Pre-oxygenation with 100% oxygen Intubation Type: IV induction Ventilation: Mask ventilation without difficulty and Oral airway inserted - appropriate to patient size Laryngoscope Size: Mac and 4 Grade View: Grade I Tube type: Oral Tube size: 8.0 mm Number of attempts: 1 Airway Equipment and Method: Stylet Placement Confirmation: ETT inserted through vocal cords under direct vision,  positive ETCO2 and breath sounds checked- equal and bilateral Secured at: 22 cm Tube secured with: Tape Dental Injury: Teeth and Oropharynx as per pre-operative assessment

## 2012-03-28 NOTE — Anesthesia Preprocedure Evaluation (Addendum)
Anesthesia Evaluation  Patient identified by MRN, date of birth, ID band Patient awake    Reviewed: Allergy & Precautions, H&P , Patient's Chart, lab work & pertinent test results  Airway Mallampati: I TM Distance: >3 FB Neck ROM: full    Dental   Pulmonary asthma ,          Cardiovascular hypertension, + angina + CAD Rhythm:regular Rate:Normal     Neuro/Psych    GI/Hepatic GERD-  ,  Endo/Other    Renal/GU CRF     Musculoskeletal   Abdominal   Peds  Hematology   Anesthesia Other Findings   Reproductive/Obstetrics                           Anesthesia Physical Anesthesia Plan  ASA: III  Anesthesia Plan: General   Post-op Pain Management:    Induction: Intravenous  Airway Management Planned: Oral ETT  Additional Equipment: Arterial line, CVP, PA Cath and TEE  Intra-op Plan:   Post-operative Plan: Post-operative intubation/ventilation  Informed Consent: I have reviewed the patients History and Physical, chart, labs and discussed the procedure including the risks, benefits and alternatives for the proposed anesthesia with the patient or authorized representative who has indicated his/her understanding and acceptance.     Plan Discussed with: CRNA, Anesthesiologist and Surgeon  Anesthesia Plan Comments:         Anesthesia Quick Evaluation

## 2012-03-28 NOTE — Transfer of Care (Signed)
Immediate Anesthesia Transfer of Care Note  Patient: Julia Santana  Procedure(s) Performed: Procedure(s) (LRB): CORONARY ARTERY BYPASS GRAFTING (CABG) (N/A)  Patient Location: SICU  Anesthesia Type: General  Level of Consciousness: Patient remains intubated per anesthesia plan  Airway & Oxygen Therapy: Patient remains intubated per anesthesia plan and Patient placed on Ventilator (see vital sign flow sheet for setting)  Post-op Assessment: Post -op Vital signs reviewed and stable  Post vital signs: Reviewed and stable  Complications: No apparent anesthesia complications

## 2012-03-28 NOTE — Brief Op Note (Signed)
03/22/2012 - 03/28/2012  11:24 AM  PATIENT:  Julia Santana  71 y.o. female  PRE-OPERATIVE DIAGNOSIS:  CAD  POST-OPERATIVE DIAGNOSIS:  Coronary Artery Disease  PROCEDURE:  Procedure(s): CORONARY ARTERY BYPASS GRAFTING x 3 (LIMA-LAD, SVG-OM1, SVG-OM2), EVH R thigh  SURGEON:  Surgeon(s): Ivin Poot, MD  ASSISTANT: Suzzanne Cloud, PA-C  ANESTHESIA:   general  PATIENT CONDITION:  ICU - intubated and hemodynamically stable.  PRE-OPERATIVE WEIGHT: 92 kg

## 2012-03-28 NOTE — Preoperative (Signed)
Beta Blockers   Reason not to administer Beta Blockers:Not Applicable 

## 2012-03-28 NOTE — Progress Notes (Signed)
  Echocardiogram Echocardiogram Transesophageal (OR) has been performed.  Basilia Jumbo 03/28/2012, 9:14 AM

## 2012-03-29 ENCOUNTER — Encounter (HOSPITAL_COMMUNITY): Payer: Self-pay | Admitting: Cardiothoracic Surgery

## 2012-03-29 ENCOUNTER — Inpatient Hospital Stay (HOSPITAL_COMMUNITY): Payer: Medicare Other

## 2012-03-29 LAB — POCT I-STAT 3, ART BLOOD GAS (G3+)
Bicarbonate: 20.2 mEq/L (ref 20.0–24.0)
Bicarbonate: 20 mEq/L (ref 20.0–24.0)
Patient temperature: 37.4
Patient temperature: 37.2
TCO2: 21 mmol/L (ref 0–100)
TCO2: 21 mmol/L (ref 0–100)
pCO2 arterial: 37.2 mmHg (ref 35.0–45.0)
pCO2 arterial: 37.4 mmHg (ref 35.0–45.0)
pH, Arterial: 7.345 — ABNORMAL LOW (ref 7.350–7.450)
pH, Arterial: 7.337 — ABNORMAL LOW (ref 7.350–7.450)

## 2012-03-29 LAB — GLUCOSE, CAPILLARY
Glucose-Capillary: 100 mg/dL — ABNORMAL HIGH (ref 70–99)
Glucose-Capillary: 113 mg/dL — ABNORMAL HIGH (ref 70–99)
Glucose-Capillary: 141 mg/dL — ABNORMAL HIGH (ref 70–99)
Glucose-Capillary: 176 mg/dL — ABNORMAL HIGH (ref 70–99)
Glucose-Capillary: 215 mg/dL — ABNORMAL HIGH (ref 70–99)

## 2012-03-29 LAB — BASIC METABOLIC PANEL
CO2: 21 mEq/L (ref 19–32)
Calcium: 10.2 mg/dL (ref 8.4–10.5)
Chloride: 108 mEq/L (ref 96–112)
GFR calc Af Amer: 43 mL/min — ABNORMAL LOW (ref >90)
Sodium: 139 mEq/L (ref 135–145)

## 2012-03-29 LAB — POCT I-STAT, CHEM 8
BUN: 19 mg/dL (ref 6–23)
Creatinine, Ser: 1.6 mg/dL — ABNORMAL HIGH (ref 0.50–1.10)
Glucose, Bld: 154 mg/dL — ABNORMAL HIGH (ref 70–99)
Sodium: 138 mEq/L (ref 135–145)
TCO2: 20 mmol/L (ref 0–100)

## 2012-03-29 LAB — CBC
HCT: 27.3 % — ABNORMAL LOW (ref 36.0–46.0)
Hemoglobin: 9.1 g/dL — ABNORMAL LOW (ref 12.0–15.0)
MCH: 28.8 pg (ref 26.0–34.0)
MCH: 28.2 pg (ref 26.0–34.0)
MCHC: 33.3 g/dL (ref 30.0–36.0)
MCV: 84.5 fL (ref 78.0–100.0)
Platelets: 121 10*3/uL — ABNORMAL LOW (ref 150–400)
RBC: 3.2 MIL/uL — ABNORMAL LOW (ref 3.87–5.11)
RDW: 14.1 % (ref 11.5–15.5)
RDW: 14.7 % (ref 11.5–15.5)
WBC: 7.6 10*3/uL (ref 4.0–10.5)

## 2012-03-29 LAB — MAGNESIUM: Magnesium: 2.5 mg/dL (ref 1.5–2.5)

## 2012-03-29 MED ORDER — POTASSIUM CHLORIDE 10 MEQ/50ML IV SOLN
10.0000 meq | INTRAVENOUS | Status: AC
  Administered 2012-03-29 (×3): 10 meq via INTRAVENOUS
  Filled 2012-03-29: qty 50

## 2012-03-29 MED ORDER — FUROSEMIDE 10 MG/ML IJ SOLN: 40.0000 mg | Freq: Once | INTRAMUSCULAR | Status: DC

## 2012-03-29 MED ORDER — INSULIN ASPART 100 UNIT/ML ~~LOC~~ SOLN: 0.0000 [IU] | SUBCUTANEOUS | Status: DC

## 2012-03-29 MED ORDER — INSULIN GLARGINE 100 UNIT/ML ~~LOC~~ SOLN
14.0000 [IU] | Freq: Every day | SUBCUTANEOUS | Status: DC
  Administered 2012-03-29 – 2012-04-01 (×4): 14 [IU] via SUBCUTANEOUS

## 2012-03-29 MED ORDER — BUMETANIDE 0.25 MG/ML IJ SOLN
1.0000 mg | Freq: Two times a day (BID) | INTRAMUSCULAR | Status: DC
  Administered 2012-03-29 – 2012-04-01 (×8): 1 mg via INTRAVENOUS
  Filled 2012-03-29 (×11): qty 4

## 2012-03-29 MED ORDER — PANTOPRAZOLE SODIUM 40 MG PO TBEC
40.0000 mg | DELAYED_RELEASE_TABLET | Freq: Every day | ORAL | Status: DC
  Administered 2012-03-29 – 2012-04-03 (×6): 40 mg via ORAL
  Filled 2012-03-29 (×5): qty 1

## 2012-03-29 MED ORDER — TORSEMIDE 20 MG/2ML IV SOLN: 20.0000 mg | Freq: Every day | INTRAVENOUS | Status: DC

## 2012-03-29 MED FILL — Magnesium Sulfate Inj 50%: INTRAMUSCULAR | Qty: 10 | Status: AC

## 2012-03-29 MED FILL — Lidocaine HCl IV Inj 20 MG/ML: INTRAVENOUS | Qty: 5 | Status: AC

## 2012-03-29 MED FILL — Potassium Chloride Inj 2 mEq/ML: INTRAVENOUS | Qty: 40 | Status: AC

## 2012-03-29 MED FILL — Electrolyte-R (PH 7.4) Solution: INTRAVENOUS | Qty: 3000 | Status: AC

## 2012-03-29 MED FILL — Heparin Sodium (Porcine) Inj 1000 Unit/ML: INTRAMUSCULAR | Qty: 30 | Status: AC

## 2012-03-29 MED FILL — Mannitol IV Soln 20%: INTRAVENOUS | Qty: 500 | Status: AC

## 2012-03-29 MED FILL — Heparin Sodium (Porcine) Inj 1000 Unit/ML: INTRAMUSCULAR | Qty: 10 | Status: AC

## 2012-03-29 MED FILL — Sodium Bicarbonate IV Soln 8.4%: INTRAVENOUS | Qty: 50 | Status: AC

## 2012-03-29 MED FILL — Sodium Chloride IV Soln 0.9%: INTRAVENOUS | Qty: 1000 | Status: AC

## 2012-03-29 MED FILL — Sodium Chloride Irrigation Soln 0.9%: Qty: 3000 | Status: AC

## 2012-03-29 NOTE — Op Note (Signed)
NAMEMALASHIA, Julia Santana                ACCOUNT NO.:  [id number]  MEDICAL RECORD NO.:  [id number]  LOCATION:  2304                         FACILITY:  [facility]  PHYSICIAN:  Ivin Poot, M.D.  DATE OF BIRTH:  03-17-1941  DATE OF PROCEDURE:  03/28/2012 DATE OF DISCHARGE:                              OPERATIVE REPORT   OPERATION:  Coronary artery bypass grafting x3 (left internal mammary artery to left anterior descending, saphenous vein graft to obtuse marginal 1, saphenous vein graft to obtuse marginal 2).  SURGEON:  Ivin Poot, M.D.  ASSISTANT:  Suzzanne Cloud, P.A.-C.  PREOPERATIVE DIAGNOSES:  Acute non ST elevation myocardial infarction, multivessel coronary artery disease, unstable angina.  POSTOPERATIVE DIAGNOSES:  Acute non ST elevation myocardial infarction, multivessel coronary artery disease, unstable angina.  ANESTHESIA:  General by Dr. Kate Sable.  INDICATIONS:  The patient is a 71 year old hypertensive diabetic female who presented with chest pain, positive cardiac enzymes, was transferred in from [facility] with also some symptoms of renal insufficiency, elevated creatinine, and CHF on x-ray.  She underwent cardiac catheterizations, an echo, and right heart cath.  This information indicated severe LAD and circumflex stenosis, small nondominant right coronary, severe LV hypertrophy from hypertension, no significant valvular disease, mildly elevated right heart pressures with a cardiac index of 1.8.  PFTs demonstrated 50% of predicted FEV1 and diffusion capacity, and chest x-ray after diuresis improved and showed no active disease.  She had received a load of Plavix, so her surgery was scheduled for 3 days after admission.  I reviewed her preoperative studies including carotid Dopplers, which showed no significant disease, I discussed the procedure of coronary artery bypass surgery with the patient and her family.  I reviewed the expected postoperative  recovery, the potential risks of stroke, MI, renal failure, pneumonia, death, and she demonstrated her understanding and agreed to proceed with surgery under what I felt was an informed consent.  OPERATIVE FINDINGS:  The coronaries were diffusely diseased, but adequate targets.  The conduit was good.  The LV was severely hypertrophied by TEE; however, there was no evidence of septal hypertrophy or systolic anterior motion of the mitral valve leaflet. The patient did well following placement of bypass grafts, but needed 2 units of packed cells for anemia with hematocrit of 24 on presentation to the OR.  The patient exhibited signs of hypertrophic cardiomyopathy requiring high filling pressures after the chest was closed.  Low-dose of any sort of inotrope would immediately result in very high blood pressure.  We used volume and nitroglycerin and avoided ventricular pacing in order to stabilize her hemodynamics in the operating room.  PROCEDURE:  The patient was brought to the operating room and placed supine on the operating table.  General anesthesia was induced.  The chest, abdomen, and legs were prepped with Betadine and draped as a sterile field.  A sternal incision was made as the saphenous vein was harvested endoscopically from the right leg.  The left internal mammary artery was harvested as a pedicle graft.  The sternum was then retracted using the deep bladed retractor due to the patient's obese body habitus. The pericardium was opened and suspended.  Pursestrings were  placed in the ascending aorta, right atrium, and heparin was administered.  When the ACT was documented as being therapeutic, the patient was cannulated and placed on cardiopulmonary bypass.  The coronary arteries were identified for grafting and the mammary artery and vein grafts were prepared for the distal anastomoses.  Cardioplegia cannulas were placed for both antegrade and retrograde cold blood cardioplegia  and the patient was cooled to 32 degrees.  The aortic crossclamp was applied. 750 mL of cold blood cardioplegia was delivered in split doses between the antegrade aortic and retrograde coronary sinus catheters.  There was good cardioplegic arrest and septal temperature dropped to less than 12 degrees.  The distal coronary anastomoses were performed.  The first distal anastomosis was the OM 2.  This is a smaller 1.2-mm vessel proximal 80% stenosis.  A reverse saphenous vein was sewn end-to-side with running 7- 0 Prolene with good flow through the graft.  The second distal anastomosis was to the larger 1.5 mm OM 1.  This had a proximal 90% stenosis.  A reverse saphenous vein was sewn end-to-side with running 7- 0 Prolene with good flow through the graft.  Cardioplegia was redosed. The third distal anastomosis was to the distal LAD.  The LAD had diffuse diseased.  The left IMA pedicle was brought through an opening and left lateral pericardium was brought down onto the LAD and sewn end-to-side with running 8-0 Prolene.  There was excellent flow through the anastomosis after briefly releasing the pedicle bulldog and the mammary artery.  The bulldog was reapplied and the pedicle was secured to the epicardium with 6-0 Prolenes.  Cardioplegia was redosed.  While the crossclamp was still in place, two proximal vein anastomoses were performed on the ascending aorta using a 4.0 mm punch and running 7- 0 Prolene.  Air was vented from the coronaries with a dose of retrograde warm blood cardioplegia in the usual de-airing maneuvers on bypass.  The cross-clamp was then removed.  Heart resumed a spontaneous rhythm.  The grafts were opened and each had good flow.  Hemostasis was documented in the proximal and distal anastomoses.  Temporary pacing wires were applied and the patient was rewarmed to 37 degrees.  The lungs were expanded and the ventilator was resumed.  The patient was then weaned off  cardiopulmonary bypass without difficulty and without inotropes.  Protamine was administered without adverse reaction.  The cannulas were removed.  The mediastinum was irrigated with warm saline.  The superior pericardial fat was closed over the aorta.  An anterior mediastinal and left pleural chest tube were placed and brought out through separate incisions.  The sternum was closed with interrupted steel wire.  After the first closure of the sternum, the blood pressure dropped to the 70s and there was evidence of ventricular underfilling on echo with fairly well-preserved global systolic function.  The wires were removed, and the pressure immediately became normalized.  The patient was given more volume in the way of 5% albumin and sternal wires were then replaced and the sternum was slowly closed and the patient tolerated this with more filling pressures.  The patient remained hemodynamically stable and the echo showed good LV function.  The pectoralis fascia was closed with running #1 Vicryl. Subcutaneous and skin layers were closed with running Vicryl.  Total cardiopulmonary bypass time was 95 minutes and the patient returned to the ICU in critical, but stable condition.    Ivin Poot, M.D.    PV/MEDQ  D:  03/28/2012  T:  03/29/2012  Job:  [id number]  cc:   Loretha Brasil. Lia Foyer, MD, [facility]

## 2012-03-29 NOTE — Progress Notes (Signed)
[address]            [place] [address]          [phone]     1 Day Post-Op Procedure(s) (LRB): CORONARY ARTERY BYPASS GRAFTING (CABG) (N/A)  Subjective: Awake and alert.  Sore, otherwise no complaints.   Objective: Vital signs in last 24 hours: Patient Vitals for the past 24 hrs:  BP Temp Pulse Resp SpO2 Weight  03/29/12 0730 118/61 mmHg 99.1 F (37.3 C) 102  20  100 % -  03/29/12 0700 123/60 mmHg 99.1 F (37.3 C) 98  20  100 % 216 lb 7.9 oz (98.2 kg)  03/29/12 0630 - - 104  22  99 % -  03/29/12 0600 112/60 mmHg 99 F (37.2 C) 104  20  99 % -  03/29/12 0530 114/56 mmHg 99 F (37.2 C) 103  19  99 % -  03/29/12 0500 121/68 mmHg - 104  21  100 % -  03/29/12 0430 126/67 mmHg - 103  21  100 % -  03/29/12 0400 159/70 mmHg - 105  26  100 % -  03/29/12 0330 140/66 mmHg - 99  19  100 % -  03/29/12 0300 121/63 mmHg - 98  21  100 % -  03/29/12 0230 131/58 mmHg - 99  - 100 % -  03/29/12 0217 - - - - 99 % -  03/29/12 0215 141/57 mmHg 99.1 F (37.3 C) 97  - 100 % -  03/29/12 0200 136/62 mmHg 99.1 F (37.3 C) 100  - 99 % -  03/29/12 0130 130/60 mmHg 99.3 F (37.4 C) 92  23  100 % -  03/29/12 0115 166/80 mmHg 99.1 F (37.3 C) 97  18  100 % -  03/29/12 0100 112/55 mmHg 99.3 F (37.4 C) 92  21  100 % -  03/29/12 0045 117/54 mmHg 99.3 F (37.4 C) 92  20  100 % -  03/29/12 0030 108/51 mmHg 99.3 F (37.4 C) 90  13  100 % -  03/29/12 0015 110/55 mmHg 99.1 F (37.3 C) 90  12  100 % -  03/29/12 0000 100/58 mmHg 99.1 F (37.3 C) 90  12  100 % -  03/28/12 2345 - 99.1 F (37.3 C) 90  12  100 % -  03/28/12 2330 120/59 mmHg 99.1 F (37.3 C) 87  14  100 % -  03/28/12 2315 113/60 mmHg 99 F (37.2 C) 86  12  100 % -  03/28/12 2300 123/58 mmHg 99 F (37.2 C) 86  12  100 % -  03/28/12 2245 126/64 mmHg 98.6 F (37 C) 90  16  100 % -  03/28/12 2244 - 98.6 F (37 C) 84  17  100 % -  03/28/12 2230 99/57 mmHg 99 F (37.2 C) 81  12  100 % -    03/28/12 2215 106/59 mmHg 99 F (37.2 C) - 13  - -  03/28/12 2200 104/58 mmHg 99 F (37.2 C) 84  12  100 % -  03/28/12 2145 126/69 mmHg 99 F (37.2 C) 83  15  100 % -  03/28/12 2130 124/55 mmHg 98.8 F (37.1 C) 85  17  100 % -  03/28/12 2115 135/64 mmHg 98.6 F (37 C) 83  15  100 % -  03/28/12 2113 125/60 mmHg 98.6  F (37 C) 81  17  100 % -  03/28/12 2112 - - - - 100 % -  03/28/12 2100 125/60 mmHg 98.6 F (37 C) 81  15  100 % -  03/28/12 2045 122/56 mmHg 98.6 F (37 C) 80  12  100 % -  03/28/12 2030 121/64 mmHg 98.8 F (37.1 C) 80  13  100 % -  03/28/12 2015 138/65 mmHg 98.8 F (37.1 C) 80  16  100 % -  03/28/12 2000 143/64 mmHg 98.8 F (37.1 C) 81  20  100 % -  03/28/12 1945 - 98.8 F (37.1 C) 80  16  100 % -  03/28/12 1930 113/60 mmHg 99 F (37.2 C) 80  13  100 % -  03/28/12 1915 - 99 F (37.2 C) 80  12  100 % -  03/28/12 1900 139/67 mmHg 99 F (37.2 C) 80  12  100 % -  03/28/12 1848 170/69 mmHg - 82  12  100 % -  03/28/12 1845 - 98.8 F (37.1 C) 81  12  100 % -  03/28/12 1830 152/88 mmHg 98.6 F (37 C) 82  14  100 % -  03/28/12 1815 - 98.4 F (36.9 C) 83  17  100 % -  03/28/12 1800 - 98.2 F (36.8 C) 80  15  100 % -  03/28/12 1745 - 98.1 F (36.7 C) 80  12  100 % -  03/28/12 1730 - 97.7 F (36.5 C) 85  14  100 % -  03/28/12 1715 - 97.3 F (36.3 C) 80  12  100 % -  03/28/12 1700 - 97 F (36.1 C) 80  12  100 % -  03/28/12 1645 - 96.6 F (35.9 C) 80  12  100 % -  03/28/12 1630 - 96.3 F (35.7 C) 79  12  100 % -  03/28/12 1615 - 95.7 F (35.4 C) 79  12  100 % -  03/28/12 1600 - 95.4 F (35.2 C) 80  12  100 % -  03/28/12 1545 - 95 F (35 C) 80  12  100 % -  03/28/12 1530 - 94.8 F (34.9 C) 80  12  100 % -  03/28/12 1515 - 94.5 F (34.7 C) 80  12  100 % -  03/28/12 1500 - 94.3 F (34.6 C) 80  12  100 % -  03/28/12 1445 - 94.1 F (34.5 C) 80  12  100 % -  03/28/12 1433 - - - - 100 % -  03/28/12 1430 - 94.3 F (34.6 C) 80  12  100 % -  03/28/12 1415  94/50 mmHg 94.5 F (34.7 C) 80  12  100 % -   Current Weight  03/29/12 216 lb 7.9 oz (98.2 kg)   PRE-OPERATIVE WEIGHT: 92 kg   Intake/Output from previous day: 08/01 0701 - 08/02 0700 In: 7987.3 [I.V.:5765.3; Blood:372; IV Piggyback:1850] Out: 3605 [Urine:2565; Blood:650; Chest Tube:390]  CBGs 108-99-100-108-113-147-141-140-176-193  Drips: Nitroglycerin, Milrinone, Nipride   PHYSICAL EXAM:  Heart: RRR, tachy around 100 Lungs: slightly diminished BS in bases Wound: dressed and dry Extremities: mild LE edema    Lab Results: CBC: Basename 03/29/12 0420 03/28/12 2015  WBC 7.6 9.2  HGB 9.2* 10.1*  HCT 26.7* 29.3*  PLT 121* 131*   BMET:  Basename 03/29/12 0420 03/28/12 2015 03/28/12 2011 03/27/12 1456  NA 139 -- 140 --  K 3.6 -- 4.2 --  CL  108 -- 111 --  CO2 21 -- -- 24  GLUCOSE 193* -- 147* --  BUN 17 -- 16 --  CREATININE 1.40* 1.34* -- --  CALCIUM 10.2 -- -- 11.5*    PT/INR:  Basename 03/28/12 1422  LABPROT 18.0*  INR 1.46    CXR: small bilateral effusions/atelectasis, R>L  Assessment/Plan: S/P Procedure(s) (LRB): CORONARY ARTERY BYPASS GRAFTING (CABG) (N/A)  CV- Still on low dose Ntg/Nipride/Milrinone.  Wean and d/c gtts today as tolerated and start beta blocker.  Not pacing-currently SR/ST around 100.  Elevated CBGs- sugars relatively stable.  Will need to stop insulin gtt and start Lantus this am. A1C=5.9  Vol overload- diurese.  Acute/chronic renal insufficiency- Cr up slightly.  Will monitor.  Expected post-op blood loss anemia- monitor.  Mobilize, routine POD#1 progression.   LOS: 7 days    Verina Galeno H 03/29/2012

## 2012-03-29 NOTE — Progress Notes (Signed)
1 Day Post-Op Procedure(s) (LRB): CORONARY ARTERY BYPASS GRAFTING (CABG) (N/A)                    [address]            [place] [address]          [phone]      Subjective: pod1 CABG Hypertrophic LV from sig HTN- large vol requirements periop  Objective: Vital signs in last 24 hours: Temp:  [94.1 F (34.5 C)-99.3 F (37.4 C)] 99.1 F (37.3 C) (08/02 0730) Pulse Rate:  [79-105] 102  (08/02 0730) Cardiac Rhythm:  [-] Normal sinus rhythm (08/02 0300) Resp:  [12-26] 20  (08/02 0730) BP: (94-170)/(50-88) 118/61 mmHg (08/02 0730) SpO2:  [99 %-100 %] 100 % (08/02 0819) Arterial Line BP: (99-214)/(47-102) 147/55 mmHg (08/02 0730) FiO2 (%):  [39.9 %-50.5 %] 39.9 % (08/02 0100) Weight:  [216 lb 7.9 oz (98.2 kg)] 216 lb 7.9 oz (98.2 kg) (08/02 0700)  Hemodynamic parameters for last 24 hours: PAP: (22-50)/(10-26) 29/13 mmHg CO:  [2.8 L/min-5.4 L/min] 5.4 L/min CI:  [1.4 L/min/m2-2.7 L/min/m2] 2.7 L/min/m2  Intake/Output from previous day: 08/01 0701 - 08/02 0700 In: 7987.3 [I.V.:5765.3; Blood:372; IV Piggyback:1850] Out: 3605 [Urine:2565; Blood:650; Chest Tube:390] Intake/Output this shift:  NSR Neuro intact Lungs wet  Lab Results:  [facility] 03/29/12 0420 03/28/12 2015  WBC 7.6 9.2  HGB 9.2* 10.1*  HCT 26.7* 29.3*  PLT 121* 131*   BMET:  Basename 03/29/12 0420 03/28/12 2015 03/28/12 2011 03/27/12 1456  NA 139 -- 140 --  K 3.6 -- 4.2 --  CL 108 -- 111 --  CO2 21 -- -- 24  GLUCOSE 193* -- 147* --  BUN 17 -- 16 --  CREATININE 1.40* 1.34* -- --  CALCIUM 10.2 -- -- 11.5*    PT/INR:  Basename 03/28/12 1422  LABPROT 18.0*  INR 1.46   ABG    Component Value Date/Time   PHART 7.337* 03/29/2012 0223   HCO3 20.0 03/29/2012 0223   TCO2 21 03/29/2012 0223   ACIDBASEDEF 5.0* 03/29/2012 0223   O2SAT 98.0 03/29/2012 0223   CBG (last 3)   Basename 03/29/12 0757 03/29/12 0411 03/28/12 2350  GLUCAP 163* 176* 140*    Assessment/Plan: S/P Procedure(s)  (LRB): CORONARY ARTERY BYPASS GRAFTING (CABG) (N/A) Diuresis, OOB to chair,lasix,wean off mil   LOS: 7 days    Julia Santana,Julia Santana 03/29/2012

## 2012-03-29 NOTE — Progress Notes (Signed)
PROGRESS NOTE  Subjective:   Pt is s/p CABG.  Doing well   Objective:    Vital Signs:   Temp:  [95 F (35 C)-99.3 F (37.4 C)] 98.8 F (37.1 C) (08/02 1230) Pulse Rate:  [79-108] 87  (08/02 1500) Resp:  [12-26] 17  (08/02 1500) BP: (98-170)/(48-94) 115/53 mmHg (08/02 1500) SpO2:  [96 %-100 %] 100 % (08/02 1500) Arterial Line BP: (99-214)/(41-102) 128/47 mmHg (08/02 1500) FiO2 (%):  [39.9 %-50.5 %] 39.9 % (08/02 0100) Weight:  [216 lb 7.9 oz (98.2 kg)] 216 lb 7.9 oz (98.2 kg) (08/02 0700)  Last BM Date: 03/26/12   24-hour weight change: Weight change: 16 lb 12.7 oz (7.617 kg)  Weight trends: Filed Weights   06-Apr-2012 0500 2012/04/06 0554 03/29/12 0700  Weight: 199 lb 11.2 oz (90.583 kg) 201 lb 9.6 oz (91.445 kg) 216 lb 7.9 oz (98.2 kg)    Intake/Output:  04/07/23 0701 - 08/02 0700 In: 7987.3 [I.V.:5765.3; Blood:372; IV Piggyback:1850] Out: [id number] [Urine:2565; Blood:650; Chest Tube:390] Total I/O In: 827 [P.O.:360; I.V.:63; IV Piggyback:404] Out: 450 [Urine:350; Chest Tube:100]   Physical Exam: BP 115/53  Pulse 87  Temp 98.8 F (37.1 C) (Oral)  Resp 17  Ht 5\' 6"  (1.676 m)  Wt 216 lb 7.9 oz (98.2 kg)  BMI 34.94 kg/m2  SpO2 100%  General: Vital signs reviewed and noted. Well-developed, well-nourished, in no acute distress; alert, appropriate and cooperative .  Head: Normocephalic, atraumatic.  Eyes: conjunctivae/corneas clear.  EOM's intact.   Throat: normal  Neck: Supple. Normal carotids. No JVD  Lungs:  Clear to auscultation  Heart: RR. No rub heard   Abdomen:  Soft, non-tender, non-distended with normoactive bowel sounds. No hepatomegaly. No rebound/guarding. No abdominal masses.  Extremities: Distal pedal pulses are 2+ .  No edema.    Neurologic: A&O X3, CN II - XII are grossly intact. Motor strength is 5/5 in the all 4 extremities.  Psych: Responds to questions appropriately with normal affect.    Labs: BMET:  Basename 03/29/12 0420 04-06-2012 2015  04/06/12 2011 03/27/12 1456  NA 139 -- 140 --  K 3.6 -- 4.2 --  CL 108 -- 111 --  CO2 21 -- -- 24  GLUCOSE 193* -- 147* --  BUN 17 -- 16 --  CREATININE 1.40* 1.34* -- --  CALCIUM 10.2 -- -- 11.5*  MG 2.5 2.9* -- --  PHOS -- -- -- --    Liver function tests: No results found for this basename: AST:2,ALT:2,ALKPHOS:2,BILITOT:2,PROT:2,ALBUMIN:2 in the last 72 hours No results found for this basename: LIPASE:2,AMYLASE:2 in the last 72 hours  CBC:  Basename 03/29/12 0420 04/06/12 2015  WBC 7.6 9.2  NEUTROABS -- --  HGB 9.2* 10.1*  HCT 26.7* 29.3*  MCV 83.4 82.5  PLT 121* 131*    Cardiac Enzymes: No results found for this basename: CKTOTAL:4,CKMB:4,TROPONINI:4 in the last 72 hours  Coagulation Studies:  Basename April 06, 2012 1422 2012/04/06 0024  LABPROT 18.0* 14.3  INR 1.46 1.09    Tele:  NSR, mild diffuse ST elevation on 12 lead.  Medications:    Infusions:    . sodium chloride Stopped (03/29/12 0700)  . sodium chloride Stopped (06-Apr-2012 2000)  . sodium chloride    . lactated ringers 20 mL/hr at 03/29/12 1500  . nitroGLYCERIN Stopped (03/29/12 1200)  . nitroPRUSSide Stopped (03/29/12 1100)  . phenylephrine (NEO-SYNEPHRINE) Adult infusion Stopped (2012/04/06 1445)  . DISCONTD: dexmedetomidine Stopped (04/06/12 1715)  . DISCONTD: DOBUTamine Stopped (04-06-12 1700)  .  DISCONTD: insulin (NOVOLIN-R) infusion Stopped (03/28/12 1900)  . DISCONTD: milrinone Stopped (03/29/12 1000)  . DISCONTD: milrinone 0.3 mcg/kg/min (03/29/12 0700)    Scheduled Medications:    . acetaminophen  1,000 mg Oral Q6H   Or  . acetaminophen (TYLENOL) oral liquid 160 mg/5 mL  975 mg Per Tube Q6H  . aspirin EC  325 mg Oral Daily   Or  . aspirin  324 mg Per Tube Daily  . bisacodyl  10 mg Oral Daily   Or  . bisacodyl  10 mg Rectal Daily  . bumetanide (BUMEX) IV  1 mg Intravenous BID  . cefUROXime (ZINACEF)  IV  1.5 g Intravenous Q12H  . docusate sodium  200 mg Oral Daily  . famotidine  (PEPCID) IV  20 mg Intravenous Q12H  . insulin aspart  0-24 Units Subcutaneous Q2H   Followed by  . insulin aspart  0-24 Units Subcutaneous Q4H  . insulin glargine  14 Units Subcutaneous Daily  . labetalol  10 mg Intravenous Once  . levalbuterol  1.25 mg Nebulization Q6H  . magnesium sulfate  4 g Intravenous Once  . metoprolol tartrate  12.5 mg Oral BID   Or  . metoprolol tartrate  12.5 mg Per Tube BID  . pantoprazole  40 mg Oral Q1200  . potassium chloride  10 mEq Intravenous Q1 Hr x 3  . potassium chloride  10 mEq Intravenous Q1 Hr x 3  . pravastatin  20 mg Oral Daily  . sodium chloride  3 mL Intravenous Q12H  . tiotropium  18 mcg Inhalation Daily  . vancomycin  1,000 mg Intravenous Q12H  . DISCONTD: furosemide  40 mg Intravenous Once  . DISCONTD: insulin aspart  0-24 Units Subcutaneous Q4H  . DISCONTD: insulin regular  0-10 Units Intravenous TID WC  . DISCONTD: pantoprazole  40 mg Oral Q1200  . DISCONTD: torsemide  20 mg Intravenous Daily    Assessment/ Plan:     Unstable angina (03/23/2012) S/p CABG - doing well.  I suspect she has some mild pericarditis from CABG.    Rhythm is stable.   Disposition: continue current care. Length of Stay: 7  Thayer Headings, Brooke Bonito., MD, [facility] 03/29/2012, 3:41 PM Office [phone] Pager [phone]

## 2012-03-30 ENCOUNTER — Inpatient Hospital Stay (HOSPITAL_COMMUNITY): Payer: Medicare Other

## 2012-03-30 LAB — BASIC METABOLIC PANEL
BUN: 22 mg/dL (ref 6–23)
BUN: 24 mg/dL — ABNORMAL HIGH (ref 6–23)
CO2: 24 mEq/L (ref 19–32)
CO2: 22 mEq/L (ref 19–32)
Calcium: 11 mg/dL — ABNORMAL HIGH (ref 8.4–10.5)
Calcium: 11.2 mg/dL — ABNORMAL HIGH (ref 8.4–10.5)
Chloride: 103 mEq/L (ref 96–112)
Chloride: 104 mEq/L (ref 96–112)
Creatinine, Ser: 1.74 mg/dL — ABNORMAL HIGH (ref 0.50–1.10)
Creatinine, Ser: 1.88 mg/dL — ABNORMAL HIGH (ref 0.50–1.10)
GFR calc Af Amer: 33 mL/min — ABNORMAL LOW (ref >90)
GFR calc Af Amer: 30 mL/min — ABNORMAL LOW (ref >90)
GFR calc non Af Amer: 28 mL/min — ABNORMAL LOW (ref >90)
GFR calc non Af Amer: 26 mL/min — ABNORMAL LOW (ref >90)
Glucose, Bld: 104 mg/dL — ABNORMAL HIGH (ref 70–99)
Glucose, Bld: 130 mg/dL — ABNORMAL HIGH (ref 70–99)
Potassium: 3.9 mEq/L (ref 3.5–5.1)
Potassium: 4 mEq/L (ref 3.5–5.1)
Sodium: 135 mEq/L (ref 135–145)
Sodium: 137 mEq/L (ref 135–145)

## 2012-03-30 LAB — CBC
HCT: 27.4 % — ABNORMAL LOW (ref 36.0–46.0)
HCT: 28 % — ABNORMAL LOW (ref 36.0–46.0)
Hemoglobin: 9.1 g/dL — ABNORMAL LOW (ref 12.0–15.0)
Hemoglobin: 9.3 g/dL — ABNORMAL LOW (ref 12.0–15.0)
MCH: 28.3 pg (ref 26.0–34.0)
MCH: 28.3 pg (ref 26.0–34.0)
MCHC: 33.2 g/dL (ref 30.0–36.0)
MCHC: 33.2 g/dL (ref 30.0–36.0)
MCV: 85.1 fL (ref 78.0–100.0)
MCV: 85.1 fL (ref 78.0–100.0)
Platelets: 112 10*3/uL — ABNORMAL LOW (ref 150–400)
Platelets: 134 10*3/uL — ABNORMAL LOW (ref 150–400)
RBC: 3.22 MIL/uL — ABNORMAL LOW (ref 3.87–5.11)
RBC: 3.29 MIL/uL — ABNORMAL LOW (ref 3.87–5.11)
RDW: 15 % (ref 11.5–15.5)
RDW: 15.1 % (ref 11.5–15.5)
WBC: 8.9 10*3/uL (ref 4.0–10.5)
WBC: 9.4 10*3/uL (ref 4.0–10.5)

## 2012-03-30 LAB — GLUCOSE, CAPILLARY
Glucose-Capillary: 100 mg/dL — ABNORMAL HIGH (ref 70–99)
Glucose-Capillary: 114 mg/dL — ABNORMAL HIGH (ref 70–99)
Glucose-Capillary: 137 mg/dL — ABNORMAL HIGH (ref 70–99)

## 2012-03-30 LAB — CARDIAC PANEL(CRET KIN+CKTOT+MB+TROPI)
CK, MB: 6.2 ng/mL (ref 0.3–4.0)
Relative Index: 2.6 — ABNORMAL HIGH (ref 0.0–2.5)
Total CK: 235 U/L — ABNORMAL HIGH (ref 7–177)
Troponin I: 2.38 ng/mL (ref <0.30)

## 2012-03-30 MED ORDER — ENOXAPARIN SODIUM 30 MG/0.3ML ~~LOC~~ SOLN
30.0000 mg | SUBCUTANEOUS | Status: DC
  Administered 2012-03-30 – 2012-04-03 (×5): 30 mg via SUBCUTANEOUS
  Filled 2012-03-30 (×6): qty 0.3

## 2012-03-30 MED ORDER — ENOXAPARIN SODIUM 40 MG/0.4ML ~~LOC~~ SOLN
40.0000 mg | SUBCUTANEOUS | Status: DC
  Filled 2012-03-30: qty 0.4

## 2012-03-30 MED ORDER — LEVALBUTEROL HCL 1.25 MG/0.5ML IN NEBU
1.2500 mg | INHALATION_SOLUTION | Freq: Four times a day (QID) | RESPIRATORY_TRACT | Status: DC | PRN
  Filled 2012-03-30: qty 0.5

## 2012-03-30 NOTE — Progress Notes (Signed)
Critical EKG viewed by Prescott Gum, MD. Will continue to monitor.

## 2012-03-30 NOTE — H&P (Signed)
The patient underwent cardiac cath today set up by Dr. Domenic Polite.  She needs CABG as she has class III-IV symptoms, and severe CAD.  Given her underlying medical issues, it is the appropriate choice for management.  In that she has had pain even at rest the past few days, we plan admission to the hospital for surgical consultation and mangement.

## 2012-03-30 NOTE — Progress Notes (Signed)
2 Days Post-Op Procedure(s) (LRB): CORONARY ARTERY BYPASS GRAFTING (CABG) (N/A) Subjective Postop CABG x3 for unstable angina, severe LVH to to hypertension Fluid overload weight of 10 pounds chest x-ray wet on IV Bumex 3 times a day EKG shows diffuse ST segment mild changes consistent with endocarditis-pericarditis we'll check enzymes Blood pressure postop 123456 systolic  Objective: Vital signs in last 24 hours: Temp:  [97.9 F (36.6 C)-99.1 F (37.3 C)] 97.9 F (36.6 C) (08/03 0736) Pulse Rate:  [84-103] 93  (08/03 1003) Cardiac Rhythm:  [-] Normal sinus rhythm (08/03 0800) Resp:  [14-25] 15  (08/03 1000) BP: (91-141)/(48-69) 111/53 mmHg (08/03 1003) SpO2:  [95 %-100 %] 97 % (08/03 1000) Arterial Line BP: (112-163)/(41-60) 154/54 mmHg (08/02 1900) Weight:  [219 lb 12.8 oz (99.7 kg)] 219 lb 12.8 oz (99.7 kg) (08/03 0600)  Hemodynamic parameters for last 24 hours: PAP: (29-37)/(14-19) 36/19 mmHg CO:  [4.4 L/min] 4.4 L/min CI:  [2.2 L/min/m2] 2.2 L/min/m2  Intake/Output from previous day: 08/02 0701 - 08/03 0700 In: 1461 [P.O.:720; I.V.:83; IV Piggyback:658] Out: 1225 [Urine:1125; Chest Tube:100] Intake/Output this shift: Total I/O In: 40 [I.V.:40] Out: 40 [Urine:40]  Lungs with diminished breath sounds Generalized edema Neuro intact  Lab Results:  Basename 03/30/12 0420 03/29/12 1713 03/29/12 1700  WBC 8.9 -- 8.7  HGB 9.1* 8.8* --  HCT 27.4* 26.0* --  PLT 112* -- 122*   BMET:  Basename 03/30/12 0420 03/29/12 1713 03/29/12 0420  NA 135 138 --  K 3.9 4.1 --  CL 103 108 --  CO2 24 -- 21  GLUCOSE 104* 154* --  BUN 22 19 --  CREATININE 1.74* 1.60* --  CALCIUM 11.0* -- 10.2    PT/INR:  Basename 03/28/12 1422  LABPROT 18.0*  INR 1.46   ABG    Component Value Date/Time   PHART 7.337* 03/29/2012 0223   HCO3 20.0 03/29/2012 0223   TCO2 20 03/29/2012 1713   ACIDBASEDEF 5.0* 03/29/2012 0223   O2SAT 98.0 03/29/2012 0223   CBG (last 3)   Basename 03/30/12 0731  03/30/12 0422 03/30/12 0003  GLUCAP 114* 100* 161*    Assessment/Plan: S/P Procedure(s) (LRB): CORONARY ARTERY BYPASS GRAFTING (CABG) (N/A) Follow blood pressure, follow rising creatinine 1.7   LOS: 8 days    VAN TRIGT III,PETER 03/30/2012

## 2012-03-30 NOTE — Progress Notes (Signed)
Primary cardiologist: Dr. Domenic Polite  Subjective: Chest discomfort better after removal of chest tube.   Objective: Blood pressure 111/53, pulse 93, temperature 97.9 F (36.6 C), temperature source Oral, resp. rate 15, height 5\' 6"  (1.676 m), weight 219 lb 12.8 oz (99.7 kg), SpO2 97.00%.  Intake/Output Summary (Last 24 hours) at 03/30/12 1142 Last data filed at 03/30/12 1004  Gross per 24 hour  Intake    872 ml  Output   1155 ml  Net   -283 ml   Telemetry - Sinus rhythm.  Exam -   General - NAD.  Lungs - Decreased BS.  Cardiac - RRR, no gallop or obvious rub.  Extremities - No pitting.  Lab Results -  Basic Metabolic Panel:  Lab [id number] 03/29/12 1713 03/29/12 1700 03/29/12 0420 03/28/12 2015 03/27/12 1456  NA 135 138 -- 139 -- --  K 3.9 4.1 -- 3.6 -- --  CL 103 108 -- 108 -- --  CO2 24 -- -- 21 -- 24  GLUCOSE 104* 154* -- 193* -- --  BUN 22 19 -- 17 -- --  CREATININE 1.74* 1.60* 1.53* -- -- --  CALCIUM 11.0* -- -- 10.2 -- 11.5*  MG -- -- 2.4 2.5 2.9* --    Liver Function Tests:  Lab 03/24/12 0635  AST 13  ALT 9  ALKPHOS 74  BILITOT 0.3  PROT 6.0  ALBUMIN 3.2*    CBC:  Lab 03/30/12 0420 03/29/12 1713 03/29/12 1700 03/29/12 0420  WBC 8.9 -- 8.7 7.6  HGB 9.1* 8.8* 9.1* --  HCT 27.4* 26.0* 27.3* --  MCV 85.1 -- 84.5 83.4  PLT 112* -- 122* 121*    Cardiac Enzymes:  Lab 03/30/12 1025  CKTOTAL 235*  CKMB 6.2*  CKMBINDEX --  TROPONINI 2.38*    ECG - Sinus rhythm with persistent ST elevation anterolateral leads and II.  Current Medications    . acetaminophen  1,000 mg Oral Q6H   Or  . acetaminophen (TYLENOL) oral liquid 160 mg/5 mL  975 mg Per Tube Q6H  . aspirin EC  325 mg Oral Daily   Or  . aspirin  324 mg Per Tube Daily  . bisacodyl  10 mg Oral Daily   Or  . bisacodyl  10 mg Rectal Daily  . bumetanide (BUMEX) IV  1 mg Intravenous BID  . cefUROXime (ZINACEF)  IV  1.5 g Intravenous Q12H  . docusate sodium  200 mg Oral Daily    . enoxaparin  30 mg Subcutaneous Q24H  . insulin aspart  0-24 Units Subcutaneous Q4H  . insulin glargine  14 Units Subcutaneous Daily  . levalbuterol  1.25 mg Nebulization Q6H  . metoprolol tartrate  12.5 mg Oral BID   Or  . metoprolol tartrate  12.5 mg Per Tube BID  . pantoprazole  40 mg Oral Q1200  . pravastatin  20 mg Oral Daily  . sodium chloride  3 mL Intravenous Q12H  . tiotropium  18 mcg Inhalation Daily  . vancomycin  1,000 mg Intravenous Q12H  . DISCONTD: enoxaparin  40 mg Subcutaneous Q24H  . DISCONTD: pantoprazole  40 mg Oral Q1200    Assessment:  1. MVCAD s/p CABG, POD 2.  2. Abnormal ECG, possibly post-op pericarditis with somewhat diffuse ST changes. Troponin up at 2.3 - need to follow closely. She has been hemodynamically stable.  3. HL by history, lipids pending. Previously intolerant to Lipitor and Crestor.  Plan:  On ASA, metoprolol, pravachol, Lovenox. Advance beta  blocker as BP allows. Follow serial cardiac markers and ECG. May need a followup echocardiogram. Will follow.  Satira Sark, M.D., F.A.C.C.

## 2012-03-31 ENCOUNTER — Inpatient Hospital Stay (HOSPITAL_COMMUNITY): Payer: Medicare Other

## 2012-03-31 LAB — GLUCOSE, CAPILLARY
Glucose-Capillary: 95 mg/dL (ref 70–99)
Glucose-Capillary: 82 mg/dL (ref 70–99)
Glucose-Capillary: 114 mg/dL — ABNORMAL HIGH (ref 70–99)
Glucose-Capillary: 85 mg/dL (ref 70–99)

## 2012-03-31 LAB — TYPE AND SCREEN
ABO/RH(D): O POS
Antibody Screen: NEGATIVE
Unit division: 0
Unit division: 0
Unit division: 0
Unit division: 0
Unit division: 0
Unit division: 0

## 2012-03-31 LAB — COMPREHENSIVE METABOLIC PANEL
ALT: 20 U/L (ref 0–35)
AST: 18 U/L (ref 0–37)
Albumin: 2.9 g/dL — ABNORMAL LOW (ref 3.5–5.2)
Alkaline Phosphatase: 57 U/L (ref 39–117)
BUN: 25 mg/dL — ABNORMAL HIGH (ref 6–23)
CO2: 23 mEq/L (ref 19–32)
Calcium: 11.2 mg/dL — ABNORMAL HIGH (ref 8.4–10.5)
Chloride: 103 mEq/L (ref 96–112)
Creatinine, Ser: 1.83 mg/dL — ABNORMAL HIGH (ref 0.50–1.10)
GFR calc Af Amer: 31 mL/min — ABNORMAL LOW (ref >90)
GFR calc non Af Amer: 27 mL/min — ABNORMAL LOW (ref >90)
Glucose, Bld: 98 mg/dL (ref 70–99)
Potassium: 4 mEq/L (ref 3.5–5.1)
Sodium: 136 mEq/L (ref 135–145)
Total Bilirubin: 0.6 mg/dL (ref 0.3–1.2)
Total Protein: 5.8 g/dL — ABNORMAL LOW (ref 6.0–8.3)

## 2012-03-31 LAB — CBC
HCT: 26.2 % — ABNORMAL LOW (ref 36.0–46.0)
Hemoglobin: 8.7 g/dL — ABNORMAL LOW (ref 12.0–15.0)
MCH: 28.5 pg (ref 26.0–34.0)
MCHC: 33.2 g/dL (ref 30.0–36.0)
MCV: 85.9 fL (ref 78.0–100.0)
Platelets: 106 10*3/uL — ABNORMAL LOW (ref 150–400)
RBC: 3.05 MIL/uL — ABNORMAL LOW (ref 3.87–5.11)
RDW: 15.2 % (ref 11.5–15.5)
WBC: 7.4 10*3/uL (ref 4.0–10.5)

## 2012-03-31 LAB — CARDIAC PANEL(CRET KIN+CKTOT+MB+TROPI)
CK, MB: 3.3 ng/mL (ref 0.3–4.0)
CK, MB: 2.7 ng/mL (ref 0.3–4.0)
Relative Index: 2.6 — ABNORMAL HIGH (ref 0.0–2.5)
Relative Index: 2.5 (ref 0.0–2.5)
Total CK: 129 U/L (ref 7–177)
Total CK: 107 U/L (ref 7–177)
Troponin I: 1.18 ng/mL (ref <0.30)
Troponin I: 1.13 ng/mL (ref <0.30)
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
## 2012-03-31 NOTE — Progress Notes (Signed)
3 Days Post-Op Procedure(s) (LRB): CORONARY ARTERY BYPASS GRAFTING (CABG) (N/A) Subjective:  CABG x3 for unstable angina Severe LVH from long-standing hypertension with postoperative fluid retention and renal insufficiency Postoperative pericarditis-epicarditis with diffuse ST segment changes on EKG, CPK-MB 6.1 ng per mL Ambulating in all, chest x-ray with improved aeration, CBG is under good control Objective: Vital signs in last 24 hours: Temp:  [98.1 F (36.7 C)-99.1 F (37.3 C)] 98.1 F (36.7 C) (08/04 1557) Pulse Rate:  [89-105] 92  (08/04 1600) Cardiac Rhythm:  [-] Sinus tachycardia;Normal sinus rhythm (08/04 0800) Resp:  [16-26] 21  (08/04 1700) BP: (106-165)/(53-76) 140/74 mmHg (08/04 1700) SpO2:  [93 %-100 %] 97 % (08/04 1700) Weight:  [221 lb 5.5 oz (100.4 kg)] 221 lb 5.5 oz (100.4 kg) (08/04 0500)  Hemodynamic parameters for last 24 hours:   sinus rhythm  Intake/Output from previous day: 08/03 0701 - 08/04 0700 In: 638 [P.O.:120; I.V.:460; IV Piggyback:58] Out: 1280 [Urine:1280] Intake/Output this shift: Total I/O In: 324 [P.O.:120; I.V.:200; IV Piggyback:4] Out: 870 [Urine:870]  Breath sounds clear incision clean and dry extremities warm mild edema  Lab Results:  Basename 03/31/12 0356 03/30/12 1801  WBC 7.4 9.4  HGB 8.7* 9.3*  HCT 26.2* 28.0*  PLT 106* 134*   BMET:  Basename 03/31/12 0356 03/30/12 1801  NA 136 137  K 4.0 4.0  CL 103 104  CO2 23 22  GLUCOSE 98 130*  BUN 25* 24*  CREATININE 1.83* 1.88*  CALCIUM 11.2* 11.2*    PT/INR: No results found for this basename: LABPROT,INR in the last 72 hours ABG    Component Value Date/Time   PHART 7.337* 03/29/2012 0223   HCO3 20.0 03/29/2012 0223   TCO2 20 03/29/2012 1713   ACIDBASEDEF 5.0* 03/29/2012 0223   O2SAT 98.0 03/29/2012 0223   CBG (last 3)   Basename 03/31/12 1555 03/31/12 1208 03/31/12 0729  GLUCAP 114* 100* 82    Assessment/Plan: S/P Procedure(s) (LRB): CORONARY ARTERY BYPASS GRAFTING  (CABG) (N/A) Probable transfer to step down tomorrow continue current meds and IV Bumex for volume overload   LOS: 9 days    VAN TRIGT III,PETER 03/31/2012

## 2012-04-01 ENCOUNTER — Inpatient Hospital Stay (HOSPITAL_COMMUNITY): Payer: Medicare Other

## 2012-04-01 LAB — BASIC METABOLIC PANEL
BUN: 27 mg/dL — ABNORMAL HIGH (ref 6–23)
CO2: 20 mEq/L (ref 19–32)
Calcium: 10.8 mg/dL — ABNORMAL HIGH (ref 8.4–10.5)
Chloride: 103 mEq/L (ref 96–112)
Creatinine, Ser: 1.58 mg/dL — ABNORMAL HIGH (ref 0.50–1.10)
GFR calc Af Amer: 37 mL/min — ABNORMAL LOW (ref >90)
GFR calc non Af Amer: 32 mL/min — ABNORMAL LOW (ref >90)
Glucose, Bld: 102 mg/dL — ABNORMAL HIGH (ref 70–99)
Potassium: 4 mEq/L (ref 3.5–5.1)
Sodium: 134 mEq/L — ABNORMAL LOW (ref 135–145)

## 2012-04-01 LAB — CARDIAC PANEL(CRET KIN+CKTOT+MB+TROPI)
CK, MB: 2 ng/mL (ref 0.3–4.0)
CK, MB: 2.1 ng/mL (ref 0.3–4.0)
CK, MB: 2.2 ng/mL (ref 0.3–4.0)
Relative Index: INVALID (ref 0.0–2.5)
Relative Index: INVALID (ref 0.0–2.5)
Relative Index: INVALID (ref 0.0–2.5)
Total CK: 76 U/L (ref 7–177)
Total CK: 74 U/L (ref 7–177)
Total CK: 70 U/L (ref 7–177)
Troponin I: 0.77 ng/mL (ref <0.30)
Troponin I: 0.51 ng/mL (ref <0.30)
Troponin I: 0.5 ng/mL (ref <0.30)

## 2012-04-01 LAB — CBC
HCT: 26.8 % — ABNORMAL LOW (ref 36.0–46.0)
Hemoglobin: 8.9 g/dL — ABNORMAL LOW (ref 12.0–15.0)
MCH: 28.2 pg (ref 26.0–34.0)
MCHC: 33.2 g/dL (ref 30.0–36.0)
MCV: 84.8 fL (ref 78.0–100.0)
Platelets: 128 10*3/uL — ABNORMAL LOW (ref 150–400)
RBC: 3.16 MIL/uL — ABNORMAL LOW (ref 3.87–5.11)
RDW: 14.7 % (ref 11.5–15.5)
WBC: 7.4 10*3/uL (ref 4.0–10.5)

## 2012-04-01 LAB — GLUCOSE, CAPILLARY
Glucose-Capillary: 67 mg/dL — ABNORMAL LOW (ref 70–99)
Glucose-Capillary: 73 mg/dL (ref 70–99)
Glucose-Capillary: 87 mg/dL (ref 70–99)

## 2012-04-01 MED ORDER — METOPROLOL TARTRATE 25 MG/10 ML ORAL SUSPENSION
12.5000 mg | Freq: Two times a day (BID) | ORAL | Status: DC
  Filled 2012-04-01 (×8): qty 5

## 2012-04-01 MED ORDER — METOPROLOL TARTRATE 25 MG PO TABS
25.0000 mg | ORAL_TABLET | Freq: Two times a day (BID) | ORAL | Status: DC
  Administered 2012-04-01 – 2012-04-03 (×6): 25 mg via ORAL
  Filled 2012-04-01 (×8): qty 1

## 2012-04-01 NOTE — Progress Notes (Signed)
4 Days Post-Op Procedure(s) (LRB): CORONARY ARTERY BYPASS GRAFTING (CABG) (N/A) Subjective: Status post CABG for subendocardial MI with unstable angina Severe LVH with postoperative fluid retention and rising creatinine now improved Normal sinus rhythm with diffuse ST segment mild changes consistent with epicardial is-pericarditis with minimal cardiac enzymes associated Fluid overload improved with improved urine output ready for transfer to step down Objective: Vital signs in last 24 hours: Temp:  [98 F (36.7 C)-98.4 F (36.9 C)] 98.2 F (36.8 C) (08/05 0819) Pulse Rate:  [87-99] 94  (08/05 0700) Cardiac Rhythm:  [-] Normal sinus rhythm (08/05 0800) Resp:  [16-26] 22  (08/05 0700) BP: (104-165)/(53-76) 153/68 mmHg (08/05 0700) SpO2:  [93 %-100 %] 98 % (08/05 0700) Weight:  [218 lb 14.7 oz (99.3 kg)] 218 lb 14.7 oz (99.3 kg) (08/05 0432)  Hemodynamic parameters for last 24 hours:    Intake/Output from previous day: 08/04 0701 - 08/05 0700 In: 1068 [P.O.:600; I.V.:460; IV Piggyback:8] Out: [id number] Intake/Output this shift:      Lab Results:  Basename 04/01/12 0500 03/31/12 0356  WBC 7.4 7.4  HGB 8.9* 8.7*  HCT 26.8* 26.2*  PLT 128* 106*   BMET:  Basename 04/01/12 0500 03/31/12 0356  NA 134* 136  K 4.0 4.0  CL 103 103  CO2 20 23  GLUCOSE 102* 98  BUN 27* 25*  CREATININE 1.58* 1.83*  CALCIUM 10.8* 11.2*    PT/INR: No results found for this basename: LABPROT,INR in the last 72 hours ABG    Component Value Date/Time   PHART 7.337* 03/29/2012 0223   HCO3 20.0 03/29/2012 0223   TCO2 20 03/29/2012 1713   ACIDBASEDEF 5.0* 03/29/2012 0223   O2SAT 98.0 03/29/2012 0223   CBG (last 3)   Basename 04/01/12 0412 04/01/12 0352 03/31/12 2322  GLUCAP 73 67* 85    Assessment/Plan: S/P Procedure(s) (LRB): CORONARY ARTERY BYPASS GRAFTING (CABG) (N/A) Plan for transfer to step-down: see transfer orders   LOS: 10 days    VAN TRIGT III,Kalysta Kneisley 04/01/2012

## 2012-04-01 NOTE — Plan of Care (Signed)
Problem: Phase III Progression Outcomes Goal: Time patient transferred to PCTU/Telemetry POD Outcome: Completed/Met Date Met:  04/01/12 1440pm-arrived safely to 2016, traveled via wheelchair. VS stable prior and during the transfer. All patient's personal belongings (which included only a black and brown robe, a comb) are at bedside of 2016. Report given to Meryl Crutch, RN. Pt family made aware of the transfer. Abdon Petrosky, Therapist, sports.

## 2012-04-01 NOTE — Significant Event (Signed)
0924-Received a call from Lab, stated that cardiac panel was not received for the 4am time. During report this morning, night RN reported that cardiac panel was already drawn with the AM labs. Lab personnel verbalized back that patient was a nurse draw. RN re-affirm lab personnel that patient is a lab draw and requested to have cardiac panel drawn at this time. Lab personnel stated will send someone to obtain for a cardiac panel. Will monitor. Andry Bogden, Therapist, sports.

## 2012-04-01 NOTE — Progress Notes (Signed)
Hypoglycemic Event  CBG: 67  Treatment: 15 GM carbohydrate snack  Symptoms: None  Follow-up CBG: [id number] CBG Result:73  Possible Reasons for Event: Inadequate meal intake  Comments/MD notified: Per protocol    Billie Ruddy  Remember to initiate Hypoglycemia Order Set & complete

## 2012-04-02 LAB — CARDIAC PANEL(CRET KIN+CKTOT+MB+TROPI)
CK, MB: 1.8 ng/mL (ref 0.3–4.0)
CK, MB: 1.5 ng/mL (ref 0.3–4.0)
CK, MB: 1.5 ng/mL (ref 0.3–4.0)
Relative Index: INVALID (ref 0.0–2.5)
Relative Index: INVALID (ref 0.0–2.5)
Relative Index: INVALID (ref 0.0–2.5)
Total CK: 52 U/L (ref 7–177)
Total CK: 56 U/L (ref 7–177)
Total CK: 43 U/L (ref 7–177)
Troponin I: 0.61 ng/mL (ref <0.30)
Troponin I: 0.44 ng/mL (ref <0.30)
Troponin I: 0.35 ng/mL (ref <0.30)

## 2012-04-02 MED ORDER — BUMETANIDE 2 MG PO TABS
2.0000 mg | ORAL_TABLET | Freq: Two times a day (BID) | ORAL | Status: DC
  Administered 2012-04-03 – 2012-04-04 (×3): 2 mg via ORAL
  Filled 2012-04-02 (×5): qty 1

## 2012-04-02 MED ORDER — BUMETANIDE 0.25 MG/ML IJ SOLN
1.0000 mg | Freq: Two times a day (BID) | INTRAMUSCULAR | Status: DC
  Administered 2012-04-02: 1 mg via INTRAVENOUS
  Filled 2012-04-02 (×3): qty 10

## 2012-04-02 MED ORDER — LISINOPRIL 20 MG PO TABS
20.0000 mg | ORAL_TABLET | Freq: Every day | ORAL | Status: DC
  Administered 2012-04-03 – 2012-04-04 (×2): 20 mg via ORAL
  Filled 2012-04-02 (×2): qty 1

## 2012-04-02 NOTE — Progress Notes (Signed)
CARDIAC REHAB PHASE I   PRE:  Rate/Rhythm: 86SR  BP:  Supine:   Sitting: 158/75  Standing:    SaO2: 97%RA  MODE:  Ambulation: 550 ft   POST:  Rate/Rhythem: 116ST  BP:  Supine:   Sitting: 175/79  Standing:    SaO2: 97%RA 1015-1047 Pt not feeling too well today but willing to walk.  Walked 550 ft on RA with rolling walker and asst x 1. Gait slow and steady. Did not have to take a rest break. To recliner after walk with call bell.  Jeani Sow

## 2012-04-02 NOTE — Progress Notes (Addendum)
5 Days Post-Op Procedure(s) (LRB): CORONARY ARTERY BYPASS GRAFTING (CABG) (N/A)  Subjective:  Ms. Edwin has some fatigue this morning.  She denies chest pain and shortness of breath.  Objective: Vital signs in last 24 hours: Temp:  [98.2 F (36.8 C)-98.7 F (37.1 C)] 98.6 F (37 C) (08/06 0422) Pulse Rate:  [87-105] 88  (08/06 0422) Cardiac Rhythm:  [-] Normal sinus rhythm (08/06 0830) Resp:  [18-28] 18  (08/06 0422) BP: (132-169)/(67-99) 147/92 mmHg (08/06 0422) SpO2:  [95 %-100 %] 99 % (08/06 0422) Weight:  [214 lb 1.6 oz (97.115 kg)] 214 lb 1.6 oz (97.115 kg) (08/06 0422)  Intake/Output from previous day: 08/05 0701 - 08/06 0700 In: 544 [P.O.:540; IV Piggyback:4] Out: 1885 [Urine:1885]  General appearance: alert, cooperative and no distress Heart: regular rate and rhythm Lungs: clear to auscultation bilaterally Abdomen: soft, non-tender; bowel sounds normal; no masses,  no organomegaly Extremities: edema trace Wound: clean and dry  Lab Results:  Basename 04/01/12 0500 03/31/12 0356  WBC 7.4 7.4  HGB 8.9* 8.7*  HCT 26.8* 26.2*  PLT 128* 106*   BMET:  Basename 04/01/12 0500 03/31/12 0356  NA 134* 136  K 4.0 4.0  CL 103 103  CO2 20 23  GLUCOSE 102* 98  BUN 27* 25*  CREATININE 1.58* 1.83*  CALCIUM 10.8* 11.2*    PT/INR: No results found for this basename: LABPROT,INR in the last 72 hours ABG    Component Value Date/Time   PHART 7.337* 03/29/2012 0223   HCO3 20.0 03/29/2012 0223   TCO2 20 03/29/2012 1713   ACIDBASEDEF 5.0* 03/29/2012 0223   O2SAT 98.0 03/29/2012 0223   CBG (last 3)   Basename 04/02/12 0425 04/02/12 0014 04/01/12 2025  GLUCAP 97 134* 80    Assessment/Plan: S/P Procedure(s) (LRB): CORONARY ARTERY BYPASS GRAFTING (CABG) (N/A)  1. CV- NSR on Lopressor 2. Pulm- no acute issues continue IS 3. Renal- creatinine trending down, will repeat BNP in AM 4. Volume Overload- weight is at baseline, on Bumex with good urinary output 5. CBGs- will  discontinue fingersticks and insulin, patient is not a diabetic 6. Dispo- patient slowly progressing, repeat BNP in AM, likely discharge in next few days   LOS: 11 days    BARRETT, ERIN 04/02/2012   patient examined and medical record reviewed,agree with above note. Change Bumex 2 by mouth twice a day and resume blood pressure medicines as needed. VAN TRIGT III,Dawnette Mione 04/02/2012

## 2012-04-03 LAB — CARDIAC PANEL(CRET KIN+CKTOT+MB+TROPI)
CK, MB: 1.4 ng/mL (ref 0.3–4.0)
CK, MB: 1.5 ng/mL (ref 0.3–4.0)
CK, MB: 1.5 ng/mL (ref 0.3–4.0)
Relative Index: INVALID (ref 0.0–2.5)
Relative Index: INVALID (ref 0.0–2.5)
Relative Index: INVALID (ref 0.0–2.5)
Total CK: 36 U/L (ref 7–177)
Total CK: 43 U/L (ref 7–177)
Total CK: 40 U/L (ref 7–177)
Troponin I: 0.36 ng/mL (ref <0.30)
Troponin I: <0.3 ng/mL (ref <0.30)
Troponin I: <0.3 ng/mL (ref <0.30)

## 2012-04-03 LAB — BASIC METABOLIC PANEL
BUN: 23 mg/dL (ref 6–23)
Chloride: 104 mEq/L (ref 96–112)
Creatinine, Ser: 1.38 mg/dL — ABNORMAL HIGH (ref 0.50–1.10)
GFR calc Af Amer: 43 mL/min — ABNORMAL LOW (ref >90)
Glucose, Bld: 96 mg/dL (ref 70–99)

## 2012-04-03 MED ORDER — ASPIRIN 325 MG PO TBEC: 325.0000 mg | DELAYED_RELEASE_TABLET | Freq: Every day | ORAL | Status: AC

## 2012-04-03 MED ORDER — PRAVASTATIN SODIUM 20 MG PO TABS: 20.0000 mg | ORAL_TABLET | Freq: Every day | ORAL | Status: DC

## 2012-04-03 MED ORDER — METOPROLOL TARTRATE 25 MG PO TABS: 25.0000 mg | ORAL_TABLET | Freq: Two times a day (BID) | ORAL | Status: DC

## 2012-04-03 MED ORDER — TIOTROPIUM BROMIDE MONOHYDRATE 18 MCG IN CAPS: 18.0000 ug | ORAL_CAPSULE | Freq: Every day | RESPIRATORY_TRACT | Status: DC

## 2012-04-03 MED ORDER — OXYCODONE HCL 5 MG PO TABS: 5.0000 mg | ORAL_TABLET | ORAL | Status: AC | PRN

## 2012-04-03 MED ORDER — ALUM & MAG HYDROXIDE-SIMETH 200-200-20 MG/5ML PO SUSP
30.0000 mL | ORAL | Status: DC | PRN
  Administered 2012-04-03: 30 mL via ORAL
  Filled 2012-04-03: qty 30

## 2012-04-03 NOTE — Progress Notes (Signed)
CARDIAC REHAB PHASE I   PRE:  Rate/Rhythm: 84SR  BP:  Supine:   Sitting: 153/67  Standing:    SaO2: 96%RA  MODE:  Ambulation: 700 ft   POST:  Rate/Rhythem: 100SR  BP:  Supine:   Sitting: 171/72  Standing:    SaO2: 98%RA 0926-0955 Pt walked 752ft with rolling walker with steady gait. Stopped once to rest. Tolerated well. Would recommend rolling walker for home use. Discussed CRP2 and pt gave permission to refer to [facility]. To recliner after walk with call bell.  Jeani Sow

## 2012-04-03 NOTE — Progress Notes (Signed)
EPW DC'd per MD order and protocol.  No bleeding or ectopy noted, wires intact.  Pt tolerated well and instructed to remain on bedrest x 1 hr.  Frequent vitals initiated, first set WNL.  Will continue to monitor closely. Renee Pain

## 2012-04-03 NOTE — Discharge Summary (Signed)
[address]            ,[place] [address]          [phone]         Discharge Summary  Name: Julia Santana DOB: 20-Mar-1941 71 y.o. MRN: [id number]   Admission Date: 03/22/2012 Discharge Date:     Admitting Diagnosis:  Chest pain   Discharge Diagnosis:   Severe three vessel coronary artery disease  Postoperative pericarditis  Unstable angina  Expected post-op blood loss anemia   Past Medical History  Diagnosis Date  . Renal insufficiency   . Essential hypertension, benign   . Asthma   . Mixed hyperlipidemia   . Hyperparathyroidism   . Seasonal allergies      Procedures: CORONARY ARTERY BYPASS GRAFTING x 3 (Left internal mammary artery to left anterior descending, saphenous vein graft to first obtuse marginal, saphenous vein graft to second obtuse marginal), endoscopic vein harvest right thigh - 03/28/2012    HPI:  The patient is a 71 y.o. female with a three-month history of progressive chest pain, worse with exertion. More recently she has been experiencing symptoms at night that wake her up. She was referred for a Lexiscan Myoview on 7/23 that demonstrated abnormal ST segment depression of 1.5-2 mm in the inferior leads, perfusion imaging demonstrating moderate anteroseptal and apical partially reversible defects concerning for ischemia and possible scar, although breast attenuation was also noted. LVEF was 60% and the TID ratio was borderline at 1.18. She has a known history of chronic kidney disease, asymptomatic, and on an ACE inhibitor at baseline. She was evaluated by Dr. Rozann Lesches and it was felt that she should undergo diagnostic cardiac catheterization. This was performed on 03/22/2012 by Dr. Bing Quarry, and showed severe 3 vessel coronary artery disease, not felt to be amenable to percutaneous intervention.  She had an episode of chest pain on the evening prior to her procedure, therefore, she was admitted  following catheterization for further observation and a cardiac surgery consult.  Hospital Course:  The patient was admitted to [facility] on 03/22/2012. She was seen in consultation by Dr. Tharon Aquas Trigt, and her films were reviewed.  He agreed with the need for surgical revascularization. All risks, benefits and alternatives of surgery were explained in detail, and the patient agreed to proceed. The patient was taken to the operating room and underwent the above procedure.    The postoperative course has been notable for volume overload requiring aggressive diuresis. She also was noted to have diffuse ST segment changes on EKG consistent with pericarditis. She has remained in normal sinus rhythm, and her blood pressures have been controlled.  Her creatinine peaked at 1.83 on post-op day 4, and has been trending back to baseline since then. She is tolerating a regular diet and is ambulating in the halls without difficulty.  We anticipate discharge home in the next 24-48 hours provided no acute changes occur.    Recent vital signs:  Filed Vitals:   04/04/12 0547  BP: 162/78  Pulse: 81  Temp: 98 F (36.7 C)  Resp: 20    Recent laboratory studies:  CBC:No results found for this basename: WBC:2,HGB:2,HCT:2,PLT:2 in the last 72 hours BMET:   [facility] 04/03/12 0405  NA 139  K 3.4*  CL 104  CO2 25  GLUCOSE 96  BUN 23  CREATININE 1.38*  CALCIUM 11.1*  PT/INR: No results found for this basename: LABPROT,INR in the last 72 hours   Discharge Medications:   Medication List  As of 04/04/2012  7:50 AM   STOP taking these medications         metoprolol succinate 25 MG 24 hr tablet      nitroGLYCERIN 0.4 MG SL tablet         TAKE these medications         acetaminophen 500 MG tablet   Commonly known as: TYLENOL   Take 500-1,000 mg by mouth every 6 (six) hours as needed. For pain      aspirin 325 MG EC tablet   Take 1 tablet (325 mg total) by mouth daily.       budesonide-formoterol 160-4.5 MCG/ACT inhaler   Commonly known as: SYMBICORT   Inhale 2 puffs into the lungs daily as needed. For shortness of breath      bumetanide 2 MG tablet   Commonly known as: BUMEX   Take 2 mg by mouth Daily.      lisinopril 20 MG tablet   Commonly known as: PRINIVIL,ZESTRIL   Take 20 mg by mouth Daily.      metoprolol tartrate 25 MG tablet   Commonly known as: LOPRESSOR   Take 2 tablets (50 mg total) by mouth 2 (two) times daily.      oxyCODONE 5 MG immediate release tablet   Commonly known as: Oxy IR/ROXICODONE   Take 1-2 tablets (5-10 mg total) by mouth every 4 (four) hours as needed.      pantoprazole 40 MG tablet   Commonly known as: PROTONIX   Take 40 mg by mouth Daily.      pravastatin 20 MG tablet   Commonly known as: PRAVACHOL   Take 1 tablet (20 mg total) by mouth daily.      tiotropium 18 MCG inhalation capsule   Commonly known as: SPIRIVA   Place 1 capsule (18 mcg total) into inhaler and inhale daily.             Discharge Instructions:  The patient is to refrain from driving, heavy lifting or strenuous activity.  May shower daily and clean incisions with soap and water.  May resume regular diet.   Follow Up:  Discharge Orders    Future Appointments: Provider: Department: Dept Phone: Center:   04/22/2012 2:30 PM [facility] [id number] [id number]     Future Orders Please Complete By Expires   Amb Referral to Cardiac Rehabilitation      Comments:   Referring to [place] phase 2      Follow-up Information    Follow up with VAN Wilber Oliphant, MD on 04/22/2012. (Have a chest x-ray at 1:30, then see Dr. Lucianne Lei Trigt's PA at 2:30)    Contact information:   [address] [place] [phone]       Follow up with Rozann Lesches, MD. Schedule an appointment as soon as possible for a visit in 2 weeks.   Contact information:   [place]  [place], [place] 04/04/2012, 7:50 AM

## 2012-04-03 NOTE — Progress Notes (Signed)
6 Days Post-Op Procedure(s) (LRB): CORONARY ARTERY BYPASS GRAFTING (CABG) (N/A)  Subjective: Ms. Radoncic has no new complaints this morning.  She is ambulating with assistance of a walker.  + BM  She is hopeful discharge soon  Objective: Vital signs in last 24 hours: Temp:  [97.8 F (36.6 C)-99.1 F (37.3 C)] 99.1 F (37.3 C) (08/07 0404) Pulse Rate:  [81-88] 81  (08/07 0722) Cardiac Rhythm:  [-] Normal sinus rhythm (08/07 0404) Resp:  [16-18] 18  (08/07 0722) BP: (144-166)/(82-85) 160/85 mmHg (08/07 0630) SpO2:  [94 %-98 %] 96 % (08/07 0722)  Intake/Output from previous day: 08/06 0701 - 08/07 0700 In: [id number] [P.O.:840; I.V.:3] Out: 752 [Urine:750; Stool:2]  General appearance: alert, cooperative and no distress Heart: regular rate and rhythm Lungs: clear to auscultation bilaterally Abdomen: soft, non-tender; bowel sounds normal; no masses,  no organomegaly Extremities: edema trace Wound: clean and dry  Lab Results:  Basename 04/01/12 0500  WBC 7.4  HGB 8.9*  HCT 26.8*  PLT 128*   BMET:  Basename 04/03/12 0405 04/01/12 0500  NA 139 134*  K 3.4* 4.0  CL 104 103  CO2 25 20  GLUCOSE 96 102*  BUN 23 27*  CREATININE 1.38* 1.58*  CALCIUM 11.1* 10.8*    PT/INR: No results found for this basename: LABPROT,INR in the last 72 hours ABG    Component Value Date/Time   PHART 7.337* 03/29/2012 0223   HCO3 20.0 03/29/2012 0223   TCO2 20 03/29/2012 1713   ACIDBASEDEF 5.0* 03/29/2012 0223   O2SAT 98.0 03/29/2012 0223   CBG (last 3)   Basename 04/02/12 0425 04/02/12 0014 04/01/12 2025  GLUCAP 97 134* 80    Assessment/Plan: S/P Procedure(s) (LRB): CORONARY ARTERY BYPASS GRAFTING (CABG) (N/A)  1. CV- NSR occasional PVCs,  on Lopressor 2. Pulm- no acute issues 3. Renal- creatinine continues to improve 4. Volume overload- good response to Bumex switched to oral regimen yesterday 5. Dispo- patient progressing well, will d/c EPWs this morning.  If no problems arise will plan for  discharge home in AM   LOS: 12 days    BARRETT, ERIN 04/03/2012

## 2012-04-03 NOTE — Progress Notes (Signed)
Pt ambulated 300 feet x1 assist with walker. Pt tolerated well will monitor patient call bell in reach. Gurdeep Keesey, Bettina Gavia RN

## 2012-04-03 NOTE — Progress Notes (Signed)
Pt with lots  of gas pains this pm, pt has ambulated and tried to have a bm, pt with + BM yesterday 8/6 multiple times per patient, abdomen soft, and with bowel sounds in all quadrants.   Dr Roxan Hockey on call and made aware orders received will continue to monitor patient. Renea Schoonmaker, Bettina Gavia RN

## 2012-04-04 LAB — CARDIAC PANEL(CRET KIN+CKTOT+MB+TROPI)
CK, MB: 1.3 ng/mL (ref 0.3–4.0)
Relative Index: INVALID (ref 0.0–2.5)
Total CK: 31 U/L (ref 7–177)
Troponin I: <0.3 ng/mL (ref <0.30)

## 2012-04-04 MED ORDER — METOPROLOL TARTRATE 50 MG PO TABS
50.0000 mg | ORAL_TABLET | Freq: Two times a day (BID) | ORAL | Status: DC
  Administered 2012-04-04: 50 mg via ORAL
  Filled 2012-04-04 (×2): qty 1

## 2012-04-04 MED ORDER — METOPROLOL TARTRATE 25 MG PO TABS: 50.0000 mg | ORAL_TABLET | Freq: Two times a day (BID) | ORAL | Status: DC

## 2012-04-04 NOTE — Progress Notes (Signed)
7 Days Post-Op Procedure(s) (LRB): CORONARY ARTERY BYPASS GRAFTING (CABG) (N/A)  Subjective: Julia Santana has no complaints this morning.  Her gas pains from last night have resolved.  She is asking to be discharged home today.  Objective: Vital signs in last 24 hours: Temp:  [97.9 F (36.6 C)-98.1 F (36.7 C)] 98 F (36.7 C) (08/08 0547) Pulse Rate:  [80-86] 81  (08/08 0547) Cardiac Rhythm:  [-] Normal sinus rhythm (08/07 2100) Resp:  [18-20] 20  (08/08 0547) BP: (150-162)/(78-84) 162/78 mmHg (08/08 0547) SpO2:  [95 %-96 %] 96 % (08/08 0547) Weight:  [208 lb 12.4 oz (94.7 kg)] 208 lb 12.4 oz (94.7 kg) (08/08 0547)  Intake/Output from previous day: 08/07 0701 - 08/08 0700 In: 77 [P.O.:720] Out: 501 [Urine:500; Stool:1]  General appearance: alert, cooperative and no distress Neurologic: intact Heart: regular rate and rhythm Lungs: clear to auscultation bilaterally Abdomen: soft, non-tender; bowel sounds normal; no masses,  no organomegaly Extremities: edema trace Wound: clean and dry  Lab Results: No results found for this basename: WBC:2,HGB:2,HCT:2,PLT:2 in the last 72 hours BMET:  [facility] 04/03/12 0405  NA 139  K 3.4*  CL 104  CO2 25  GLUCOSE 96  BUN 23  CREATININE 1.38*  CALCIUM 11.1*    PT/INR: No results found for this basename: LABPROT,INR in the last 72 hours ABG    Component Value Date/Time   PHART 7.337* 03/29/2012 0223   HCO3 20.0 03/29/2012 0223   TCO2 20 03/29/2012 1713   ACIDBASEDEF 5.0* 03/29/2012 0223   O2SAT 98.0 03/29/2012 0223   CBG (last 3)   Basename 04/02/12 0425 04/02/12 0014 04/01/12 2025  GLUCAP 97 134* 80    Assessment/Plan: S/P Procedure(s) (LRB): CORONARY ARTERY BYPASS GRAFTING (CABG) (N/A)  1. CV-NSR, rate in the 80s, hypertensive will increase Lopressor to 50mg  BID, continue Lisinopril 2. Pulm no acute issues, encouraged continue IS use at discharge 3. Volume Overload- continue home Bumex dose 4. Dispo- patient doing well.  She is  ready to be discharged home today.  We will increase her lopressor dosage to 50mg  BID    LOS: 13 days    Zaydyn Havey 04/04/2012

## 2012-04-04 NOTE — Progress Notes (Signed)
Ed completed. Pt voiced understanding. Anxious for d/c. [place] CES, ACSM

## 2012-04-04 NOTE — Progress Notes (Signed)
DC instructions given to pt at this time RE:  F/u appts, activity, diet, s/s of problems, home meds, and incision care.  Removed sutures, iv, and tele.  No s/s of any acute distress at dc.

## 2012-04-06 NOTE — Discharge Summary (Signed)
patient examined and medical record reviewed,agree with above note. VAN TRIGT III,Kelsy Polack 04/06/2012

## 2012-04-08 ENCOUNTER — Encounter: Payer: Self-pay | Admitting: Cardiothoracic Surgery

## 2012-04-17 ENCOUNTER — Other Ambulatory Visit: Payer: Self-pay | Admitting: Cardiothoracic Surgery

## 2012-04-17 DIAGNOSIS — I2510 Atherosclerotic heart disease of native coronary artery without angina pectoris: Secondary | ICD-10-CM

## 2012-04-22 ENCOUNTER — Ambulatory Visit
Admission: RE | Admit: 2012-04-22 | Discharge: 2012-04-22 | Disposition: A | Discharge status: Home / Self Care | Payer: Medicare Other | Source: Ambulatory Visit | Attending: Cardiothoracic Surgery | Admitting: Cardiothoracic Surgery

## 2012-04-22 ENCOUNTER — Ambulatory Visit (INDEPENDENT_AMBULATORY_CARE_PROVIDER_SITE_OTHER): Payer: Self-pay | Admitting: Surgical

## 2012-04-22 VITALS — BP 195/92 | HR 69 | Resp 20 | Ht 66.0 in | Wt 202.0 lb

## 2012-04-22 DIAGNOSIS — I2510 Atherosclerotic heart disease of native coronary artery without angina pectoris: Secondary | ICD-10-CM

## 2012-04-22 DIAGNOSIS — Z951 Presence of aortocoronary bypass graft: Secondary | ICD-10-CM

## 2012-04-22 MED ORDER — BUMETANIDE 2 MG PO TABS: 2.0000 mg | ORAL_TABLET | Freq: Every day | ORAL | Status: DC

## 2012-04-22 MED ORDER — LISINOPRIL 20 MG PO TABS: 20.0000 mg | ORAL_TABLET | Freq: Every day | ORAL | Status: DC

## 2012-04-22 NOTE — Progress Notes (Signed)
PCP is [facility], [place] Referring Provider is Hillary Bow, MD  Chief Complaint  Patient presents with  . Routine Post Op    3  week f/u from surgery with CXR, S/P CABG x 3 on 03/28/2012    [id number] well without complaints. Denies SOB or chest pain. No constitutional sx. Has not been taking her lisinopril or Bumex as did not have RX at discharge and hadn't informed anyone. She believes the RX was possibly lost by her son.   Past Medical History  Diagnosis Date  . Renal insufficiency   . Essential hypertension, benign   . Asthma   . Mixed hyperlipidemia   . Hyperparathyroidism   . Seasonal allergies     Past Surgical History  Procedure Date  . Parathyroidectomy 12/15/2011    Procedure: PARATHYROIDECTOMY;  Surgeon: Jamesetta So, MD;  Location: [facility];  Service: General;  Laterality: Bilateral;  . Coronary artery bypass graft 03/28/2012    Procedure: CORONARY ARTERY BYPASS GRAFTING (CABG);  Surgeon: Ivin Poot, MD;  Location: [place];  Service: Open Heart Surgery;  Laterality: N/A;  times 3, using Left Internal Mammary and Right Greater Saphenous  Vein Graft harvested endoscopically    Family History  Problem Relation Age of Onset  . Hypertension Mother   . Diabetes type II Mother   . Diabetes type II Sister   . Diabetes type II Brother     Social History History  Substance Use Topics  . Smoking status: Former Smoker -- 1.0 packs/day for 20 years    Types: Cigarettes    Quit date: 08/28/1980  . Smokeless tobacco: Never Used   Comment: quit smoking about 25 years ago  . Alcohol Use: No    Current Outpatient Prescriptions  Medication Sig Dispense Refill  . acetaminophen (TYLENOL) 500 MG tablet Take 500-1,000 mg by mouth every 6 (six) hours as needed. For pain      . budesonide-formoterol (SYMBICORT) 160-4.5 MCG/ACT inhaler Inhale 2 puffs into the lungs daily as needed. For shortness of breath      . lisinopril (PRINIVIL,ZESTRIL) 20 MG tablet Take 1 tablet (20  mg total) by mouth daily.  30 tablet  1  . pantoprazole (PROTONIX) 40 MG tablet Take 40 mg by mouth Daily.      . pravastatin (PRAVACHOL) 20 MG tablet Take 1 tablet (20 mg total) by mouth daily.  30 tablet  1  . tiotropium (SPIRIVA) 18 MCG inhalation capsule Place 1 capsule (18 mcg total) into inhaler and inhale daily.  30 capsule  1  . bumetanide (BUMEX) 2 MG tablet Take 1 tablet (2 mg total) by mouth daily.  30 tablet  0  . metoprolol tartrate (LOPRESSOR) 25 MG tablet Take 2 tablets (50 mg total) by mouth 2 (two) times daily.  120 tablet  1    Allergies  Allergen Reactions  . Losartan Potassium Other (See Comments)    Unsure of what happened when she took this  . Furosemide Cough    Review of Systems otherwise noncontributory   BP 195/92  Pulse 69  Resp 20  Ht 5\' 6"  (1.676 m)  Wt 202 lb (91.627 kg)  BMI 32.60 kg/m2  SpO2 96% Physical Exam General- NAD Lungs- clear Cor- RRR, S1S2, no rub Ext + BLE Incision - healing well   Diagnostic Tests:CXR- with small L effusion   Impression:Doing well but will restart Bumex and Lisinopril as SBP >200 and still with significant lower exr edema.   Plan:F/U 2  weeks Also scheduled to See cardiologist this weds.

## 2012-04-22 NOTE — Patient Instructions (Signed)
Restart BUMEX and LISINOPRIL No driving for 2 additional weeks as BP needs to be improved

## 2012-04-26 ENCOUNTER — Ambulatory Visit (INDEPENDENT_AMBULATORY_CARE_PROVIDER_SITE_OTHER): Payer: Medicare Other | Admitting: Physician Assistant

## 2012-04-26 ENCOUNTER — Encounter: Payer: Self-pay | Admitting: Physician Assistant

## 2012-04-26 VITALS — BP 176/94 | HR 69 | Ht 66.0 in | Wt 198.0 lb

## 2012-04-26 DIAGNOSIS — N289 Disorder of kidney and ureter, unspecified: Secondary | ICD-10-CM

## 2012-04-26 DIAGNOSIS — I2510 Atherosclerotic heart disease of native coronary artery without angina pectoris: Secondary | ICD-10-CM | POA: Insufficient documentation

## 2012-04-26 DIAGNOSIS — R609 Edema, unspecified: Secondary | ICD-10-CM | POA: Insufficient documentation

## 2012-04-26 DIAGNOSIS — I10 Essential (primary) hypertension: Secondary | ICD-10-CM

## 2012-04-26 DIAGNOSIS — E782 Mixed hyperlipidemia: Secondary | ICD-10-CM

## 2012-04-26 NOTE — Patient Instructions (Signed)
   Stop Toprol (Metoprolol Succ.)  Lab:  BMET - today  Labs:  Due in 3 months (around November 30) - fasting lipid and liver panel.  Reminder:  Nothing to eat or drink after 12 midnight prior to labs.  Office will contact with results Follow up in  3 months

## 2012-04-26 NOTE — Assessment & Plan Note (Signed)
Followed by primary M.D. 

## 2012-04-26 NOTE — Assessment & Plan Note (Signed)
Stable status post recent three-vessel CABG. Continue current medication regimen, except discontinue Toprol (patient also on Lopressor). Continue full dose aspirin for one year, then decrease to 81 mg daily, indefinitely. Reassess clinical status in 3 months, with Dr. Domenic Polite.

## 2012-04-26 NOTE — Assessment & Plan Note (Signed)
Will check followup metabolic profile

## 2012-04-26 NOTE — Assessment & Plan Note (Signed)
Recently started on pravastatin. Reassess lipid status in 12 weeks. Target LDL 70 or less, if feasible.

## 2012-04-26 NOTE — Assessment & Plan Note (Signed)
Chronic, on long-standing diuretic therapy, followed by primary M.D.

## 2012-04-26 NOTE — Progress Notes (Signed)
Primary Cardiologist: [name], MD   HPI: Post hospital followup from [facility], status post recent three-vessel CABG, by Dr. Prescott Gum. This was following elective cardiac catheterization, per Dr. Domenic Polite, for evaluation of recent abnormal stress Myoview, with no known history of CAD. She was found to have severe three-vessel CAD (LVG deferred, secondary to renal sufficiency). 2-D echo: EF 0000000, grade 1 diastolic dysfunction.  Patient underwent subsequent CABG on August 1, with grafting of L.-LAD, S.-OM 1, and S.-OM 2. Postop course benign, with no documented dysrhythmia. Peak creatinine postop 1.8. Patient has not had followup labs.  Patient seen earlier this week for postop followup, and was cleared from their standpoint. She is ambulating as instructed, reporting no exertional CP. She is complaining of some dizziness, but no syncope or falls.  12-lead EKG today indicates NSR at 71 bpm; nonspecific ST changes  Allergies  Allergen Reactions  . Losartan Potassium Other (See Comments)    Unsure of what happened when she took this  . Furosemide Cough    Current Outpatient Prescriptions  Medication Sig Dispense Refill  . acetaminophen (TYLENOL) 500 MG tablet Take 500-1,000 mg by mouth every 6 (six) hours as needed. For pain      . aspirin 325 MG tablet Take 325 mg by mouth daily.      . bumetanide (BUMEX) 2 MG tablet Take 1 tablet (2 mg total) by mouth daily.  30 tablet  0  . lisinopril (PRINIVIL,ZESTRIL) 20 MG tablet Take 1 tablet (20 mg total) by mouth daily.  30 tablet  1  . metoprolol tartrate (LOPRESSOR) 25 MG tablet Take 2 tablets (50 mg total) by mouth 2 (two) times daily.  120 tablet  1  . pravastatin (PRAVACHOL) 20 MG tablet Take 1 tablet (20 mg total) by mouth daily.  30 tablet  1  . tiotropium (SPIRIVA) 18 MCG inhalation capsule Place 1 capsule (18 mcg total) into inhaler and inhale daily.  30 capsule  1    Past Medical History  Diagnosis Date  . Renal insufficiency   .  Essential hypertension, benign   . Asthma   . Mixed hyperlipidemia   . Hyperparathyroidism   . Seasonal allergies     Past Surgical History  Procedure Date  . Parathyroidectomy 12/15/2011    Procedure: PARATHYROIDECTOMY;  Surgeon: Jamesetta So, MD;  Location: [facility];  Service: General;  Laterality: Bilateral;  . Coronary artery bypass graft 03/28/2012    Procedure: CORONARY ARTERY BYPASS GRAFTING (CABG);  Surgeon: Ivin Poot, MD;  Location: [place];  Service: Open Heart Surgery;  Laterality: N/A;  times 3, using Left Internal Mammary and Right Greater Saphenous  Vein Graft harvested endoscopically    History   Social History  . Marital Status: Married    Spouse Name: SHERMAN    Number of Children: N/A  . Years of Education: N/A   Occupational History  . RETIRED    Social History Main Topics  . Smoking status: Former Smoker -- 1.0 packs/day for 20 years    Types: Cigarettes    Quit date: 08/28/1980  . Smokeless tobacco: Never Used   Comment: quit smoking about 25 years ago  . Alcohol Use: No  . Drug Use: No  . Sexually Active: Not on file   Other Topics Concern  . Not on file   Social History Narrative  . No narrative on file    Family History  Problem Relation Age of Onset  . Hypertension Mother   .  Diabetes type II Mother   . Diabetes type II Sister   . Diabetes type II Brother     ROS: no nausea, vomiting; no fever, chills; no melena, hematochezia; no claudication  PHYSICAL EXAM: BP 176/94  Pulse 69  Ht 5\' 6"  (1.676 m)  Wt 198 lb (89.812 kg)  BMI 31.96 kg/m2 GENERAL: 71 year old female, obese; NAD HEENT: NCAT, PERRLA, EOMI; sclera clear; no xanthelasma NECK: palpable bilateral carotid pulses, no bruits; unable to assess JVD; no TM LUNGS: Diminished, crackles or wheezes CARDIAC: RRR (S1, S2); no significant murmurs; no rubs or gallops ABDOMEN: Protuberant EXTREMETIES: 2-3+, bilateral nonpitting peripheral edema SKIN: warm/dry; no obvious  rash/lesions MUSCULOSKELETAL: no joint deformity NEURO: no focal deficit; NL affect   EKG: Please refer to electronic medical record   ASSESSMENT & PLAN:  CAD (coronary artery disease) Stable status post recent three-vessel CABG. Continue current medication regimen, except discontinue Toprol (patient also on Lopressor). Continue full dose aspirin for one year, then decrease to 81 mg daily, indefinitely. Reassess clinical status in 3 months, with Dr. Domenic Polite.  Renal insufficiency Will check followup metabolic profile  Mixed hyperlipidemia Recently started on pravastatin. Reassess lipid status in 12 weeks. Target LDL 70 or less, if feasible.  HYPERTENSION Followed by primary M.D.  Edema Chronic, on long-standing diuretic therapy, followed by primary M.D.    Gene Dallis Darden, PAC

## 2012-05-02 ENCOUNTER — Telehealth: Payer: Self-pay | Admitting: *Deleted

## 2012-05-02 NOTE — Telephone Encounter (Signed)
Patient informed. 

## 2012-05-02 NOTE — Telephone Encounter (Signed)
Message copied by Merlene Laughter on Thu May 02, 2012  4:18 PM ------      Message from: Donney Dice      Created: Wed May 01, 2012 12:27 PM       Stable renal fxn

## 2012-05-07 ENCOUNTER — Other Ambulatory Visit: Payer: Self-pay | Admitting: Cardiothoracic Surgery

## 2012-05-07 DIAGNOSIS — I2510 Atherosclerotic heart disease of native coronary artery without angina pectoris: Secondary | ICD-10-CM

## 2012-05-08 ENCOUNTER — Encounter: Payer: Self-pay | Admitting: Cardiothoracic Surgery

## 2012-05-08 ENCOUNTER — Ambulatory Visit (INDEPENDENT_AMBULATORY_CARE_PROVIDER_SITE_OTHER): Payer: Self-pay | Admitting: Cardiothoracic Surgery

## 2012-05-08 ENCOUNTER — Ambulatory Visit
Admission: RE | Admit: 2012-05-08 | Discharge: 2012-05-08 | Disposition: A | Discharge status: Home / Self Care | Payer: Medicare Other | Source: Ambulatory Visit | Attending: Cardiothoracic Surgery | Admitting: Cardiothoracic Surgery

## 2012-05-08 VITALS — BP 160/88 | HR 78 | Resp 20 | Ht 66.0 in | Wt 194.0 lb

## 2012-05-08 DIAGNOSIS — I10 Essential (primary) hypertension: Secondary | ICD-10-CM

## 2012-05-08 DIAGNOSIS — I2510 Atherosclerotic heart disease of native coronary artery without angina pectoris: Secondary | ICD-10-CM

## 2012-05-08 DIAGNOSIS — Z951 Presence of aortocoronary bypass graft: Secondary | ICD-10-CM

## 2012-05-08 NOTE — Progress Notes (Signed)
PCP is [facility], [place] Referring Provider is Hillary Bow, MD  Chief Complaint  Patient presents with  . Routine Post Op    2 week f/u with CXR, S/P CABG x 3 on 03/28/2012    HPI: Status post CABG August 1 for unstable angina three-vessel disease Doing well at home no recurrent angina incisions healing well no shortness of breath fluid status improved after resuming her Bumex 2 mg daily Patient states she is ready to go hiking  Past Medical History  Diagnosis Date  . Renal insufficiency   . Essential hypertension, benign   . Asthma   . Mixed hyperlipidemia   . Hyperparathyroidism   . Seasonal allergies     Past Surgical History  Procedure Date  . Parathyroidectomy 12/15/2011    Procedure: PARATHYROIDECTOMY;  Surgeon: Jamesetta So, MD;  Location: [facility];  Service: General;  Laterality: Bilateral;  . Coronary artery bypass graft 03/28/2012    Procedure: CORONARY ARTERY BYPASS GRAFTING (CABG);  Surgeon: Ivin Poot, MD;  Location: [place];  Service: Open Heart Surgery;  Laterality: N/A;  times 3, using Left Internal Mammary and Right Greater Saphenous  Vein Graft harvested endoscopically    Family History  Problem Relation Age of Onset  . Hypertension Mother   . Diabetes type II Mother   . Diabetes type II Sister   . Diabetes type II Brother     Social History History  Substance Use Topics  . Smoking status: Former Smoker -- 1.0 packs/day for 20 years    Types: Cigarettes    Quit date: 08/28/1980  . Smokeless tobacco: Never Used   Comment: quit smoking about 25 years ago  . Alcohol Use: No    Current Outpatient Prescriptions  Medication Sig Dispense Refill  . acetaminophen (TYLENOL) 500 MG tablet Take 500-1,000 mg by mouth every 6 (six) hours as needed. For pain      . aspirin 325 MG tablet Take 325 mg by mouth daily.      . bumetanide (BUMEX) 2 MG tablet Take 1 tablet (2 mg total) by mouth daily.  30 tablet  0  . lisinopril (PRINIVIL,ZESTRIL) 20 MG tablet  Take 1 tablet (20 mg total) by mouth daily.  30 tablet  1  . metoprolol tartrate (LOPRESSOR) 25 MG tablet Take 2 tablets (50 mg total) by mouth 2 (two) times daily.  120 tablet  1  . pravastatin (PRAVACHOL) 20 MG tablet Take 1 tablet (20 mg total) by mouth daily.  30 tablet  1  . tiotropium (SPIRIVA) 18 MCG inhalation capsule Place 1 capsule (18 mcg total) into inhaler and inhale daily.  30 capsule  1    Allergies  Allergen Reactions  . Losartan Potassium Other (See Comments)    Unsure of what happened when she took this  . Furosemide Cough    Review of Systems no fever no cough no swelling no shortness of breath  BP 160/88  Pulse 78  Resp 20  Ht 5\' 6"  (1.676 m)  Wt 194 lb (87.998 kg)  BMI 31.31 kg/m2  SpO2 97% Physical Exam General alert and comfortable Lungs clear, chest incision healed Cardiac regular rhythm without gallop Abdomen soft Extremities no edema healed saphenous vein harvest incision  Diagnostic Tests: Chest x-ray clear left pleural effusion resolved  Impression: Stable 6 weeks postop CABG x3. The patient is ready to start outpatient rehabilitation. She may drive. She should continue her aspirin and Pravachol indefinitely. Lifting limits less than 20 pounds until  mid-November 1.  Plan: Return to Dr. Domenic Polite for cardiac followup. Return ears needed.

## 2012-05-08 NOTE — Patient Instructions (Signed)
You may drive U. may now lift more than 20 pounds until November 1 U. should continue your aspirin and your Pravachol indefinitely to help keep the bypass grafts open and your heart

## 2012-07-09 ENCOUNTER — Other Ambulatory Visit: Payer: Self-pay | Admitting: Physician Assistant

## 2012-07-22 ENCOUNTER — Ambulatory Visit: Payer: Medicare Other | Admitting: Cardiology

## 2012-08-13 ENCOUNTER — Other Ambulatory Visit: Payer: Self-pay | Admitting: Surgical

## 2012-09-03 ENCOUNTER — Encounter: Payer: Self-pay | Admitting: Cardiology

## 2012-09-06 ENCOUNTER — Encounter: Payer: Self-pay | Admitting: Cardiology

## 2012-09-06 ENCOUNTER — Ambulatory Visit (INDEPENDENT_AMBULATORY_CARE_PROVIDER_SITE_OTHER): Payer: Medicare Other | Admitting: Cardiology

## 2012-09-06 VITALS — BP 185/79 | HR 89 | Ht 68.0 in | Wt 188.0 lb

## 2012-09-06 DIAGNOSIS — I2510 Atherosclerotic heart disease of native coronary artery without angina pectoris: Secondary | ICD-10-CM

## 2012-09-06 DIAGNOSIS — I10 Essential (primary) hypertension: Secondary | ICD-10-CM

## 2012-09-06 DIAGNOSIS — E782 Mixed hyperlipidemia: Secondary | ICD-10-CM

## 2012-09-06 MED ORDER — METOPROLOL TARTRATE 25 MG PO TABS: 25.0000 mg | ORAL_TABLET | Freq: Two times a day (BID) | ORAL | Status: DC

## 2012-09-06 MED ORDER — PRAVASTATIN SODIUM 40 MG PO TABS: 40.0000 mg | ORAL_TABLET | Freq: Every evening | ORAL | Status: DC

## 2012-09-06 NOTE — Assessment & Plan Note (Signed)
Increase Pravachol to 40 mg daily for now. Can followup lab work around time of her next visit.

## 2012-09-06 NOTE — Patient Instructions (Addendum)
Your physician recommends that you schedule a follow-up appointment in: 6 months. You will receive a reminder letter in the mail in about 4 months reminding you to call and schedule your appointment. If you don't receive this letter, please contact our office. Your physician has recommended you make the following change in your medication: Increase your pravastatin to 40 mg daily. Please take (2) of your 20 mg tablets until they are finished. Start metoprolol tartrate 25 mg twice daily. Your new prescriptions has been sent to your pharmacy. All other medications will remain the same.

## 2012-09-06 NOTE — Progress Notes (Signed)
Clinical Summary Julia Santana is a 72 y.o.female presenting for followup. She was last seen in our office by Mr. Serpe back in August 2013. She states that she has been doing better, getting stronger after her CABG last year. Denies any angina symptoms or unusual breathlessness. States that she has been compliant with her medications, although did run out of Lopressor and this was never resumed.  Recent lab work reviewed by Mr. Serpe showed normal AST and ALT, triglycerides 116, cholesterol 189, HDL 43, and LDL 123. She states that she has been taking her Pravachol regularly.   Allergies  Allergen Reactions  . Losartan Potassium Other (See Comments)    Unsure of what happened when she took this  . Furosemide Cough    Current Outpatient Prescriptions  Medication Sig Dispense Refill  . acetaminophen (TYLENOL) 500 MG tablet Take 500-1,000 mg by mouth every 6 (six) hours as needed. For pain      . aspirin 81 MG tablet Take 81 mg by mouth daily.      . bumetanide (BUMEX) 2 MG tablet Take 1 tablet (2 mg total) by mouth daily.  30 tablet  0  . lisinopril (PRINIVIL,ZESTRIL) 20 MG tablet Take 1 tablet (20 mg total) by mouth daily.  30 tablet  1  . tiotropium (SPIRIVA) 18 MCG inhalation capsule Place 1 capsule (18 mcg total) into inhaler and inhale daily.  30 capsule  1  . metoprolol tartrate (LOPRESSOR) 25 MG tablet Take 1 tablet (25 mg total) by mouth 2 (two) times daily.  60 tablet  6  . pravastatin (PRAVACHOL) 40 MG tablet Take 1 tablet (40 mg total) by mouth every evening.  30 tablet  6    Past Medical History  Diagnosis Date  . CKD (chronic kidney disease) stage 3, GFR 30-59 ml/min   . Essential hypertension, benign   . Asthma   . Mixed hyperlipidemia   . Hyperparathyroidism   . Seasonal allergies   . Coronary atherosclerosis of native coronary artery     Multivessel status post CABG 8/13    Past Surgical History  Procedure Date  . Parathyroidectomy 12/15/2011    Procedure:  PARATHYROIDECTOMY;  Surgeon: Jamesetta So, MD;  Location: [facility];  Service: General;  Laterality: Bilateral;  . Coronary artery bypass graft 03/28/2012    Procedure: CORONARY ARTERY BYPASS GRAFTING (CABG);  Surgeon: Ivin Poot, MD;  Location: [place];  Service: Open Heart Surgery;  Laterality: N/A;  times 3, using Left Internal Mammary and Right Greater Saphenous  Vein Graft harvested endoscopically    Social History Julia Santana reports that she quit smoking about 32 years ago. Her smoking use included Cigarettes. She has a 20 pack-year smoking history. She has never used smokeless tobacco. Julia Santana reports that she does not drink alcohol.  Review of Systems Mild postsurgical paresthesia in her sternal area, gradually improving. Negative except as outlined above.  Physical Examination Filed Vitals:   09/06/12 1342  BP: 185/79  Pulse: 89   Filed Weights   09/06/12 1342  Weight: 188 lb (85.276 kg)   No acute distress. HEENT: Conjunctiva and lids normal, oropharynx clear. Neck: Supple, no elevated JVP or carotid bruits, no thyromegaly. Lungs: Clear to auscultation, nonlabored breathing at rest. Cardiac: Regular rate and rhythm, no S3 or significant systolic murmur, no pericardial rub. Thorax: Well healed sternal incision. Abdomen: Soft, nontender, bowel sounds present. Extremities: No pitting edema, distal pulses 2+. Skin: Warm and dry. Musculoskeletal: No kyphosis. Neuropsychiatric: Alert  and oriented x3, affect grossly appropriate.   Problem List and Plan   Coronary atherosclerosis of native coronary artery Symptomatically stable, doing well status post CABG last year. Continue diet and exercise, medical therapy.  Essential hypertension, benign Blood pressure elevated today. We are planning to add back Lopressor 25 mg twice daily, titrate up as needed.  Mixed hyperlipidemia Increase Pravachol to 40 mg daily for now. Can followup lab work around time of her next  visit.    Satira Sark, M.D., F.A.C.C.

## 2012-09-06 NOTE — Assessment & Plan Note (Signed)
Blood pressure elevated today. We are planning to add back Lopressor 25 mg twice daily, titrate up as needed.

## 2012-09-06 NOTE — Assessment & Plan Note (Signed)
Symptomatically stable, doing well status post CABG last year. Continue diet and exercise, medical therapy.

## 2012-09-24 ENCOUNTER — Telehealth: Payer: Self-pay | Admitting: *Deleted

## 2012-09-24 MED ORDER — ATORVASTATIN CALCIUM 40 MG PO TABS: 40.0000 mg | ORAL_TABLET | Freq: Every evening | ORAL | Status: DC

## 2012-09-24 NOTE — Telephone Encounter (Signed)
Message copied by Laurine Blazer on Tue Sep 24, 2012 10:17 AM ------      Message from: Aurora Mask C      Created: Wed Sep 04, 2012 10:15 AM       LDL 123. Switch Pravachol to Lipitor 40 daily. FLP/LFT profile in 12 weeks

## 2012-09-24 NOTE — Telephone Encounter (Signed)
Notes Recorded by Laurine Blazer, LPN on [id number] at 624THL AM Patient notified. Will send new rx to [facility]. Will send reminder in mail when time to repeat labs. Patient verbalized understanding. Notes Recorded by Laurine Blazer, LPN on [id number] at [id number] AM Left message to return call.  Notes Recorded by Laurine Blazer, LPN on [id number] at [id number] PM Left message to return call.

## 2012-10-21 ENCOUNTER — Telehealth: Payer: Self-pay | Admitting: *Deleted

## 2012-10-21 NOTE — Telephone Encounter (Signed)
Message left on nurse's voicemail r/e side effects of metoprolol. Nurse returned call and requested her to call back to discuss medication problem.

## 2012-10-22 NOTE — Telephone Encounter (Signed)
Patient complaint of constant headache, dizziness,nose runny like a faucet. No chest pain or shortness of breath. Patient thinks the symptoms are coming from the metoprolol. Patient states that her headache actually started in August 2013. Patient stopped taking the lopresor on this past Saturday and say's that her nose is no longer dripping and the headache has improved. Nurse advised patient that she should see her family doctor for the above named symptoms. Patient states she is seeing Tawni Carnes on next Friday.

## 2012-12-25 ENCOUNTER — Encounter: Payer: Self-pay | Admitting: *Deleted

## 2012-12-25 ENCOUNTER — Telehealth: Payer: Self-pay | Admitting: *Deleted

## 2012-12-25 DIAGNOSIS — Z79899 Other long term (current) drug therapy: Secondary | ICD-10-CM

## 2012-12-25 DIAGNOSIS — E782 Mixed hyperlipidemia: Secondary | ICD-10-CM

## 2012-12-25 NOTE — Telephone Encounter (Signed)
Letter and orders mailed today.

## 2012-12-25 NOTE — Telephone Encounter (Signed)
Message copied by Laurine Blazer on Wed Dec 25, 2012  9:41 AM ------      Message from: Laurine Blazer      Created: Tue Sep 24, 2012 10:22 AM       FLP / LFT - 3 MONTHS ------

## 2013-01-01 ENCOUNTER — Telehealth: Payer: Self-pay | Admitting: *Deleted

## 2013-01-01 NOTE — Telephone Encounter (Signed)
Patient informed. 

## 2013-01-01 NOTE — Telephone Encounter (Signed)
Message copied by Merlene Laughter on Wed Jan 01, 2013  1:50 PM ------      Message from: Donney Dice      Created: Wed Jan 01, 2013 11:59 AM       LDL down to 75 after switching to Lipitor 40. Continue current medication regimen.       ------

## 2013-03-14 ENCOUNTER — Encounter: Payer: Self-pay | Admitting: Cardiology

## 2013-03-14 ENCOUNTER — Encounter: Payer: Medicare Other | Admitting: Cardiology

## 2013-03-14 NOTE — Progress Notes (Signed)
No show  This encounter was created in error - please disregard.

## 2013-09-01 ENCOUNTER — Encounter: Payer: Self-pay | Admitting: Cardiology

## 2013-09-01 ENCOUNTER — Ambulatory Visit (INDEPENDENT_AMBULATORY_CARE_PROVIDER_SITE_OTHER): Payer: Medicare HMO | Admitting: Cardiology

## 2013-09-01 VITALS — BP 155/82 | HR 80 | Ht 68.0 in | Wt 199.0 lb

## 2013-09-01 DIAGNOSIS — I2510 Atherosclerotic heart disease of native coronary artery without angina pectoris: Secondary | ICD-10-CM

## 2013-09-01 DIAGNOSIS — E782 Mixed hyperlipidemia: Secondary | ICD-10-CM

## 2013-09-01 DIAGNOSIS — I10 Essential (primary) hypertension: Secondary | ICD-10-CM

## 2013-09-01 NOTE — Assessment & Plan Note (Signed)
To continue medical therapy and followup with primary care.

## 2013-09-01 NOTE — Progress Notes (Signed)
Clinical Summary Julia Santana is a 73 y.o.female last seen in January 2014. She reports compliance with her medications. She has had continued followup with primary care in the interim, did report some chest pain symptoms back in October 2014. She was referred by Julia Santana for a followup Lexiscan Cardiolite that demonstrated soft tissue attenuation, no active ischemia, LVEF 67%.  Lab work from May 2014 showed normal LFTs, triglycerides 62, cholesterol 131, HDL 44, LDL 75.  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 She has not been exercising, and we discussed this today. We reviewed her medications.   Allergies  Allergen Reactions  . Losartan Potassium Other (See Comments)    Unsure of what happened when she took this  . Furosemide Cough    Current Outpatient Prescriptions  Medication Sig Dispense Refill  . acetaminophen (TYLENOL) 500 MG tablet Take 500-1,000 mg by mouth every 6 (six) hours as needed. For pain      . aspirin 81 MG tablet Take 81 mg by mouth daily.      Marland Kitchen atorvastatin (LIPITOR) 40 MG tablet Take 1 tablet (40 mg total) by mouth every evening.  30 tablet  6  . bumetanide (BUMEX) 2 MG tablet Take 1 tablet (2 mg total) by mouth daily.  30 tablet  0  . lisinopril (PRINIVIL,ZESTRIL) 20 MG tablet Take 1 tablet (20 mg total) by mouth daily.  30 tablet  1  . tiotropium (SPIRIVA) 18 MCG inhalation capsule Place 1 capsule (18 mcg total) into inhaler and inhale daily.  30 capsule  1   No current facility-administered medications for this visit.    Past Medical History  Diagnosis Date  . CKD (chronic kidney disease) stage 3, GFR 30-59 ml/min   . Essential hypertension, benign   . Asthma   . Mixed hyperlipidemia   . Hyperparathyroidism   . Seasonal allergies   . Coronary atherosclerosis of native coronary artery     Multivessel status post CABG 8/13    Past Surgical History  Procedure Laterality Date  . Parathyroidectomy  12/15/2011    Procedure:  PARATHYROIDECTOMY;  Surgeon: Jamesetta So, MD;  Location: [facility];  Service: General;  Laterality: Bilateral;  . Coronary artery bypass graft  03/28/2012    Procedure: CORONARY ARTERY BYPASS GRAFTING (CABG);  Surgeon: Ivin Poot, MD;  Location: [facility];  Service: Open Heart Surgery;  Laterality: N/A;  times 3, using Left Internal Mammary and Right Greater Saphenous  Vein Graft harvested endoscopically    Social History Julia Santana reports that she quit smoking about 33 years ago. Her smoking use included Cigarettes. She has a 20 pack-year smoking history. She has never used smokeless tobacco. Julia Santana reports that she does not drink alcohol.  Review of Systems No palpitations, dizziness, syncope. No orthopnea or PND. No claudication. Otherwise negative.  Physical Examination Filed Vitals:   09/01/13 1511  BP: 155/82  Pulse: 80   Filed Weights   09/01/13 1511  Weight: 199 lb (90.266 kg)    No acute distress.  HEENT: Conjunctiva and lids normal, oropharynx clear.  Neck: Supple, no elevated JVP or carotid bruits, no thyromegaly.  Lungs: Clear to auscultation, nonlabored breathing at rest.  Cardiac: Regular rate and rhythm, no S3 or significant systolic murmur, no pericardial rub.  Thorax: Well healed sternal incision.  Abdomen: Soft, nontender, bowel sounds present.  Extremities: No pitting edema, distal pulses 2+.  Skin: Warm and dry.  Musculoskeletal: No kyphosis.  Neuropsychiatric: Alert and oriented x3, affect grossly appropriate.   Problem List and Plan   Coronary atherosclerosis of native coronary artery Multivessel disease status post CABG in August 2013, clinically stable at this point. Recent followup Lexiscan Cardiolite from October 2014 was negative for ischemia with normal LVEF. I encouraged her to consider a regular walking regimen for exercise, otherwise followup arranged.  Essential hypertension, benign To continue medical therapy and followup with primary  care.  Mixed hyperlipidemia She continues on Lipitor, LDL was 75 in May 2014.    Satira Sark, M.D., F.A.C.C.

## 2013-09-01 NOTE — Assessment & Plan Note (Signed)
She continues on Lipitor, LDL was 75 in May 2014.

## 2013-09-01 NOTE — Assessment & Plan Note (Signed)
Multivessel disease status post CABG in August 2013, clinically stable at this point. Recent followup Lexiscan Cardiolite from October 2014 was negative for ischemia with normal LVEF. I encouraged her to consider a regular walking regimen for exercise, otherwise followup arranged.

## 2013-09-01 NOTE — Patient Instructions (Signed)
Your physician recommends that you schedule a follow-up appointment in: 8 months. You will receive a reminder letter in the mail in about 6 months reminding you to call and schedule your appointment. If you don't receive this letter, please contact our office. Your physician recommends that you continue on your current medications as directed. Please refer to the Current Medication list given to you today.

## 2013-10-09 ENCOUNTER — Other Ambulatory Visit: Payer: Self-pay | Admitting: Physician Assistant

## 2013-10-28 IMAGING — CR DG CHEST 1V PORT
1 series · 1 of 1 positions shown · non-contrast
Comparison: Preoperative radiographs 03/24/2012

CLINICAL DATA: The postop status post CABG

PORTABLE CHEST - 1 VIEW

[AP]
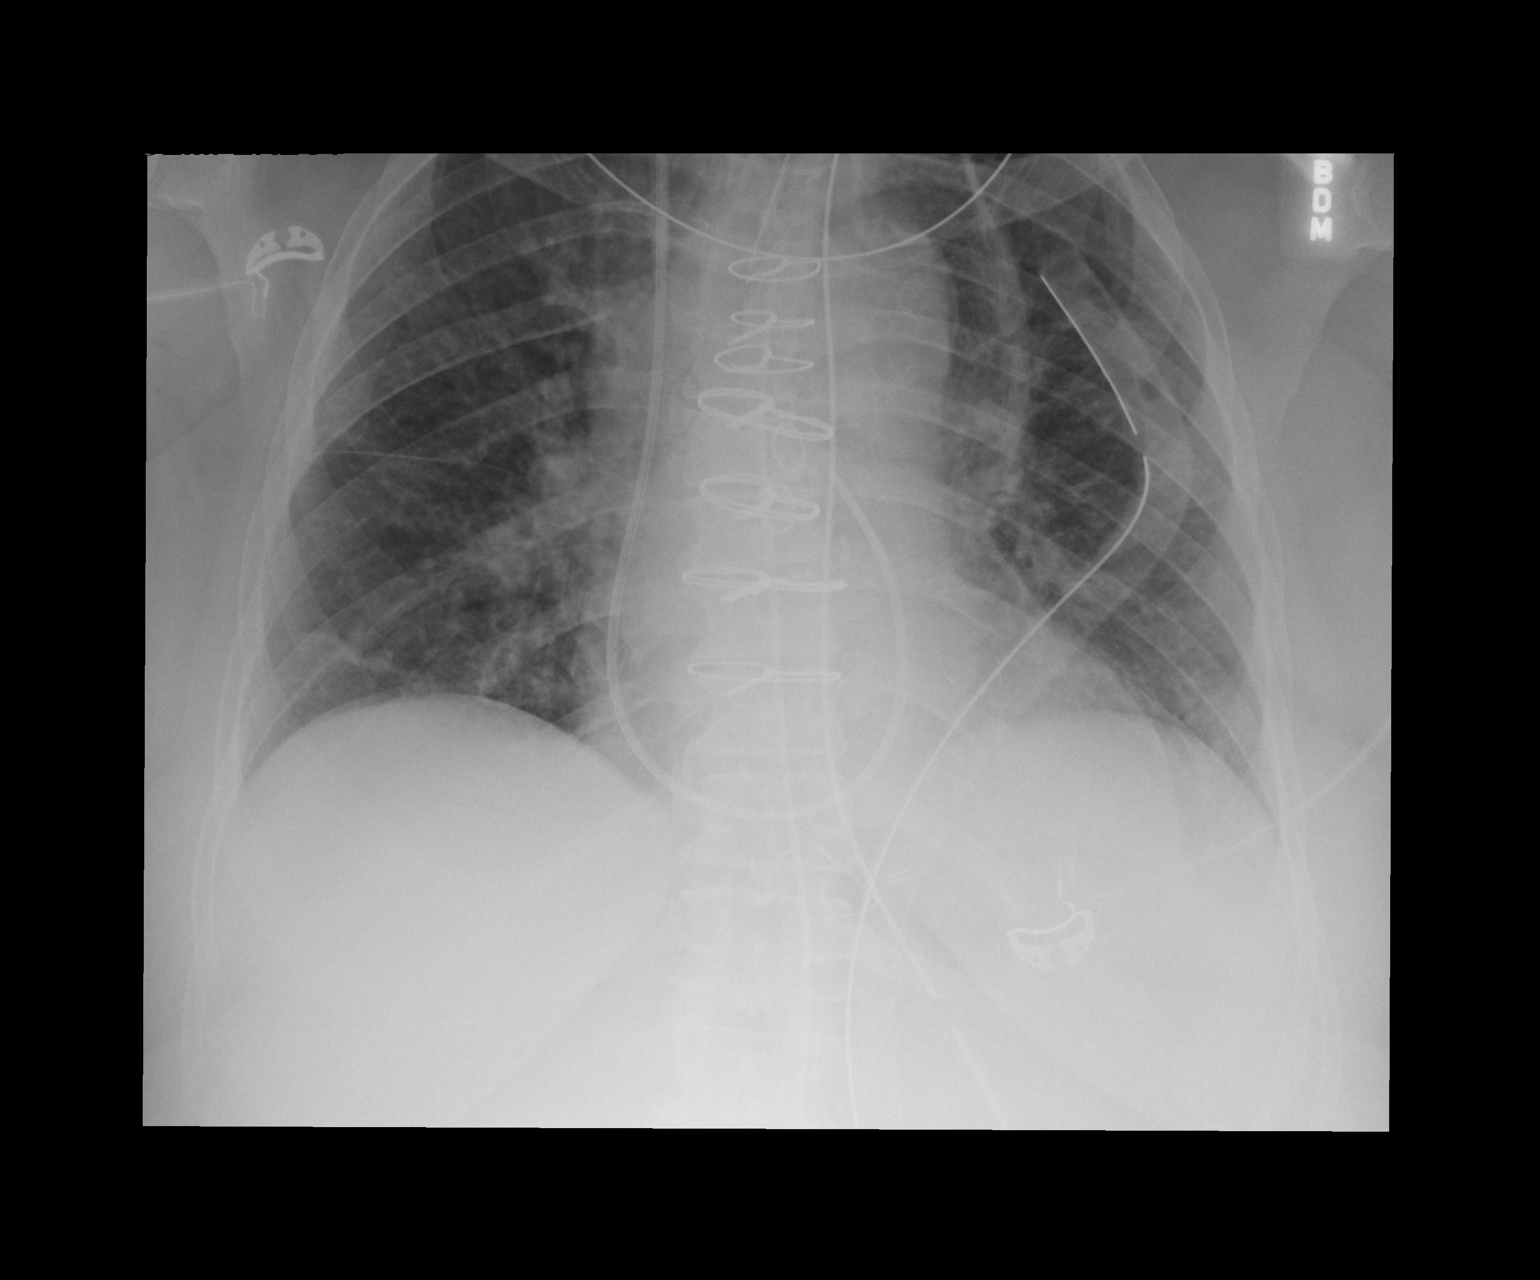

[1 of 1 positions shown; findings below may reference images not displayed]

FINDINGS: Postsurgical changes of recent median sternotomy with
multivessel CABG including [REDACTED] bypass. The patient is intubated to
the ET tube is in good position 4 cm above the carina.  Right IJ
vascular sheath conveys a Swan-Ganz catheter into the heart.  The
tip of the Swan-Ganz catheter overlies the main pulmonary artery.
A nasogastric tube is present, the tip projects over the gastric
bubble.  There are three subxiphoid approach thoracic drains.  One
overlies the spine, one overlies the left paraspinal soft tissues
and is directed toward the apex, the tip lies off the field of view
high and apex.  The third is located posteriorly in the left
hemithorax.

Low inspiratory volumes and bibasilar atelectasis.  Likely small
medial left pneumothorax.  No pleural effusions.  Mild pulmonary
vascular congestion without overt edema.
IMPRESSION: 1.  Satisfactory position of support apparatus as above.  Of note,
the tip of the left paraspinal thoracic drains lies off the field
of view, likely high within the left apex.
2.  Small left medial pneumothorax
3.  Low inspiratory volumes, bibasilar atelectasis and mild
pulmonary vascular congestion without overt edema.

## 2013-10-29 IMAGING — CR DG CHEST 1V PORT
1 series · 1 of 1 positions shown · non-contrast
Comparison: 03/28/2012

CLINICAL DATA: Follow-up CABG.

PORTABLE CHEST - 1 VIEW

[AP]
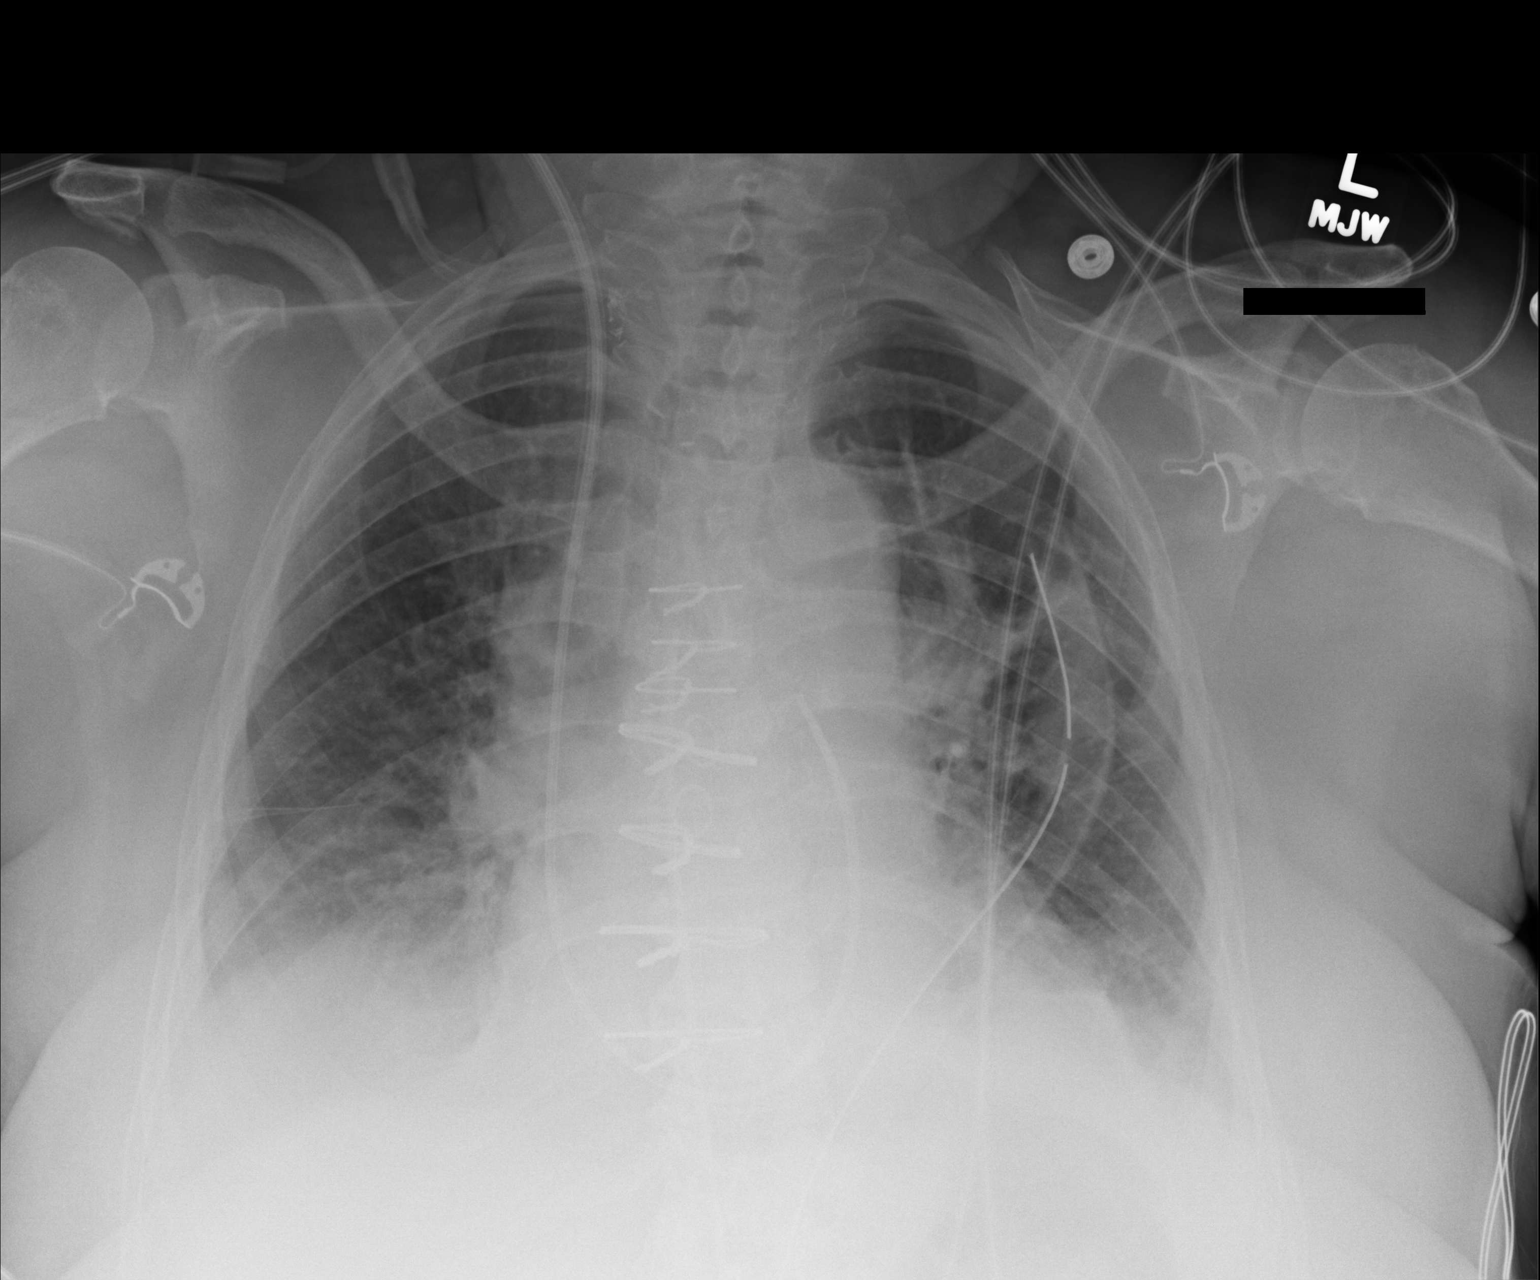

[1 of 1 positions shown; findings below may reference images not displayed]

FINDINGS: Endotracheal tube and nasogastric tube have been removed.
Left chest tube remains in place.  Swan-Ganz catheter has its tip
in the main pulmonary artery.  There is no pneumothorax.  There is
venous hypertension without frank edema.  There is basilar volume
loss following extubation.
IMPRESSION: Basilar volume loss following extubation.

## 2013-10-30 IMAGING — CR DG CHEST 1V PORT
1 series · 1 of 1 positions shown · non-contrast
Comparison: 03/29/2012.

CLINICAL DATA: Postoperative radiograph.  Cardiac surgery.

PORTABLE CHEST - 1 VIEW

[view not recorded]
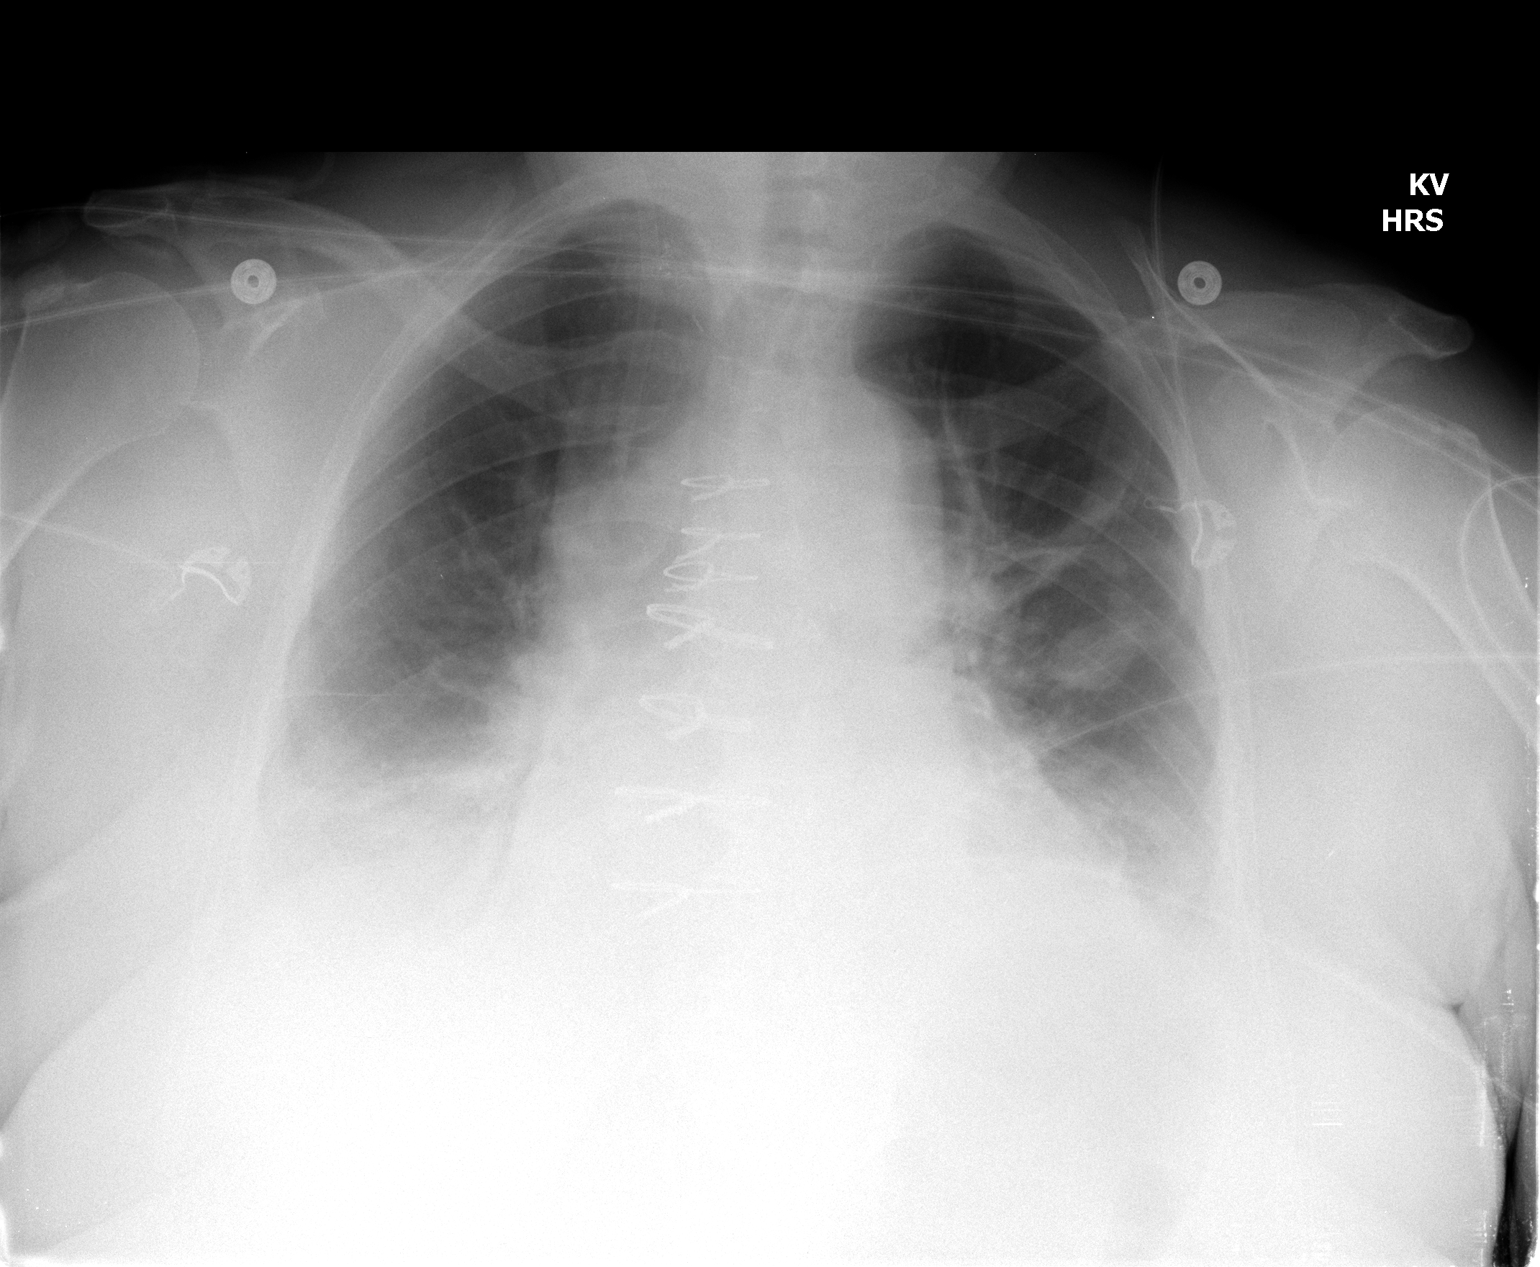

[1 of 1 positions shown; findings below may reference images not displayed]

FINDINGS: The exam is under penetrated.  Interval removal of Swan-
Ganz catheter and mediastinal drains.  Right IJ vascular sheath
persists.  Bibasilar atelectasis and pleural effusions remain
present.  Pleural reaction along the left chest tube tract is
noted.  No pneumothorax.  Cardiomegaly and median sternotomy.  Left
thoracostomy tube has been removed.
IMPRESSION: 1.  Interval removal of support apparatus aside from right IJ
vascular sheath.
2.  Bilateral basilar atelectasis and effusions.

## 2014-06-11 ENCOUNTER — Encounter: Payer: Self-pay | Admitting: Cardiology

## 2014-06-11 ENCOUNTER — Ambulatory Visit (INDEPENDENT_AMBULATORY_CARE_PROVIDER_SITE_OTHER): Payer: Commercial Managed Care - HMO | Admitting: Cardiology

## 2014-06-11 VITALS — BP 146/85 | HR 73 | Ht 68.0 in | Wt 202.5 lb

## 2014-06-11 DIAGNOSIS — I10 Essential (primary) hypertension: Secondary | ICD-10-CM

## 2014-06-11 DIAGNOSIS — E782 Mixed hyperlipidemia: Secondary | ICD-10-CM

## 2014-06-11 DIAGNOSIS — I2510 Atherosclerotic heart disease of native coronary artery without angina pectoris: Secondary | ICD-10-CM

## 2014-06-11 NOTE — Assessment & Plan Note (Signed)
Continues on Lipitor, labs followed by primary care.

## 2014-06-11 NOTE — Patient Instructions (Signed)

## 2014-06-11 NOTE — Progress Notes (Signed)
Clinical Summary Julia Santana is a 73 y.o.female last seen in January. She states that she has been doing well, no angina symptoms, reports compliance with her medications. Still works full time.  She has not been exercising, we talked about a walking regimen. ECG today shows sinus rhythm with evidence of old anterior infarct, nonspecific ST changes.  Lexiscan Cardiolite from October 2014 demonstrated soft tissue attenuation without ischemia, LVEF 67%.  Allergies  Allergen Reactions  . Losartan Potassium Other (See Comments)    Unsure of what happened when she took this  . Furosemide Cough    Current Outpatient Prescriptions  Medication Sig Dispense Refill  . acetaminophen (TYLENOL) 500 MG tablet Take 500-1,000 mg by mouth as needed. For pain      . allopurinol (ZYLOPRIM) 100 MG tablet Take 100 mg by mouth daily.      Marland Kitchen aspirin 81 MG tablet Take 81 mg by mouth daily.      Marland Kitchen atorvastatin (LIPITOR) 40 MG tablet Take 1 tablet (40 mg total) by mouth every evening.  30 tablet  11  . Budesonide-Formoterol Fumarate (SYMBICORT IN) Inhale into the lungs as needed.      . bumetanide (BUMEX) 2 MG tablet Take 1 tablet (2 mg total) by mouth daily.  30 tablet  0  . losartan (COZAAR) 100 MG tablet Take 100 mg by mouth daily.      . metoprolol succinate (TOPROL-XL) 25 MG 24 hr tablet Take 25 mg by mouth daily.      Marland Kitchen omeprazole (PRILOSEC) 20 MG capsule Take 20 mg by mouth daily.       No current facility-administered medications for this visit.    Past Medical History  Diagnosis Date  . CKD (chronic kidney disease) stage 3, GFR 30-59 ml/min   . Essential hypertension, benign   . Asthma   . Mixed hyperlipidemia   . Hyperparathyroidism   . Seasonal allergies   . Coronary atherosclerosis of native coronary artery     Multivessel status post CABG 8/13    Past Surgical History  Procedure Laterality Date  . Parathyroidectomy  12/15/2011    Procedure: PARATHYROIDECTOMY;  Surgeon: Julia So, MD;  Location: [facility];  Service: General;  Laterality: Bilateral;  . Coronary artery bypass graft  03/28/2012    Procedure: CORONARY ARTERY BYPASS GRAFTING (CABG);  Surgeon: Julia Poot, MD;  Location: [facility];  Service: Open Heart Surgery;  Laterality: N/A;  times 3, using Left Internal Mammary and Right Greater Saphenous  Vein Graft harvested endoscopically    Social History Julia Santana reports that she quit smoking about 33 years ago. Her smoking use included Cigarettes. She has a 20 pack-year smoking history. She has never used smokeless tobacco. Julia Santana reports that she does not drink alcohol.  Review of Systems No palpitations or syncope, no claudication. No orthopnea or PND. Other systems reviewed and negative.  Physical Examination Filed Vitals:   06/11/14 1441  BP: 146/85  Pulse: 73   Filed Weights   06/11/14 1441  Weight: 202 lb 8 oz (91.853 kg)    No acute distress.  HEENT: Conjunctiva and lids normal, oropharynx clear.  Neck: Supple, no elevated JVP or carotid bruits, no thyromegaly.  Lungs: Clear to auscultation, nonlabored breathing at rest.  Cardiac: Regular rate and rhythm, no S3 or significant systolic murmur, no pericardial rub.  Thorax: Well healed sternal incision.  Abdomen: Soft, nontender, bowel sounds present.  Extremities: No pitting edema, distal pulses 2+.  Problem List and Plan   Coronary atherosclerosis of native coronary artery No change in symptom status with history of multivessel disease status post CABG in 2013, negative ischemic workup in 2014. Continue medical therapy and annual visit. Encouraged walking regimen for exercise.  Mixed hyperlipidemia Continues on Lipitor, labs followed by primary care.    Satira Sark, M.D., F.A.C.C.

## 2014-06-11 NOTE — Assessment & Plan Note (Signed)
No change in symptom status with history of multivessel disease status post CABG in 2013, negative ischemic workup in 2014. Continue medical therapy and annual visit. Encouraged walking regimen for exercise.

## 2014-06-20 ENCOUNTER — Ambulatory Visit (INDEPENDENT_AMBULATORY_CARE_PROVIDER_SITE_OTHER): Payer: Commercial Managed Care - HMO | Admitting: *Deleted

## 2014-06-20 DIAGNOSIS — Z23 Encounter for immunization: Secondary | ICD-10-CM

## 2014-09-08 DIAGNOSIS — R739 Hyperglycemia, unspecified: Secondary | ICD-10-CM | POA: Diagnosis not present

## 2014-09-08 DIAGNOSIS — I10 Essential (primary) hypertension: Secondary | ICD-10-CM | POA: Diagnosis not present

## 2014-09-08 DIAGNOSIS — E782 Mixed hyperlipidemia: Secondary | ICD-10-CM | POA: Diagnosis not present

## 2014-09-08 DIAGNOSIS — K219 Gastro-esophageal reflux disease without esophagitis: Secondary | ICD-10-CM | POA: Diagnosis not present

## 2014-09-15 DIAGNOSIS — J4530 Mild persistent asthma, uncomplicated: Secondary | ICD-10-CM | POA: Diagnosis not present

## 2014-09-15 DIAGNOSIS — K219 Gastro-esophageal reflux disease without esophagitis: Secondary | ICD-10-CM | POA: Diagnosis not present

## 2014-09-15 DIAGNOSIS — I10 Essential (primary) hypertension: Secondary | ICD-10-CM | POA: Diagnosis not present

## 2014-09-15 DIAGNOSIS — M1000 Idiopathic gout, unspecified site: Secondary | ICD-10-CM | POA: Diagnosis not present

## 2014-09-15 DIAGNOSIS — E782 Mixed hyperlipidemia: Secondary | ICD-10-CM | POA: Diagnosis not present

## 2014-09-15 DIAGNOSIS — Z1389 Encounter for screening for other disorder: Secondary | ICD-10-CM | POA: Diagnosis not present

## 2014-09-15 DIAGNOSIS — E213 Hyperparathyroidism, unspecified: Secondary | ICD-10-CM | POA: Diagnosis not present

## 2014-09-15 DIAGNOSIS — H8111 Benign paroxysmal vertigo, right ear: Secondary | ICD-10-CM | POA: Diagnosis not present

## 2015-03-10 DIAGNOSIS — K219 Gastro-esophageal reflux disease without esophagitis: Secondary | ICD-10-CM | POA: Diagnosis not present

## 2015-03-10 DIAGNOSIS — M1000 Idiopathic gout, unspecified site: Secondary | ICD-10-CM | POA: Diagnosis not present

## 2015-03-10 DIAGNOSIS — E782 Mixed hyperlipidemia: Secondary | ICD-10-CM | POA: Diagnosis not present

## 2015-03-10 DIAGNOSIS — J4530 Mild persistent asthma, uncomplicated: Secondary | ICD-10-CM | POA: Diagnosis not present

## 2015-03-10 DIAGNOSIS — E213 Hyperparathyroidism, unspecified: Secondary | ICD-10-CM | POA: Diagnosis not present

## 2015-03-10 DIAGNOSIS — I10 Essential (primary) hypertension: Secondary | ICD-10-CM | POA: Diagnosis not present

## 2015-09-17 DIAGNOSIS — R739 Hyperglycemia, unspecified: Secondary | ICD-10-CM | POA: Diagnosis not present

## 2015-09-17 DIAGNOSIS — K219 Gastro-esophageal reflux disease without esophagitis: Secondary | ICD-10-CM | POA: Diagnosis not present

## 2015-09-17 DIAGNOSIS — E782 Mixed hyperlipidemia: Secondary | ICD-10-CM | POA: Diagnosis not present

## 2015-09-17 DIAGNOSIS — I10 Essential (primary) hypertension: Secondary | ICD-10-CM | POA: Diagnosis not present

## 2015-09-17 DIAGNOSIS — E213 Hyperparathyroidism, unspecified: Secondary | ICD-10-CM | POA: Diagnosis not present

## 2015-09-24 DIAGNOSIS — J4530 Mild persistent asthma, uncomplicated: Secondary | ICD-10-CM | POA: Diagnosis not present

## 2015-09-24 DIAGNOSIS — M1000 Idiopathic gout, unspecified site: Secondary | ICD-10-CM | POA: Diagnosis not present

## 2015-09-24 DIAGNOSIS — K219 Gastro-esophageal reflux disease without esophagitis: Secondary | ICD-10-CM | POA: Diagnosis not present

## 2015-09-24 DIAGNOSIS — E782 Mixed hyperlipidemia: Secondary | ICD-10-CM | POA: Diagnosis not present

## 2015-09-24 DIAGNOSIS — E213 Hyperparathyroidism, unspecified: Secondary | ICD-10-CM | POA: Diagnosis not present

## 2015-09-24 DIAGNOSIS — I10 Essential (primary) hypertension: Secondary | ICD-10-CM | POA: Diagnosis not present

## 2015-09-24 DIAGNOSIS — Z23 Encounter for immunization: Secondary | ICD-10-CM | POA: Diagnosis not present

## 2016-01-03 DIAGNOSIS — H8111 Benign paroxysmal vertigo, right ear: Secondary | ICD-10-CM | POA: Diagnosis not present

## 2016-01-31 DIAGNOSIS — I10 Essential (primary) hypertension: Secondary | ICD-10-CM | POA: Diagnosis not present

## 2016-01-31 DIAGNOSIS — E782 Mixed hyperlipidemia: Secondary | ICD-10-CM | POA: Diagnosis not present

## 2016-02-04 DIAGNOSIS — E213 Hyperparathyroidism, unspecified: Secondary | ICD-10-CM | POA: Diagnosis not present

## 2016-02-04 DIAGNOSIS — J4530 Mild persistent asthma, uncomplicated: Secondary | ICD-10-CM | POA: Diagnosis not present

## 2016-02-04 DIAGNOSIS — M1000 Idiopathic gout, unspecified site: Secondary | ICD-10-CM | POA: Diagnosis not present

## 2016-02-04 DIAGNOSIS — E782 Mixed hyperlipidemia: Secondary | ICD-10-CM | POA: Diagnosis not present

## 2016-02-04 DIAGNOSIS — K219 Gastro-esophageal reflux disease without esophagitis: Secondary | ICD-10-CM | POA: Diagnosis not present

## 2016-02-04 DIAGNOSIS — I10 Essential (primary) hypertension: Secondary | ICD-10-CM | POA: Diagnosis not present

## 2016-02-04 DIAGNOSIS — M25511 Pain in right shoulder: Secondary | ICD-10-CM | POA: Diagnosis not present

## 2016-07-03 DIAGNOSIS — M1000 Idiopathic gout, unspecified site: Secondary | ICD-10-CM | POA: Diagnosis not present

## 2016-07-03 DIAGNOSIS — Z23 Encounter for immunization: Secondary | ICD-10-CM | POA: Diagnosis not present

## 2016-07-03 DIAGNOSIS — K219 Gastro-esophageal reflux disease without esophagitis: Secondary | ICD-10-CM | POA: Diagnosis not present

## 2016-07-03 DIAGNOSIS — E782 Mixed hyperlipidemia: Secondary | ICD-10-CM | POA: Diagnosis not present

## 2016-07-03 DIAGNOSIS — E213 Hyperparathyroidism, unspecified: Secondary | ICD-10-CM | POA: Diagnosis not present

## 2016-07-03 DIAGNOSIS — Z6835 Body mass index (BMI) 35.0-35.9, adult: Secondary | ICD-10-CM | POA: Diagnosis not present

## 2016-07-03 DIAGNOSIS — J4530 Mild persistent asthma, uncomplicated: Secondary | ICD-10-CM | POA: Diagnosis not present

## 2016-07-03 DIAGNOSIS — I10 Essential (primary) hypertension: Secondary | ICD-10-CM | POA: Diagnosis not present

## 2016-07-24 DIAGNOSIS — Z6835 Body mass index (BMI) 35.0-35.9, adult: Secondary | ICD-10-CM | POA: Diagnosis not present

## 2016-07-24 DIAGNOSIS — R609 Edema, unspecified: Secondary | ICD-10-CM | POA: Diagnosis not present

## 2016-08-30 DIAGNOSIS — H25013 Cortical age-related cataract, bilateral: Secondary | ICD-10-CM | POA: Diagnosis not present

## 2016-08-30 DIAGNOSIS — H2511 Age-related nuclear cataract, right eye: Secondary | ICD-10-CM | POA: Diagnosis not present

## 2016-08-30 DIAGNOSIS — H2512 Age-related nuclear cataract, left eye: Secondary | ICD-10-CM | POA: Diagnosis not present

## 2016-08-30 DIAGNOSIS — H02831 Dermatochalasis of right upper eyelid: Secondary | ICD-10-CM | POA: Diagnosis not present

## 2016-09-07 DIAGNOSIS — H2512 Age-related nuclear cataract, left eye: Secondary | ICD-10-CM | POA: Diagnosis not present

## 2016-11-02 DIAGNOSIS — H2511 Age-related nuclear cataract, right eye: Secondary | ICD-10-CM | POA: Diagnosis not present

## 2016-11-16 DIAGNOSIS — H02413 Mechanical ptosis of bilateral eyelids: Secondary | ICD-10-CM | POA: Diagnosis not present

## 2016-12-25 DIAGNOSIS — E213 Hyperparathyroidism, unspecified: Secondary | ICD-10-CM | POA: Diagnosis not present

## 2016-12-25 DIAGNOSIS — R739 Hyperglycemia, unspecified: Secondary | ICD-10-CM | POA: Diagnosis not present

## 2016-12-25 DIAGNOSIS — I10 Essential (primary) hypertension: Secondary | ICD-10-CM | POA: Diagnosis not present

## 2016-12-25 DIAGNOSIS — K219 Gastro-esophageal reflux disease without esophagitis: Secondary | ICD-10-CM | POA: Diagnosis not present

## 2016-12-25 DIAGNOSIS — E782 Mixed hyperlipidemia: Secondary | ICD-10-CM | POA: Diagnosis not present

## 2016-12-29 DIAGNOSIS — E213 Hyperparathyroidism, unspecified: Secondary | ICD-10-CM | POA: Diagnosis not present

## 2016-12-29 DIAGNOSIS — I10 Essential (primary) hypertension: Secondary | ICD-10-CM | POA: Diagnosis not present

## 2016-12-29 DIAGNOSIS — M1000 Idiopathic gout, unspecified site: Secondary | ICD-10-CM | POA: Diagnosis not present

## 2016-12-29 DIAGNOSIS — K219 Gastro-esophageal reflux disease without esophagitis: Secondary | ICD-10-CM | POA: Diagnosis not present

## 2016-12-29 DIAGNOSIS — E782 Mixed hyperlipidemia: Secondary | ICD-10-CM | POA: Diagnosis not present

## 2016-12-29 DIAGNOSIS — I5031 Acute diastolic (congestive) heart failure: Secondary | ICD-10-CM | POA: Diagnosis not present

## 2016-12-29 DIAGNOSIS — Z6834 Body mass index (BMI) 34.0-34.9, adult: Secondary | ICD-10-CM | POA: Diagnosis not present

## 2017-01-01 DIAGNOSIS — I10 Essential (primary) hypertension: Secondary | ICD-10-CM | POA: Diagnosis not present

## 2017-01-01 DIAGNOSIS — N183 Chronic kidney disease, stage 3 (moderate): Secondary | ICD-10-CM | POA: Diagnosis not present

## 2017-01-01 DIAGNOSIS — Z1389 Encounter for screening for other disorder: Secondary | ICD-10-CM | POA: Diagnosis not present

## 2017-01-01 DIAGNOSIS — Z6834 Body mass index (BMI) 34.0-34.9, adult: Secondary | ICD-10-CM | POA: Diagnosis not present

## 2017-01-01 DIAGNOSIS — I5031 Acute diastolic (congestive) heart failure: Secondary | ICD-10-CM | POA: Diagnosis not present

## 2017-01-01 DIAGNOSIS — M1000 Idiopathic gout, unspecified site: Secondary | ICD-10-CM | POA: Diagnosis not present

## 2017-01-08 DIAGNOSIS — I5031 Acute diastolic (congestive) heart failure: Secondary | ICD-10-CM | POA: Diagnosis not present

## 2017-01-08 DIAGNOSIS — I110 Hypertensive heart disease with heart failure: Secondary | ICD-10-CM | POA: Diagnosis not present

## 2017-01-16 DIAGNOSIS — R739 Hyperglycemia, unspecified: Secondary | ICD-10-CM | POA: Diagnosis not present

## 2017-01-16 DIAGNOSIS — I10 Essential (primary) hypertension: Secondary | ICD-10-CM | POA: Diagnosis not present

## 2017-01-16 DIAGNOSIS — K219 Gastro-esophageal reflux disease without esophagitis: Secondary | ICD-10-CM | POA: Diagnosis not present

## 2017-01-16 DIAGNOSIS — E213 Hyperparathyroidism, unspecified: Secondary | ICD-10-CM | POA: Diagnosis not present

## 2017-01-16 DIAGNOSIS — N183 Chronic kidney disease, stage 3 (moderate): Secondary | ICD-10-CM | POA: Diagnosis not present

## 2017-02-06 DIAGNOSIS — N183 Chronic kidney disease, stage 3 (moderate): Secondary | ICD-10-CM | POA: Diagnosis not present

## 2017-02-06 DIAGNOSIS — I10 Essential (primary) hypertension: Secondary | ICD-10-CM | POA: Diagnosis not present

## 2017-02-06 DIAGNOSIS — R739 Hyperglycemia, unspecified: Secondary | ICD-10-CM | POA: Diagnosis not present

## 2017-03-22 DIAGNOSIS — Z6834 Body mass index (BMI) 34.0-34.9, adult: Secondary | ICD-10-CM | POA: Diagnosis not present

## 2017-03-22 DIAGNOSIS — M25551 Pain in right hip: Secondary | ICD-10-CM | POA: Diagnosis not present

## 2017-05-02 DIAGNOSIS — D519 Vitamin B12 deficiency anemia, unspecified: Secondary | ICD-10-CM | POA: Diagnosis not present

## 2017-05-02 DIAGNOSIS — E213 Hyperparathyroidism, unspecified: Secondary | ICD-10-CM | POA: Diagnosis not present

## 2017-05-02 DIAGNOSIS — M7061 Trochanteric bursitis, right hip: Secondary | ICD-10-CM | POA: Diagnosis not present

## 2017-05-02 DIAGNOSIS — K219 Gastro-esophageal reflux disease without esophagitis: Secondary | ICD-10-CM | POA: Diagnosis not present

## 2017-05-02 DIAGNOSIS — I5031 Acute diastolic (congestive) heart failure: Secondary | ICD-10-CM | POA: Diagnosis not present

## 2017-05-02 DIAGNOSIS — Z6833 Body mass index (BMI) 33.0-33.9, adult: Secondary | ICD-10-CM | POA: Diagnosis not present

## 2017-05-02 DIAGNOSIS — E782 Mixed hyperlipidemia: Secondary | ICD-10-CM | POA: Diagnosis not present

## 2017-05-02 DIAGNOSIS — M1000 Idiopathic gout, unspecified site: Secondary | ICD-10-CM | POA: Diagnosis not present

## 2017-05-02 DIAGNOSIS — D509 Iron deficiency anemia, unspecified: Secondary | ICD-10-CM | POA: Diagnosis not present

## 2017-05-02 DIAGNOSIS — I10 Essential (primary) hypertension: Secondary | ICD-10-CM | POA: Diagnosis not present

## 2017-05-02 DIAGNOSIS — D529 Folate deficiency anemia, unspecified: Secondary | ICD-10-CM | POA: Diagnosis not present

## 2017-07-31 DIAGNOSIS — Z23 Encounter for immunization: Secondary | ICD-10-CM | POA: Diagnosis not present

## 2017-09-03 DIAGNOSIS — I10 Essential (primary) hypertension: Secondary | ICD-10-CM | POA: Diagnosis not present

## 2017-09-03 DIAGNOSIS — Z6832 Body mass index (BMI) 32.0-32.9, adult: Secondary | ICD-10-CM | POA: Diagnosis not present

## 2017-09-03 DIAGNOSIS — M1000 Idiopathic gout, unspecified site: Secondary | ICD-10-CM | POA: Diagnosis not present

## 2017-09-03 DIAGNOSIS — K219 Gastro-esophageal reflux disease without esophagitis: Secondary | ICD-10-CM | POA: Diagnosis not present

## 2017-09-03 DIAGNOSIS — N183 Chronic kidney disease, stage 3 (moderate): Secondary | ICD-10-CM | POA: Diagnosis not present

## 2017-09-03 DIAGNOSIS — E213 Hyperparathyroidism, unspecified: Secondary | ICD-10-CM | POA: Diagnosis not present

## 2017-09-03 DIAGNOSIS — E782 Mixed hyperlipidemia: Secondary | ICD-10-CM | POA: Diagnosis not present

## 2017-09-03 DIAGNOSIS — M7061 Trochanteric bursitis, right hip: Secondary | ICD-10-CM | POA: Diagnosis not present

## 2017-09-03 DIAGNOSIS — Z0001 Encounter for general adult medical examination with abnormal findings: Secondary | ICD-10-CM | POA: Diagnosis not present

## 2017-09-03 DIAGNOSIS — I5031 Acute diastolic (congestive) heart failure: Secondary | ICD-10-CM | POA: Diagnosis not present

## 2017-09-14 DIAGNOSIS — N281 Cyst of kidney, acquired: Secondary | ICD-10-CM | POA: Diagnosis not present

## 2017-09-14 DIAGNOSIS — N183 Chronic kidney disease, stage 3 (moderate): Secondary | ICD-10-CM | POA: Diagnosis not present

## 2017-09-25 DIAGNOSIS — N184 Chronic kidney disease, stage 4 (severe): Secondary | ICD-10-CM | POA: Diagnosis not present

## 2017-09-25 DIAGNOSIS — I10 Essential (primary) hypertension: Secondary | ICD-10-CM | POA: Diagnosis not present

## 2017-09-25 DIAGNOSIS — Z6833 Body mass index (BMI) 33.0-33.9, adult: Secondary | ICD-10-CM | POA: Diagnosis not present

## 2017-10-26 DIAGNOSIS — I10 Essential (primary) hypertension: Secondary | ICD-10-CM | POA: Diagnosis not present

## 2017-10-26 DIAGNOSIS — D649 Anemia, unspecified: Secondary | ICD-10-CM | POA: Diagnosis not present

## 2017-10-26 DIAGNOSIS — N184 Chronic kidney disease, stage 4 (severe): Secondary | ICD-10-CM | POA: Diagnosis not present

## 2017-10-26 DIAGNOSIS — R809 Proteinuria, unspecified: Secondary | ICD-10-CM | POA: Diagnosis not present

## 2017-11-09 DIAGNOSIS — R809 Proteinuria, unspecified: Secondary | ICD-10-CM | POA: Diagnosis not present

## 2017-11-09 DIAGNOSIS — I129 Hypertensive chronic kidney disease with stage 1 through stage 4 chronic kidney disease, or unspecified chronic kidney disease: Secondary | ICD-10-CM | POA: Diagnosis not present

## 2017-11-09 DIAGNOSIS — N183 Chronic kidney disease, stage 3 (moderate): Secondary | ICD-10-CM | POA: Diagnosis not present

## 2017-11-09 DIAGNOSIS — D509 Iron deficiency anemia, unspecified: Secondary | ICD-10-CM | POA: Diagnosis not present

## 2017-11-09 DIAGNOSIS — Z79899 Other long term (current) drug therapy: Secondary | ICD-10-CM | POA: Diagnosis not present

## 2017-11-09 DIAGNOSIS — E559 Vitamin D deficiency, unspecified: Secondary | ICD-10-CM | POA: Diagnosis not present

## 2017-11-09 DIAGNOSIS — Z1159 Encounter for screening for other viral diseases: Secondary | ICD-10-CM | POA: Diagnosis not present

## 2017-11-09 DIAGNOSIS — D519 Vitamin B12 deficiency anemia, unspecified: Secondary | ICD-10-CM | POA: Diagnosis not present

## 2017-11-15 DIAGNOSIS — E559 Vitamin D deficiency, unspecified: Secondary | ICD-10-CM | POA: Diagnosis not present

## 2017-11-15 DIAGNOSIS — N183 Chronic kidney disease, stage 3 (moderate): Secondary | ICD-10-CM | POA: Diagnosis not present

## 2017-11-15 DIAGNOSIS — N2581 Secondary hyperparathyroidism of renal origin: Secondary | ICD-10-CM | POA: Diagnosis not present

## 2017-12-20 DIAGNOSIS — I160 Hypertensive urgency: Secondary | ICD-10-CM | POA: Diagnosis not present

## 2017-12-20 DIAGNOSIS — M1A9XX0 Chronic gout, unspecified, without tophus (tophi): Secondary | ICD-10-CM | POA: Diagnosis not present

## 2017-12-20 DIAGNOSIS — E213 Hyperparathyroidism, unspecified: Secondary | ICD-10-CM | POA: Diagnosis not present

## 2017-12-20 DIAGNOSIS — Z87891 Personal history of nicotine dependence: Secondary | ICD-10-CM | POA: Diagnosis not present

## 2017-12-20 DIAGNOSIS — I130 Hypertensive heart and chronic kidney disease with heart failure and stage 1 through stage 4 chronic kidney disease, or unspecified chronic kidney disease: Secondary | ICD-10-CM | POA: Diagnosis not present

## 2017-12-20 DIAGNOSIS — E785 Hyperlipidemia, unspecified: Secondary | ICD-10-CM | POA: Diagnosis not present

## 2017-12-20 DIAGNOSIS — D649 Anemia, unspecified: Secondary | ICD-10-CM | POA: Diagnosis not present

## 2017-12-20 DIAGNOSIS — R0789 Other chest pain: Secondary | ICD-10-CM | POA: Diagnosis not present

## 2017-12-20 DIAGNOSIS — I5032 Chronic diastolic (congestive) heart failure: Secondary | ICD-10-CM | POA: Diagnosis not present

## 2017-12-20 DIAGNOSIS — N183 Chronic kidney disease, stage 3 (moderate): Secondary | ICD-10-CM | POA: Diagnosis not present

## 2017-12-20 DIAGNOSIS — R079 Chest pain, unspecified: Secondary | ICD-10-CM | POA: Diagnosis not present

## 2017-12-20 DIAGNOSIS — R0602 Shortness of breath: Secondary | ICD-10-CM | POA: Diagnosis not present

## 2017-12-21 DIAGNOSIS — R079 Chest pain, unspecified: Secondary | ICD-10-CM | POA: Diagnosis not present

## 2017-12-24 DIAGNOSIS — R079 Chest pain, unspecified: Secondary | ICD-10-CM | POA: Diagnosis not present

## 2017-12-24 DIAGNOSIS — I10 Essential (primary) hypertension: Secondary | ICD-10-CM | POA: Diagnosis not present

## 2017-12-24 DIAGNOSIS — Z6833 Body mass index (BMI) 33.0-33.9, adult: Secondary | ICD-10-CM | POA: Diagnosis not present

## 2017-12-31 DIAGNOSIS — I5031 Acute diastolic (congestive) heart failure: Secondary | ICD-10-CM | POA: Diagnosis not present

## 2017-12-31 DIAGNOSIS — Z6832 Body mass index (BMI) 32.0-32.9, adult: Secondary | ICD-10-CM | POA: Diagnosis not present

## 2017-12-31 DIAGNOSIS — I130 Hypertensive heart and chronic kidney disease with heart failure and stage 1 through stage 4 chronic kidney disease, or unspecified chronic kidney disease: Secondary | ICD-10-CM | POA: Diagnosis not present

## 2017-12-31 DIAGNOSIS — N184 Chronic kidney disease, stage 4 (severe): Secondary | ICD-10-CM | POA: Diagnosis not present

## 2017-12-31 DIAGNOSIS — I10 Essential (primary) hypertension: Secondary | ICD-10-CM | POA: Diagnosis not present

## 2017-12-31 DIAGNOSIS — M1000 Idiopathic gout, unspecified site: Secondary | ICD-10-CM | POA: Diagnosis not present

## 2017-12-31 DIAGNOSIS — K219 Gastro-esophageal reflux disease without esophagitis: Secondary | ICD-10-CM | POA: Diagnosis not present

## 2017-12-31 DIAGNOSIS — E213 Hyperparathyroidism, unspecified: Secondary | ICD-10-CM | POA: Diagnosis not present

## 2017-12-31 DIAGNOSIS — E782 Mixed hyperlipidemia: Secondary | ICD-10-CM | POA: Diagnosis not present

## 2018-01-15 DIAGNOSIS — R05 Cough: Secondary | ICD-10-CM | POA: Diagnosis not present

## 2018-01-15 DIAGNOSIS — Z6831 Body mass index (BMI) 31.0-31.9, adult: Secondary | ICD-10-CM | POA: Diagnosis not present

## 2018-01-20 DIAGNOSIS — I10 Essential (primary) hypertension: Secondary | ICD-10-CM | POA: Diagnosis not present

## 2018-01-20 DIAGNOSIS — Z6833 Body mass index (BMI) 33.0-33.9, adult: Secondary | ICD-10-CM | POA: Diagnosis not present

## 2018-01-20 DIAGNOSIS — R079 Chest pain, unspecified: Secondary | ICD-10-CM | POA: Diagnosis not present

## 2018-02-18 DIAGNOSIS — E559 Vitamin D deficiency, unspecified: Secondary | ICD-10-CM | POA: Diagnosis not present

## 2018-02-18 DIAGNOSIS — N183 Chronic kidney disease, stage 3 (moderate): Secondary | ICD-10-CM | POA: Diagnosis not present

## 2018-02-18 DIAGNOSIS — Z79899 Other long term (current) drug therapy: Secondary | ICD-10-CM | POA: Diagnosis not present

## 2018-02-18 DIAGNOSIS — R809 Proteinuria, unspecified: Secondary | ICD-10-CM | POA: Diagnosis not present

## 2018-02-18 DIAGNOSIS — I129 Hypertensive chronic kidney disease with stage 1 through stage 4 chronic kidney disease, or unspecified chronic kidney disease: Secondary | ICD-10-CM | POA: Diagnosis not present

## 2018-02-27 DIAGNOSIS — N184 Chronic kidney disease, stage 4 (severe): Secondary | ICD-10-CM | POA: Diagnosis not present

## 2018-02-27 DIAGNOSIS — D649 Anemia, unspecified: Secondary | ICD-10-CM | POA: Diagnosis not present

## 2018-02-27 DIAGNOSIS — N179 Acute kidney failure, unspecified: Secondary | ICD-10-CM | POA: Diagnosis not present

## 2018-04-15 DIAGNOSIS — N184 Chronic kidney disease, stage 4 (severe): Secondary | ICD-10-CM | POA: Diagnosis not present

## 2018-04-15 DIAGNOSIS — I129 Hypertensive chronic kidney disease with stage 1 through stage 4 chronic kidney disease, or unspecified chronic kidney disease: Secondary | ICD-10-CM | POA: Diagnosis not present

## 2018-04-15 DIAGNOSIS — Z79899 Other long term (current) drug therapy: Secondary | ICD-10-CM | POA: Diagnosis not present

## 2018-04-15 DIAGNOSIS — E559 Vitamin D deficiency, unspecified: Secondary | ICD-10-CM | POA: Diagnosis not present

## 2018-04-15 DIAGNOSIS — D509 Iron deficiency anemia, unspecified: Secondary | ICD-10-CM | POA: Diagnosis not present

## 2018-04-15 DIAGNOSIS — R809 Proteinuria, unspecified: Secondary | ICD-10-CM | POA: Diagnosis not present

## 2018-04-17 DIAGNOSIS — N184 Chronic kidney disease, stage 4 (severe): Secondary | ICD-10-CM | POA: Diagnosis not present

## 2018-04-17 DIAGNOSIS — D638 Anemia in other chronic diseases classified elsewhere: Secondary | ICD-10-CM | POA: Diagnosis not present

## 2018-05-09 DIAGNOSIS — E213 Hyperparathyroidism, unspecified: Secondary | ICD-10-CM | POA: Diagnosis not present

## 2018-05-09 DIAGNOSIS — I5031 Acute diastolic (congestive) heart failure: Secondary | ICD-10-CM | POA: Diagnosis not present

## 2018-05-09 DIAGNOSIS — Z6832 Body mass index (BMI) 32.0-32.9, adult: Secondary | ICD-10-CM | POA: Diagnosis not present

## 2018-05-09 DIAGNOSIS — I10 Essential (primary) hypertension: Secondary | ICD-10-CM | POA: Diagnosis not present

## 2018-05-09 DIAGNOSIS — E782 Mixed hyperlipidemia: Secondary | ICD-10-CM | POA: Diagnosis not present

## 2018-05-09 DIAGNOSIS — Z23 Encounter for immunization: Secondary | ICD-10-CM | POA: Diagnosis not present

## 2018-05-09 DIAGNOSIS — M1000 Idiopathic gout, unspecified site: Secondary | ICD-10-CM | POA: Diagnosis not present

## 2018-05-09 DIAGNOSIS — N184 Chronic kidney disease, stage 4 (severe): Secondary | ICD-10-CM | POA: Diagnosis not present

## 2018-06-03 DIAGNOSIS — Z79899 Other long term (current) drug therapy: Secondary | ICD-10-CM | POA: Diagnosis not present

## 2018-06-03 DIAGNOSIS — I129 Hypertensive chronic kidney disease with stage 1 through stage 4 chronic kidney disease, or unspecified chronic kidney disease: Secondary | ICD-10-CM | POA: Diagnosis not present

## 2018-06-03 DIAGNOSIS — R809 Proteinuria, unspecified: Secondary | ICD-10-CM | POA: Diagnosis not present

## 2018-06-03 DIAGNOSIS — D509 Iron deficiency anemia, unspecified: Secondary | ICD-10-CM | POA: Diagnosis not present

## 2018-06-03 DIAGNOSIS — N183 Chronic kidney disease, stage 3 (moderate): Secondary | ICD-10-CM | POA: Diagnosis not present

## 2018-06-03 DIAGNOSIS — E559 Vitamin D deficiency, unspecified: Secondary | ICD-10-CM | POA: Diagnosis not present

## 2018-06-03 LAB — BASIC METABOLIC PANEL
BUN: 31 — AB (ref 4–21)
CREATININE: 2.4 — AB (ref 0.5–1.1)

## 2018-06-03 LAB — VITAMIN D 25 HYDROXY (VIT D DEFICIENCY, FRACTURES): VIT D 25 HYDROXY: 32.9

## 2018-06-05 DIAGNOSIS — R809 Proteinuria, unspecified: Secondary | ICD-10-CM | POA: Diagnosis not present

## 2018-06-05 DIAGNOSIS — D638 Anemia in other chronic diseases classified elsewhere: Secondary | ICD-10-CM | POA: Diagnosis not present

## 2018-06-05 DIAGNOSIS — N184 Chronic kidney disease, stage 4 (severe): Secondary | ICD-10-CM | POA: Diagnosis not present

## 2018-06-05 DIAGNOSIS — I10 Essential (primary) hypertension: Secondary | ICD-10-CM | POA: Diagnosis not present

## 2018-07-31 DIAGNOSIS — N183 Chronic kidney disease, stage 3 (moderate): Secondary | ICD-10-CM | POA: Diagnosis not present

## 2018-07-31 DIAGNOSIS — Z79899 Other long term (current) drug therapy: Secondary | ICD-10-CM | POA: Diagnosis not present

## 2018-07-31 DIAGNOSIS — R809 Proteinuria, unspecified: Secondary | ICD-10-CM | POA: Diagnosis not present

## 2018-07-31 DIAGNOSIS — E559 Vitamin D deficiency, unspecified: Secondary | ICD-10-CM | POA: Diagnosis not present

## 2018-07-31 DIAGNOSIS — D509 Iron deficiency anemia, unspecified: Secondary | ICD-10-CM | POA: Diagnosis not present

## 2018-07-31 DIAGNOSIS — I129 Hypertensive chronic kidney disease with stage 1 through stage 4 chronic kidney disease, or unspecified chronic kidney disease: Secondary | ICD-10-CM | POA: Diagnosis not present

## 2018-08-07 DIAGNOSIS — E559 Vitamin D deficiency, unspecified: Secondary | ICD-10-CM | POA: Diagnosis not present

## 2018-08-07 DIAGNOSIS — E210 Primary hyperparathyroidism: Secondary | ICD-10-CM | POA: Diagnosis not present

## 2018-08-07 DIAGNOSIS — D638 Anemia in other chronic diseases classified elsewhere: Secondary | ICD-10-CM | POA: Diagnosis not present

## 2018-08-07 DIAGNOSIS — N184 Chronic kidney disease, stage 4 (severe): Secondary | ICD-10-CM | POA: Diagnosis not present

## 2018-08-07 DIAGNOSIS — R809 Proteinuria, unspecified: Secondary | ICD-10-CM | POA: Diagnosis not present

## 2018-09-04 DIAGNOSIS — Z6832 Body mass index (BMI) 32.0-32.9, adult: Secondary | ICD-10-CM | POA: Diagnosis not present

## 2018-09-04 DIAGNOSIS — I5031 Acute diastolic (congestive) heart failure: Secondary | ICD-10-CM | POA: Diagnosis not present

## 2018-09-04 DIAGNOSIS — N184 Chronic kidney disease, stage 4 (severe): Secondary | ICD-10-CM | POA: Diagnosis not present

## 2018-09-04 DIAGNOSIS — M1000 Idiopathic gout, unspecified site: Secondary | ICD-10-CM | POA: Diagnosis not present

## 2018-09-04 DIAGNOSIS — E213 Hyperparathyroidism, unspecified: Secondary | ICD-10-CM | POA: Diagnosis not present

## 2018-09-04 DIAGNOSIS — E782 Mixed hyperlipidemia: Secondary | ICD-10-CM | POA: Diagnosis not present

## 2018-09-04 DIAGNOSIS — I10 Essential (primary) hypertension: Secondary | ICD-10-CM | POA: Diagnosis not present

## 2018-09-04 DIAGNOSIS — K219 Gastro-esophageal reflux disease without esophagitis: Secondary | ICD-10-CM | POA: Diagnosis not present

## 2018-09-12 ENCOUNTER — Encounter: Payer: Self-pay | Admitting: "Endocrinology

## 2018-09-12 ENCOUNTER — Ambulatory Visit: Payer: Medicare HMO | Admitting: "Endocrinology

## 2018-09-12 VITALS — BP 143/87 | HR 63 | Ht 68.0 in | Wt 193.0 lb

## 2018-09-12 DIAGNOSIS — E212 Other hyperparathyroidism: Secondary | ICD-10-CM | POA: Diagnosis not present

## 2018-09-12 DIAGNOSIS — E049 Nontoxic goiter, unspecified: Secondary | ICD-10-CM

## 2018-09-12 NOTE — Progress Notes (Signed)
Endocrinology Consult Note                                            09/12/2018, 4:56 PM   Subjective:    Patient ID: Julia Santana, female    DOB: [birth year], PCP Rosalee Kaufman, PA-C   Past Medical History:  Diagnosis Date  . Asthma   . CKD (chronic kidney disease) stage 3, GFR 30-59 ml/min (HCC)   . Coronary atherosclerosis of native coronary artery    Multivessel status post CABG 8/13  . Essential hypertension, benign   . Hyperparathyroidism   . Mixed hyperlipidemia   . Seasonal allergies    Past Surgical History:  Procedure Laterality Date  . CORONARY ARTERY BYPASS GRAFT  03/28/2012   Procedure: CORONARY ARTERY BYPASS GRAFTING (CABG);  Surgeon: Ivin Poot, MD;  Location: [facility];  Service: Open Heart Surgery;  Laterality: N/A;  times 3, using Left Internal Mammary and Right Greater Saphenous  Vein Graft harvested endoscopically  . PARATHYROIDECTOMY  12/15/2011   Procedure: PARATHYROIDECTOMY;  Surgeon: Jamesetta So, MD;  Location: [facility];  Service: General;  Laterality: Bilateral;   Social History   Socioeconomic History  . Marital status: Married    Spouse name: SHERMAN  . Number of children: Not on file  . Years of education: Not on file  . Highest education level: Not on file  Occupational History  . Occupation: RETIRED    Employer: RETIRED  Social Needs  . Financial resource strain: Not on file  . Food insecurity:    Worry: Not on file    Inability: Not on file  . Transportation needs:    Medical: Not on file    Non-medical: Not on file  Tobacco Use  . Smoking status: Former Smoker    Packs/day: 1.00    Years: 20.00    Pack years: 20.00    Types: Cigarettes    Last attempt to quit: 08/28/1980    Years since quitting: 38.0  . Smokeless tobacco: Never Used  . Tobacco comment: quit smoking about 25 years ago  Substance and Sexual Activity  . Alcohol use: No  . Drug use: No  . Sexual activity: Not on file  Lifestyle  . Physical  activity:    Days per week: Not on file    Minutes per session: Not on file  . Stress: Not on file  Relationships  . Social connections:    Talks on phone: Not on file    Gets together: Not on file    Attends religious service: Not on file    Active member of club or organization: Not on file    Attends meetings of clubs or organizations: Not on file    Relationship status: Not on file  Other Topics Concern  . Not on file  Social History Narrative  . Not on file   Outpatient Encounter Medications as of 09/12/2018  Medication Sig  . allopurinol (ZYLOPRIM) 100 MG tablet Take 100 mg by mouth daily.  Marland Kitchen aspirin 81 MG tablet Take 81 mg by mouth daily.  Marland Kitchen atorvastatin (LIPITOR) 40 MG tablet Take 1 tablet (40 mg total) by mouth every evening.  . Budesonide-Formoterol Fumarate (SYMBICORT IN) Inhale into the lungs as needed.  . cinacalcet (SENSIPAR) 30 MG tablet Take 30 mg by mouth daily.  Marland Kitchen losartan (  COZAAR) 100 MG tablet Take 100 mg by mouth daily.  . metoprolol succinate (TOPROL-XL) 25 MG 24 hr tablet Take 25 mg by mouth daily.  . pantoprazole (PROTONIX) 40 MG tablet Take 40 mg by mouth 2 (two) times daily.  . [DISCONTINUED] acetaminophen (TYLENOL) 500 MG tablet Take 500-1,000 mg by mouth as needed. For pain  . [DISCONTINUED] bumetanide (BUMEX) 2 MG tablet Take 1 tablet (2 mg total) by mouth daily.  . [DISCONTINUED] omeprazole (PRILOSEC) 20 MG capsule Take 20 mg by mouth daily.   No facility-administered encounter medications on file as of 09/12/2018.    ALLERGIES: Allergies  Allergen Reactions  . Losartan Potassium Other (See Comments)    Unsure of what happened when she took this  . Furosemide Cough    VACCINATION STATUS: Immunization History  Administered Date(s) Administered  . Influenza Whole 04/28/2009  . Influenza,inj,Quad PF,6+ Mos 06/20/2014    HPI Julia Santana is 78 y.o. female who presents today with a medical history as above. she is being seen in consultation  for elevated PTH of 69 on June 03, 2018 requested by Rosalee Kaufman, PA-C.  -Patient with stage III renal insufficiency with secondary hyperparathyroidism, on Sensipar treatment 30 mg p.o. daily. -She denies any prior history of parathyroid dysfunction, however patient is a poor historian.  She has a remote past history of neck surgery which she believes is for partial thyroidectomy.  She is not on any thyroid hormone supplements or replacements. She denies any history of osteoporosis nor nephrolithiasis.  She is not currently on calcium supplements. She denies constipation, diarrhea.  She denies dysphagia, shortness of breath, nor voice change.  She has fluctuating body weight related to her renal insufficiency, no major weight fluctuations lately.  Review of Systems  Constitutional: no weight gain/loss, no fatigue, no subjective hyperthermia, no subjective hypothermia Eyes: no blurry vision, no xerophthalmia ENT: no sore throat, no nodules palpated in throat, no dysphagia/odynophagia, no hoarseness Cardiovascular: no Chest Pain, no Shortness of Breath, no palpitations, no leg swelling Respiratory: no cough, no SOB Gastrointestinal: no Nausea/Vomiting/Diarhhea Musculoskeletal: no muscle/joint aches Skin: no rashes Neurological: no tremors, no numbness, no tingling, no dizziness Psychiatric: no depression, no anxiety  Objective:    BP (!) 143/87   Pulse 63   Ht 5\' 8"  (1.727 m)   Wt 193 lb (87.5 kg)   BMI 29.35 kg/m   Wt Readings from Last 3 Encounters:  09/12/18 193 lb (87.5 kg)  06/11/14 202 lb 8 oz (91.9 kg)  09/01/13 199 lb (90.3 kg)    Physical Exam  Constitutional: + overweight  for height, not in acute distress, normal state of mind Eyes: PERRLA, EOMI, no exophthalmos ENT: moist mucous membranes, + thyromegaly, no cervical lymphadenopathy Cardiovascular: normal precordial activity, Regular Rate and Rhythm, no Murmur/Rubs/Gallops Respiratory:  adequate breathing  efforts, no gross chest deformity, Clear to auscultation bilaterally Gastrointestinal: abdomen soft, Non -tender, No distension, Bowel Sounds present Musculoskeletal: no gross deformities, strength intact in all four extremities Skin: moist, warm, no rashes Neurological: no tremor with outstretched hands, Deep tendon reflexes normal in all four extremities.  CMP ( most recent) CMP     Component Value Date/Time   NA 139 04/03/2012 0405   K 3.4 (L) 04/03/2012 0405   CL 104 04/03/2012 0405   CO2 25 04/03/2012 0405   GLUCOSE 96 04/03/2012 0405   BUN 31 (A) 06/03/2018   CREATININE 2.4 (A) 06/03/2018   CREATININE 1.38 (H) 04/03/2012 0405   CALCIUM  11.1 (H) 04/03/2012 0405   CALCIUM 10.2 03/24/2012 0635   PROT 5.8 (L) 03/31/2012 0356   ALBUMIN 2.9 (L) 03/31/2012 0356   AST 18 03/31/2012 0356   ALT 20 03/31/2012 0356   ALKPHOS 57 03/31/2012 0356   BILITOT 0.6 03/31/2012 0356   GFRNONAA 37 (L) 04/03/2012 0405   GFRAA 43 (L) 04/03/2012 0405     Diabetic Labs (most recent): Lab Results  Component Value Date   HGBA1C 5.9 (H) 03/24/2012     Lipid Panel ( most recent) Lipid Panel     Component Value Date/Time   CHOL 194 03/24/2012 0635   TRIG 134 03/24/2012 0635   HDL 35 (L) 03/24/2012 0635   CHOLHDL 5.5 03/24/2012 0635   VLDL 27 03/24/2012 0635   LDLCALC 132 (H) 03/24/2012 0635   June 03, 2018 labs showed PTH of 69, BUN 31, creatinine 2.42, vitamin D 32.9     Assessment & Plan:   1. Other hyperparathyroidism ([place]) - Julia Santana  is being seen at a kind request of Rosalee Kaufman, PA-C. - I have reviewed her available endocrine records and clinically evaluated the patient. - Based on reviews, she has secondary hyperparathyroidism related to advancing renal insufficiency.  She will not require any additional treatment.  She is advised to continue follow-up with nephrology, currently on Sensipar 30 mg p.o. daily.  - she will need a repeat,  more complete labs  including PTH/calcium, magnesium, phosphorus.    2.  Clinically palpable goiter with prior history of partial thyroidectomy  -She is offered surveillance thyroid ultrasound and thyroid function test.    -she will return in 2 week to review her repeat labs. -I did not initiate any new prescriptions for her today. - I advised her  to maintain close follow up with Rosalee Kaufman, PA-C for primary care needs.   - Time spent with the patient: 35 minutes, of which >50% was spent in obtaining information about her symptoms, reviewing her previous labs, evaluations, and treatments, counseling her about her secondary hyperparathyroidism, clinical goiter, and developing a plan to confirm the diagnosis and long term treatment as necessary.  Julia Santana participated in the discussions, expressed understanding, and voiced agreement with the above plans.  All questions were answered to her satisfaction. she is encouraged to contact clinic should she have any questions or concerns prior to her return visit.  Follow up plan: Return in about 2 weeks (around 09/26/2018) for Follow up with Pre-visit Labs, Thyroid / Neck Ultrasound.   Glade Lloyd, MD [facility] [address] [place],  [address] Phone: [phone]  Fax: [phone]     09/12/2018, 4:56 PM  This note was partially dictated with voice recognition software. Similar sounding words can be transcribed inadequately or may not  be corrected upon review.

## 2018-09-16 DIAGNOSIS — E049 Nontoxic goiter, unspecified: Secondary | ICD-10-CM | POA: Diagnosis not present

## 2018-09-23 ENCOUNTER — Telehealth: Payer: Self-pay | Admitting: "Endocrinology

## 2018-09-23 NOTE — Telephone Encounter (Signed)
FYI  ---- Message ----- From: Lenore Cordia Sent: 09/19/2018   9:40 AM EST To: April H Pait, Janalyn Harder [place], LPN Subject: RE: Please sched                               Patient has been called 3Xs she says her name on the vox mail so the number is correct.[phone], patient is not returning the call .     ----- Message ----- From: Pait, April H Sent: 09/13/2018  10:43 AM EST To: Lenore Cordia Subject: FW: Please sched

## 2018-09-30 ENCOUNTER — Telehealth: Payer: Self-pay | Admitting: "Endocrinology

## 2018-09-30 NOTE — Telephone Encounter (Signed)
Left message for pt to cancel appt for 10/01/2018. She has not had ultrasound done.   See previous messge from central scheduling.

## 2018-10-01 ENCOUNTER — Ambulatory Visit: Payer: Medicare HMO | Admitting: "Endocrinology

## 2018-10-02 ENCOUNTER — Telehealth: Payer: Self-pay | Admitting: "Endocrinology

## 2018-10-02 NOTE — Telephone Encounter (Signed)
Left message for pt to contact our office. She needs to reschedule OV, have labs done prior to visit and also need to schedule ultrasound. Cental scheduling has left pt several messages to schedule this with no return call from pt.

## 2019-01-01 DIAGNOSIS — K219 Gastro-esophageal reflux disease without esophagitis: Secondary | ICD-10-CM | POA: Diagnosis not present

## 2019-01-01 DIAGNOSIS — E213 Hyperparathyroidism, unspecified: Secondary | ICD-10-CM | POA: Diagnosis not present

## 2019-01-01 DIAGNOSIS — Z6831 Body mass index (BMI) 31.0-31.9, adult: Secondary | ICD-10-CM | POA: Diagnosis not present

## 2019-01-01 DIAGNOSIS — I5031 Acute diastolic (congestive) heart failure: Secondary | ICD-10-CM | POA: Diagnosis not present

## 2019-01-01 DIAGNOSIS — I10 Essential (primary) hypertension: Secondary | ICD-10-CM | POA: Diagnosis not present

## 2019-01-01 DIAGNOSIS — N184 Chronic kidney disease, stage 4 (severe): Secondary | ICD-10-CM | POA: Diagnosis not present

## 2019-01-01 DIAGNOSIS — I130 Hypertensive heart and chronic kidney disease with heart failure and stage 1 through stage 4 chronic kidney disease, or unspecified chronic kidney disease: Secondary | ICD-10-CM | POA: Diagnosis not present

## 2019-01-01 DIAGNOSIS — E049 Nontoxic goiter, unspecified: Secondary | ICD-10-CM | POA: Diagnosis not present

## 2019-01-01 DIAGNOSIS — E782 Mixed hyperlipidemia: Secondary | ICD-10-CM | POA: Diagnosis not present

## 2019-01-01 DIAGNOSIS — M1000 Idiopathic gout, unspecified site: Secondary | ICD-10-CM | POA: Diagnosis not present

## 2019-01-07 DIAGNOSIS — E049 Nontoxic goiter, unspecified: Secondary | ICD-10-CM | POA: Diagnosis not present

## 2019-01-07 DIAGNOSIS — E041 Nontoxic single thyroid nodule: Secondary | ICD-10-CM | POA: Diagnosis not present

## 2019-02-13 ENCOUNTER — Other Ambulatory Visit (HOSPITAL_COMMUNITY): Payer: Self-pay | Admitting: Otolaryngology

## 2019-02-13 ENCOUNTER — Ambulatory Visit (INDEPENDENT_AMBULATORY_CARE_PROVIDER_SITE_OTHER): Payer: Medicare HMO | Admitting: Otolaryngology

## 2019-02-13 DIAGNOSIS — E041 Nontoxic single thyroid nodule: Secondary | ICD-10-CM

## 2019-02-13 DIAGNOSIS — D440 Neoplasm of uncertain behavior of thyroid gland: Secondary | ICD-10-CM

## 2019-02-26 ENCOUNTER — Ambulatory Visit (HOSPITAL_COMMUNITY)
Admission: RE | Admit: 2019-02-26 | Discharge: 2019-02-26 | Disposition: A | Discharge status: Home / Self Care | Payer: Medicare HMO | Source: Ambulatory Visit | Attending: Otolaryngology | Admitting: Otolaryngology

## 2019-02-26 ENCOUNTER — Encounter (HOSPITAL_COMMUNITY): Payer: Self-pay

## 2019-02-26 ENCOUNTER — Other Ambulatory Visit: Payer: Self-pay

## 2019-02-26 DIAGNOSIS — E041 Nontoxic single thyroid nodule: Secondary | ICD-10-CM | POA: Diagnosis not present

## 2019-02-26 MED ORDER — LIDOCAINE HCL (PF) 2 % IJ SOLN
INTRAMUSCULAR | Status: AC
  Filled 2019-02-26: qty 10

## 2019-02-26 NOTE — Progress Notes (Signed)
Biopsy complete no signs of distress. BP decreased to 185/80 ok to discharge per PA Turpin.

## 2019-02-26 NOTE — Progress Notes (Signed)
Pt blood pressure eleveated 213/85 PA aware and proceeding with biopsy.

## 2019-03-07 DIAGNOSIS — E041 Nontoxic single thyroid nodule: Secondary | ICD-10-CM | POA: Diagnosis not present

## 2019-03-18 ENCOUNTER — Encounter (HOSPITAL_COMMUNITY): Payer: Self-pay

## 2019-03-28 DIAGNOSIS — I10 Essential (primary) hypertension: Secondary | ICD-10-CM | POA: Diagnosis not present

## 2019-03-28 DIAGNOSIS — N184 Chronic kidney disease, stage 4 (severe): Secondary | ICD-10-CM | POA: Diagnosis not present

## 2019-04-03 ENCOUNTER — Ambulatory Visit (INDEPENDENT_AMBULATORY_CARE_PROVIDER_SITE_OTHER): Payer: Medicare HMO | Admitting: Otolaryngology

## 2019-04-03 DIAGNOSIS — D440 Neoplasm of uncertain behavior of thyroid gland: Secondary | ICD-10-CM | POA: Diagnosis not present

## 2019-04-03 DIAGNOSIS — R1312 Dysphagia, oropharyngeal phase: Secondary | ICD-10-CM

## 2019-04-16 DIAGNOSIS — Z1331 Encounter for screening for depression: Secondary | ICD-10-CM | POA: Diagnosis not present

## 2019-04-16 DIAGNOSIS — E049 Nontoxic goiter, unspecified: Secondary | ICD-10-CM | POA: Diagnosis not present

## 2019-04-16 DIAGNOSIS — I10 Essential (primary) hypertension: Secondary | ICD-10-CM | POA: Diagnosis not present

## 2019-04-16 DIAGNOSIS — K219 Gastro-esophageal reflux disease without esophagitis: Secondary | ICD-10-CM | POA: Diagnosis not present

## 2019-04-16 DIAGNOSIS — N184 Chronic kidney disease, stage 4 (severe): Secondary | ICD-10-CM | POA: Diagnosis not present

## 2019-04-16 DIAGNOSIS — E782 Mixed hyperlipidemia: Secondary | ICD-10-CM | POA: Diagnosis not present

## 2019-04-16 DIAGNOSIS — Z1389 Encounter for screening for other disorder: Secondary | ICD-10-CM | POA: Diagnosis not present

## 2019-04-16 DIAGNOSIS — Z6830 Body mass index (BMI) 30.0-30.9, adult: Secondary | ICD-10-CM | POA: Diagnosis not present

## 2019-04-16 DIAGNOSIS — M1000 Idiopathic gout, unspecified site: Secondary | ICD-10-CM | POA: Diagnosis not present

## 2019-04-16 DIAGNOSIS — I5031 Acute diastolic (congestive) heart failure: Secondary | ICD-10-CM | POA: Diagnosis not present

## 2019-04-16 DIAGNOSIS — E213 Hyperparathyroidism, unspecified: Secondary | ICD-10-CM | POA: Diagnosis not present

## 2019-04-28 DIAGNOSIS — N184 Chronic kidney disease, stage 4 (severe): Secondary | ICD-10-CM | POA: Diagnosis not present

## 2019-04-28 DIAGNOSIS — I10 Essential (primary) hypertension: Secondary | ICD-10-CM | POA: Diagnosis not present

## 2019-05-26 ENCOUNTER — Other Ambulatory Visit: Payer: Self-pay | Admitting: Otolaryngology

## 2019-05-27 ENCOUNTER — Other Ambulatory Visit (HOSPITAL_COMMUNITY)
Admission: RE | Admit: 2019-05-27 | Discharge: 2019-05-27 | Disposition: A | Discharge status: Home / Self Care | Payer: Medicare HMO | Source: Ambulatory Visit | Attending: Otolaryngology | Admitting: Otolaryngology

## 2019-05-27 NOTE — Progress Notes (Addendum)
Attempted to reach out to patient due to missed covid testing appointment but the mailbox is full and I am unable to leave a message  8:25 AM 05/28/19  Attempted to recall patient, still unable to leave a message Attempted husband number as well, no way to leave a message

## 2019-05-30 ENCOUNTER — Ambulatory Visit (HOSPITAL_BASED_OUTPATIENT_CLINIC_OR_DEPARTMENT_OTHER): Admission: RE | Admit: 2019-05-30 | Payer: Medicare HMO | Source: Home / Self Care | Admitting: Otolaryngology

## 2019-05-30 ENCOUNTER — Encounter (HOSPITAL_BASED_OUTPATIENT_CLINIC_OR_DEPARTMENT_OTHER): Admission: RE | Payer: Self-pay | Source: Home / Self Care

## 2019-05-30 SURGERY — THYROIDECTOMY
Anesthesia: General | Laterality: Right

## 2019-08-11 DIAGNOSIS — I10 Essential (primary) hypertension: Secondary | ICD-10-CM | POA: Diagnosis not present

## 2019-08-11 DIAGNOSIS — M1000 Idiopathic gout, unspecified site: Secondary | ICD-10-CM | POA: Diagnosis not present

## 2019-08-11 DIAGNOSIS — Z23 Encounter for immunization: Secondary | ICD-10-CM | POA: Diagnosis not present

## 2019-08-11 DIAGNOSIS — N184 Chronic kidney disease, stage 4 (severe): Secondary | ICD-10-CM | POA: Diagnosis not present

## 2019-08-11 DIAGNOSIS — Z0001 Encounter for general adult medical examination with abnormal findings: Secondary | ICD-10-CM | POA: Diagnosis not present

## 2019-08-11 DIAGNOSIS — I5031 Acute diastolic (congestive) heart failure: Secondary | ICD-10-CM | POA: Diagnosis not present

## 2019-08-11 DIAGNOSIS — R079 Chest pain, unspecified: Secondary | ICD-10-CM | POA: Diagnosis not present

## 2019-08-11 DIAGNOSIS — E049 Nontoxic goiter, unspecified: Secondary | ICD-10-CM | POA: Diagnosis not present

## 2019-08-12 ENCOUNTER — Telehealth: Payer: Self-pay

## 2019-08-12 NOTE — Telephone Encounter (Signed)
NOTES ON FILE FROM [facility] [phone] , SENT REFERRAL TO SCHEDULING

## 2019-08-15 ENCOUNTER — Telehealth: Payer: Self-pay

## 2019-08-15 NOTE — Telephone Encounter (Signed)
FAXED NOTES TO [facility] 

## 2019-09-10 ENCOUNTER — Encounter: Payer: Self-pay | Admitting: *Deleted

## 2019-09-11 ENCOUNTER — Telehealth: Payer: Self-pay | Admitting: Cardiology

## 2019-09-11 ENCOUNTER — Other Ambulatory Visit: Payer: Self-pay

## 2019-09-11 ENCOUNTER — Ambulatory Visit: Payer: Medicare HMO | Admitting: Cardiology

## 2019-09-11 ENCOUNTER — Encounter: Payer: Self-pay | Admitting: *Deleted

## 2019-09-11 ENCOUNTER — Encounter: Payer: Self-pay | Admitting: Cardiology

## 2019-09-11 VITALS — BP 190/82 | HR 67 | Ht 65.0 in | Wt 188.0 lb

## 2019-09-11 DIAGNOSIS — N184 Chronic kidney disease, stage 4 (severe): Secondary | ICD-10-CM | POA: Diagnosis not present

## 2019-09-11 DIAGNOSIS — E782 Mixed hyperlipidemia: Secondary | ICD-10-CM

## 2019-09-11 DIAGNOSIS — I25119 Atherosclerotic heart disease of native coronary artery with unspecified angina pectoris: Secondary | ICD-10-CM | POA: Diagnosis not present

## 2019-09-11 DIAGNOSIS — I10 Essential (primary) hypertension: Secondary | ICD-10-CM

## 2019-09-11 MED ORDER — AMLODIPINE BESYLATE 5 MG PO TABS: 5.0000 mg | ORAL_TABLET | Freq: Every day | ORAL | 3 refills | Status: DC

## 2019-09-11 NOTE — Progress Notes (Signed)
Cardiology Office Note  Date: 09/11/2019   ID: Julia Santana, DOB 10/08/1940, MRN [id number]  PCP:  Rosalee Kaufman, PA-C  Consulting Cardiologist:  Rozann Lesches, MD Electrophysiologist:  None   Chief Complaint  Patient presents with  . CAD and history of chest pain    History of Present Illness: Julia Santana is a 79 y.o. female referred for cardiology consultation by Ms. Skillman PA-C for the evaluation of chest pain and abnormal ECG.  Office notes from December 2020 reviewed.  She was previously followed by our practice, not seen since 2015.  She tells me that she has been experiencing intermittent chest pain for 2 years.  She describes reflux symptoms, and also an exertional discomfort that resolves within a few minutes when she rests.  She has not had nitroglycerin until prescribed recently by PCP, although has not used it.  She states that she has been taking her medications regularly.  She has CKD stage IV, currently not following with a nephrologist.  Her last creatinine was 2.23 in August 2020.  I personally reviewed her ECG obtained in December 2020 by PCP which shows sinus rhythm with left atrial enlargement, poor anterior R wave progression rule out old anterior infarct pattern, nonspecific T wave changes.  Past Medical History:  Diagnosis Date  . Anemia of chronic disease   . Asthma   . CKD (chronic kidney disease) stage 4, GFR 15-29 ml/min (HCC)   . Coronary atherosclerosis of native coronary artery    Multivessel status post CABG 8/13  . Essential hypertension   . GERD (gastroesophageal reflux disease)   . Gout   . Hyperparathyroidism ([place])   . Mixed hyperlipidemia   . Seasonal allergies     Past Surgical History:  Procedure Laterality Date  . CORONARY ARTERY BYPASS GRAFT  03/28/2012   Procedure: CORONARY ARTERY BYPASS GRAFTING (CABG);  Surgeon: Ivin Poot, MD;  Location: [place];  Service: Open Heart Surgery;  Laterality: N/A;  times 3, using  Left Internal Mammary and Right Greater Saphenous  Vein Graft harvested endoscopically  . PARATHYROIDECTOMY  12/15/2011   Procedure: PARATHYROIDECTOMY;  Surgeon: Jamesetta So, MD;  Location: [facility];  Service: General;  Laterality: Bilateral;    Current Outpatient Medications  Medication Sig Dispense Refill  . allopurinol (ZYLOPRIM) 100 MG tablet Take 100 mg by mouth daily.    Marland Kitchen aspirin 81 MG tablet Take 81 mg by mouth daily.    Marland Kitchen atorvastatin (LIPITOR) 40 MG tablet Take 1 tablet (40 mg total) by mouth every evening. 30 tablet 11  . losartan (COZAAR) 100 MG tablet Take 100 mg by mouth daily.    . metoprolol succinate (TOPROL-XL) 50 MG 24 hr tablet Take 50 mg by mouth daily. Take with or immediately following a meal.    . pantoprazole (PROTONIX) 40 MG tablet Take 40 mg by mouth 2 (two) times daily.    Marland Kitchen torsemide (DEMADEX) 100 MG tablet Take 100 mg by mouth daily as needed.    Marland Kitchen amLODipine (NORVASC) 5 MG tablet Take 1 tablet (5 mg total) by mouth daily. 90 tablet 3   No current facility-administered medications for this visit.   Allergies:  Cortisone and Furosemide   Social History: The patient  reports that she quit smoking about 39 years ago. Her smoking use included cigarettes. She has a 20.00 pack-year smoking history. She has never used smokeless tobacco. She reports that she does not drink alcohol or use drugs.  Family History: The patient's family history includes Diabetes type II in her brother, mother, and sister; Hypertension in her mother.    ROS:  Please see the history of present illness. Otherwise, complete review of systems is positive for none.  All other systems are reviewed and negative.   Physical Exam: VS:  BP (!) 190/82   Pulse 67   Ht 5\' 5"  (1.651 m)   Wt 188 lb (85.3 kg)   SpO2 98%   BMI 31.28 kg/m , BMI Body mass index is 31.28 kg/m.  Wt Readings from Last 3 Encounters:  09/11/19 188 lb (85.3 kg)  09/12/18 193 lb (87.5 kg)  06/11/14 202 lb 8 oz (91.9 kg)     General: Patient appears comfortable at rest. HEENT: Conjunctiva and lids normal, wearing a mask. Neck: Supple, no elevated JVP or carotid bruits, no thyromegaly. Lungs: Clear to auscultation, nonlabored breathing at rest. Cardiac: Regular rate and rhythm, no S3, soft systolic murmur, no pericardial rub. Abdomen: Soft, nontender, bowel sounds present. Extremities: Chronic appearing lower leg edema/lymphedema with compression stockings in place, distal pulses 1-2+. Skin: Warm and dry. Musculoskeletal: No kyphosis. Neuropsychiatric: Alert and oriented x3, affect grossly appropriate.  ECG:  An ECG dated 06/11/2014 was personally reviewed today and demonstrated:  Sinus rhythm with probable PAC, poor anterior R wave progression rule out old anterior infarct pattern.  Recent Labwork:  August 2020: Cholesterol 159, triglycerides 95, HDL 41, LDL 99, hemoglobin 10.2, platelets 186, BUN 27, creatinine 2.23, potassium 4.2, AST 15, ALT 9  Other Studies Reviewed Today:  Echocardiogram 03/25/2012: Study Conclusions   - Left ventricle: The cavity size was normal. Wall thickness  was increased in a pattern of mild LVH. There was mild  focal basal hypertrophy of the septum. Systolic function  was normal. The estimated ejection fraction was in the  range of 55% to 60%. Wall motion was normal; there were no  regional wall motion abnormalities. Doppler parameters are  consistent with abnormal left ventricular relaxation  (grade 1 diastolic dysfunction).  - Mitral valve: Mild regurgitation.  - Left atrium: The atrium was mildly dilated.   Assessment and Plan:  1.  Multivessel CAD status post CABG in 2013.  She reports 2-year history of intermittent chest pain as described above, some of which is consistent with angina.  Suspect she has manifested graft disease over time.  In the setting of CKD stage IV, we will attempt adjustments in medications and also follow-up with a Lexiscan  Myoview to evaluate ischemic burden and echocardiogram for cardiac structural assessment (ECG suggests old anterior infarct potentially).  Start Norvasc 5 mg daily, otherwise continue aspirin, Lipitor, losartan, and Toprol-XL.  Office follow-up to be arranged.  2.  CKD stage IV, not following with nephrology at this time.  Last creatinine 2.23.  3.  Mixed hyperlipidemia, she reports compliance with Lipitor.  Last LDL was 99.  Can discuss further intensification of therapy over time.  4.  Essential hypertension, blood pressure not well controlled today.  We are adding Norvasc as an antianginal and antihypertensive.  Medication Adjustments/Labs and Tests Ordered: Current medicines are reviewed at length with the patient today.  Concerns regarding medicines are outlined above.   Tests Ordered: Orders Placed This Encounter  Procedures  . NM Myocar Multi W/Spect W/Wall Motion / EF  . ECHOCARDIOGRAM COMPLETE    Medication Changes: Meds ordered this encounter  Medications  . amLODipine (NORVASC) 5 MG tablet    Sig: Take 1 tablet (5 mg total)  by mouth daily.    Dispense:  90 tablet    Refill:  3    09/11/2019 NEW    Disposition:  Follow up 6 weeks in the [place] office.  Signed, Satira Sark, MD, [facility] 09/11/2019 10:48 AM    [facility] [place], [place], [place] [address] Phone: [phone]; Fax: [phone]

## 2019-09-11 NOTE — Patient Instructions (Addendum)
Medication Instructions:   Your physician has recommended you make the following change in your medication:  Start amlodipine 5 mg by mouth daily  Continue other medications the same  Labwork:  NONE  Testing/Procedures: Your physician has requested that you have an echocardiogram. Echocardiography is a painless test that uses sound waves to create images of your heart. It provides your doctor with information about the size and shape of your heart and how well your heart's chambers and valves are working. This procedure takes approximately one hour. There are no restrictions for this procedure. Your physician has requested that you have a lexiscan myoview. For further information please visit [email or web address]. Please follow instruction sheet, as given.  Follow-Up:  Your physician recommends that you schedule a follow-up appointment in: 6 weeks (office).  Any Other Special Instructions Will Be Listed Below (If Applicable).  If you need a refill on your cardiac medications before your next appointment, please call your pharmacy.

## 2019-09-11 NOTE — Telephone Encounter (Signed)
Pre-cert Verification for the following procedure    Lexiscan scheduled for 09/22/2019 at [facility]

## 2019-09-17 ENCOUNTER — Other Ambulatory Visit: Payer: Self-pay

## 2019-09-17 ENCOUNTER — Ambulatory Visit (INDEPENDENT_AMBULATORY_CARE_PROVIDER_SITE_OTHER): Payer: Medicare HMO

## 2019-09-17 DIAGNOSIS — I25119 Atherosclerotic heart disease of native coronary artery with unspecified angina pectoris: Secondary | ICD-10-CM

## 2019-09-18 ENCOUNTER — Telehealth: Payer: Self-pay | Admitting: *Deleted

## 2019-09-18 NOTE — Telephone Encounter (Signed)
-----   Message from Satira Sark, MD sent at 09/17/2019 10:01 AM EST ----- Results reviewed.  LVEF is normal at 65 to 70% and there are no wall motion abnormalities to suggest prior infarct scar.  Continue with current follow-up plan and testing.

## 2019-09-18 NOTE — Telephone Encounter (Signed)
Pt voiced understanding- stress test 1/25

## 2019-09-22 ENCOUNTER — Other Ambulatory Visit: Payer: Self-pay

## 2019-09-22 ENCOUNTER — Ambulatory Visit (HOSPITAL_COMMUNITY)
Admission: RE | Admit: 2019-09-22 | Discharge: 2019-09-22 | Disposition: A | Discharge status: Home / Self Care | Payer: Medicare HMO | Source: Ambulatory Visit | Attending: Cardiology | Admitting: Cardiology

## 2019-09-22 ENCOUNTER — Encounter (HOSPITAL_COMMUNITY)
Admission: RE | Admit: 2019-09-22 | Discharge: 2019-09-22 | Disposition: A | Discharge status: Home / Self Care | Payer: Medicare HMO | Source: Ambulatory Visit | Attending: Cardiology | Admitting: Cardiology

## 2019-09-22 DIAGNOSIS — I25119 Atherosclerotic heart disease of native coronary artery with unspecified angina pectoris: Secondary | ICD-10-CM | POA: Insufficient documentation

## 2019-09-22 LAB — NM MYOCAR MULTI W/SPECT W/WALL MOTION / EF
LV dias vol: 74 mL (ref 46–106)
LV sys vol: 26 mL
Peak HR: 77 {beats}/min
RATE: 0.71
Rest HR: 66 {beats}/min
SDS: 1
SRS: 1
SSS: 2
TID: 1.89

## 2019-09-22 MED ORDER — TECHNETIUM TC 99M TETROFOSMIN IV KIT
10.0000 | PACK | Freq: Once | INTRAVENOUS | Status: AC | PRN
  Administered 2019-09-22: 10 via INTRAVENOUS

## 2019-09-22 MED ORDER — REGADENOSON 0.4 MG/5ML IV SOLN
INTRAVENOUS | Status: AC
  Administered 2019-09-22: 0.4 mg via INTRAVENOUS
  Filled 2019-09-22: qty 5

## 2019-09-22 MED ORDER — TECHNETIUM TC 99M TETROFOSMIN IV KIT
30.0000 | PACK | Freq: Once | INTRAVENOUS | Status: AC | PRN
  Administered 2019-09-22: 31 via INTRAVENOUS

## 2019-09-22 MED ORDER — SODIUM CHLORIDE FLUSH 0.9 % IV SOLN
INTRAVENOUS | Status: AC
  Administered 2019-09-22: 10 mL via INTRAVENOUS
  Filled 2019-09-22: qty 10

## 2019-09-23 ENCOUNTER — Telehealth: Payer: Self-pay | Admitting: *Deleted

## 2019-09-23 NOTE — Telephone Encounter (Signed)
-----   Message from Satira Sark, MD sent at 09/22/2019  3:52 PM EST ----- Results reviewed.  Stress test overall low risk with minor apical anteroseptal ischemic territory versus soft tissue attenuation.  We will continue with medical therapy as noted in the recent office visit.

## 2019-09-23 NOTE — Telephone Encounter (Signed)
Patient informed and copy sent to PCP. 

## 2019-10-09 ENCOUNTER — Ambulatory Visit: Payer: Medicare HMO | Attending: Internal Medicine

## 2019-10-09 DIAGNOSIS — Z23 Encounter for immunization: Secondary | ICD-10-CM | POA: Insufficient documentation

## 2019-10-09 NOTE — Progress Notes (Signed)
   Covid-19 Vaccination Clinic  Name:  Julia Santana    MRN: [id number] DOB: [birth year]  10/09/2019  [name] was observed post Covid-19 immunization for 15 minutes without incidence. She was provided with Vaccine Information Sheet and instruction to access the V-Safe system.   [name] was instructed to call 911 with any severe reactions post vaccine: Marland Kitchen Difficulty breathing  . Swelling of your face and throat  . A fast heartbeat  . A bad rash all over your body  . Dizziness and weakness    Immunizations Administered    Name Date Dose VIS Date Route   Moderna COVID-19 Vaccine 10/09/2019 10:16 AM 0.5 mL 07/29/2019 Intramuscular   Manufacturer: Moderna   Lot: [id number]   [place]: [id number]

## 2019-10-16 ENCOUNTER — Telehealth: Payer: Self-pay | Admitting: Cardiology

## 2019-10-16 NOTE — Telephone Encounter (Signed)

## 2019-10-17 ENCOUNTER — Telehealth (INDEPENDENT_AMBULATORY_CARE_PROVIDER_SITE_OTHER): Payer: Medicare HMO | Admitting: Cardiology

## 2019-10-17 ENCOUNTER — Encounter: Payer: Self-pay | Admitting: Cardiology

## 2019-10-17 ENCOUNTER — Telehealth: Payer: Self-pay | Admitting: Licensed Clinical Social Worker

## 2019-10-17 VITALS — Ht 67.0 in | Wt 182.0 lb

## 2019-10-17 DIAGNOSIS — N184 Chronic kidney disease, stage 4 (severe): Secondary | ICD-10-CM | POA: Diagnosis not present

## 2019-10-17 DIAGNOSIS — I10 Essential (primary) hypertension: Secondary | ICD-10-CM

## 2019-10-17 DIAGNOSIS — I25119 Atherosclerotic heart disease of native coronary artery with unspecified angina pectoris: Secondary | ICD-10-CM | POA: Diagnosis not present

## 2019-10-17 DIAGNOSIS — E782 Mixed hyperlipidemia: Secondary | ICD-10-CM

## 2019-10-17 NOTE — Telephone Encounter (Signed)
CSW referred to assist patient with obtaining a scale and BP cuff. CSW contacted patient to inform scale and cuff will be delivered to home. Patient grateful for support and assistance. CSW available as needed. Jackie Zalia Hautala, LCSW, CCSW-MCS [phone]  

## 2019-10-17 NOTE — Progress Notes (Signed)
Virtual Visit via Telephone Note   This visit type was conducted due to national recommendations for restrictions regarding the COVID-19 Pandemic (e.g. social distancing) in an effort to limit this patient's exposure and mitigate transmission in our community.  Due to her co-morbid illnesses, this patient is at least at moderate risk for complications without adequate follow up.  This format is felt to be most appropriate for this patient at this time.  The patient did not have access to video technology/had technical difficulties with video requiring transitioning to audio format only (telephone).  All issues noted in this document were discussed and addressed.  No physical exam could be performed with this format.  Please refer to the patient's chart for her  consent to telehealth for [facility].   Date:  10/17/2019   ID:  Julia Santana, DOB 02-23-1941, MRN [id number]  Patient Location: Home Provider Location: Office  PCP:  Rosalee Kaufman, PA-C  Cardiologist:  Rozann Lesches, MD Electrophysiologist:  None   Evaluation Performed:  Follow-Up Visit  Chief Complaint:  Cardiac follow-up  History of Present Illness:    Julia Santana is a 79 y.o. female last seen in January.  We spoke by phone today.  Since addition of Norvasc at the last visit, she has had a few episodes of chest discomfort, but not complete resolution.  She has not had to use any nitroglycerin.  Recent follow-up echocardiogram showed LVEF 65 to 70% with mild diastolic dysfunction and no wall motion abnormalities mild mitral regurgitation.  Lexiscan Myoview was overall low risk showing a small region of apical anteroseptal ischemia versus soft tissue attenuation.  We discussed the results today and will plan to continue medical therapy for now.  The patient does not have symptoms concerning for COVID-19 infection (fever, chills, cough, or new shortness of breath).  She got her first dose of vaccine  recently.   Past Medical History:  Diagnosis Date  . Anemia of chronic disease   . Asthma   . CKD (chronic kidney disease) stage 4, GFR 15-29 ml/min (HCC)   . Coronary atherosclerosis of native coronary artery    Multivessel status post CABG 8/13  . Essential hypertension   . GERD (gastroesophageal reflux disease)   . Gout   . Hyperparathyroidism ([place])   . Mixed hyperlipidemia   . Seasonal allergies    Past Surgical History:  Procedure Laterality Date  . CORONARY ARTERY BYPASS GRAFT  03/28/2012   Procedure: CORONARY ARTERY BYPASS GRAFTING (CABG);  Surgeon: Ivin Poot, MD;  Location: [place];  Service: Open Heart Surgery;  Laterality: N/A;  times 3, using Left Internal Mammary and Right Greater Saphenous  Vein Graft harvested endoscopically  . PARATHYROIDECTOMY  12/15/2011   Procedure: PARATHYROIDECTOMY;  Surgeon: Jamesetta So, MD;  Location: [facility];  Service: General;  Laterality: Bilateral;     Current Meds  Medication Sig  . allopurinol (ZYLOPRIM) 100 MG tablet Take 100 mg by mouth daily.  Marland Kitchen amLODipine (NORVASC) 5 MG tablet Take 1 tablet (5 mg total) by mouth daily.  Marland Kitchen aspirin 81 MG tablet Take 81 mg by mouth daily.  Marland Kitchen atorvastatin (LIPITOR) 40 MG tablet Take 1 tablet (40 mg total) by mouth every evening.  Marland Kitchen losartan (COZAAR) 100 MG tablet Take 100 mg by mouth daily.  . metoprolol succinate (TOPROL-XL) 50 MG 24 hr tablet Take 50 mg by mouth daily. Take with or immediately following a meal.  . pantoprazole (PROTONIX) 40 MG tablet Take  40 mg by mouth 2 (two) times daily.  Marland Kitchen torsemide (DEMADEX) 100 MG tablet Take 100 mg by mouth daily as needed.  . [DISCONTINUED] pantoprazole (PROTONIX) 40 MG tablet Take 40 mg by mouth 2 (two) times daily.     Allergies:   Cortisone and Furosemide   ROS:  No palpitations or syncope.   Prior CV studies:   The following studies were reviewed today:  Echocardiogram 09/17/2019: 1. Left ventricular ejection fraction, by visual estimation,  is 65 to  70%. The left ventricle has normal function. There is borderline left  ventricular hypertrophy.  2. Left ventricular diastolic parameters are consistent with Grade I  diastolic dysfunction (impaired relaxation).  3. The left ventricle has no regional wall motion abnormalities.  4. Global right ventricle has normal systolic function.The right  ventricular size is normal. No increase in right ventricular wall  thickness.  5. Left atrial size was upper normal.  6. Right atrial size was normal.  7. The mitral valve is grossly normal. Mild mitral valve regurgitation.  8. The tricuspid valve is grossly normal.  9. The tricuspid valve is grossly normal. Tricuspid valve regurgitation  is trivial.  10. The aortic valve is tricuspid. Aortic valve regurgitation is not  visualized.  11. The pulmonic valve was grossly normal. Pulmonic valve regurgitation is  not visualized.  12. Normal pulmonary artery systolic pressure.  13. The tricuspid regurgitant velocity is 1.93 m/s, and with an assumed  right atrial pressure of 3 mmHg, the estimated right ventricular systolic  pressure is normal at 17.9 mmHg.  14. The inferior vena cava is normal in size with greater than 50%  respiratory variability, suggesting right atrial pressure of 3 mmHg.   Lexiscan Myoview 09/22/2019:  No diagnostic ST segment changes to indicate ischemia.  Small, mild intensity, apical anteroseptal defect that is partially reversible. Suggestive of variable soft tissue attenuation versus minor ischemic territory.  This is a low risk study.  Nuclear stress EF: 65%.  Labs/Other Tests and Data Reviewed:    EKG:  An ECG dated December 2020 was personally reviewed today and demonstrated:  Sinus rhythm with left atrial enlargement, poor R wave progression anteriorly rule out old anterior infarct pattern, nonspecific T wave changes.  Recent Labs:  August 2020: Cholesterol 159, triglycerides 95, HDL 41, LDL 99,  hemoglobin 10.2, platelets 186, BUN 27, creatinine 2.23, potassium 4.2, AST 15, ALT 9  Wt Readings from Last 3 Encounters:  10/17/19 182 lb (82.6 kg)  09/11/19 188 lb (85.3 kg)  09/12/18 193 lb (87.5 kg)     Objective:    Vital Signs:  Ht 5\' 7"  (1.702 m)   Wt 182 lb (82.6 kg)   BMI 28.51 kg/m    She did not have a way to check vital signs today. Patient spoke in full sentences, not short of breath. No audible wheezing or coughing. Speech pattern normal.  ASSESSMENT & PLAN:    1.  Multivessel CAD status post CABG in 2013.  She does report exertional angina symptoms, we will plan to continue with medical therapy for now based on recent cardiac testing, no large ischemic territories by Myoview, and LVEF normal.  She is tolerating the recent addition of Norvasc which could be further uptitrated, or we could add Imdur as a next step.  Otherwise continue aspirin, Lipitor, losartan, and Toprol-XL.  2.  CKD stage IV, last creatinine 2.23.  3.  Essential hypertension, continue to follow blood pressure trend.  Currently on Norvasc, losartan, and  Toprol-XL.  Time:   Today, I have spent 6 minutes with the patient with telehealth technology discussing the above problems.     Medication Adjustments/Labs and Tests Ordered: Current medicines are reviewed at length with the patient today.  Concerns regarding medicines are outlined above.   Tests Ordered: No orders of the defined types were placed in this encounter.   Medication Changes: No orders of the defined types were placed in this encounter.   Follow Up:  In Person 3 months in the [place] office.  Signed, Rozann Lesches, MD  10/17/2019 1:26 PM    [place]

## 2019-10-17 NOTE — Patient Instructions (Signed)
Medication Instructions:  Your physician recommends that you continue on your current medications as directed. Please refer to the Current Medication list given to you today.  *If you need a refill on your cardiac medications before your next appointment, please call your pharmacy*  Lab Work: None today If you have labs (blood work) drawn today and your tests are completely normal, you will receive your results only by: Marland Kitchen MyChart Message (if you have MyChart) OR . A paper copy in the mail If you have any lab test that is abnormal or we need to change your treatment, we will call you to review the results.  Testing/Procedures: None today  Follow-Up: At [facility], you and your health needs are our priority.  As part of our continuing mission to provide you with exceptional heart care, we have created designated Provider Care Teams.  These Care Teams include your primary Cardiologist (physician) and Advanced Practice Providers (APPs -  Physician Assistants and Nurse Practitioners) who all work together to provide you with the care you need, when you need it.  Your next appointment:   3 month(s)  The format for your next appointment:   In Person  Provider:   Rozann Lesches, MD  Other Instructions None      Thank you for choosing [facility] !

## 2019-11-04 DIAGNOSIS — H2513 Age-related nuclear cataract, bilateral: Secondary | ICD-10-CM | POA: Diagnosis not present

## 2019-11-10 ENCOUNTER — Ambulatory Visit: Payer: Medicare HMO | Attending: Internal Medicine

## 2019-11-10 DIAGNOSIS — Z23 Encounter for immunization: Secondary | ICD-10-CM

## 2019-11-10 NOTE — Progress Notes (Signed)
   Covid-19 Vaccination Clinic  Name:  Julia Santana    MRN: [id number] DOB: [birth year]  11/10/2019  Julia Santana was observed post Covid-19 immunization for 15 minutes without incident. She was provided with Vaccine Information Sheet and instruction to access the V-Safe system.   Julia Santana was instructed to call 911 with any severe reactions post vaccine: Marland Kitchen Difficulty breathing  . Swelling of face and throat  . A fast heartbeat  . A bad rash all over body  . Dizziness and weakness   Immunizations Administered    Name Date Dose VIS Date Route   Moderna COVID-19 Vaccine 11/10/2019  9:55 AM 0.5 mL 07/29/2019 Intramuscular   Manufacturer: Moderna   Lot: [id number]   [place]: [id number]

## 2019-12-10 DIAGNOSIS — E049 Nontoxic goiter, unspecified: Secondary | ICD-10-CM | POA: Diagnosis not present

## 2019-12-10 DIAGNOSIS — R609 Edema, unspecified: Secondary | ICD-10-CM | POA: Diagnosis not present

## 2019-12-10 DIAGNOSIS — I5031 Acute diastolic (congestive) heart failure: Secondary | ICD-10-CM | POA: Diagnosis not present

## 2019-12-10 DIAGNOSIS — M1000 Idiopathic gout, unspecified site: Secondary | ICD-10-CM | POA: Diagnosis not present

## 2019-12-10 DIAGNOSIS — Z6830 Body mass index (BMI) 30.0-30.9, adult: Secondary | ICD-10-CM | POA: Diagnosis not present

## 2019-12-10 DIAGNOSIS — I10 Essential (primary) hypertension: Secondary | ICD-10-CM | POA: Diagnosis not present

## 2019-12-10 DIAGNOSIS — E213 Hyperparathyroidism, unspecified: Secondary | ICD-10-CM | POA: Diagnosis not present

## 2019-12-10 DIAGNOSIS — N184 Chronic kidney disease, stage 4 (severe): Secondary | ICD-10-CM | POA: Diagnosis not present

## 2019-12-11 DIAGNOSIS — I10 Essential (primary) hypertension: Secondary | ICD-10-CM | POA: Diagnosis not present

## 2019-12-11 DIAGNOSIS — R739 Hyperglycemia, unspecified: Secondary | ICD-10-CM | POA: Diagnosis not present

## 2019-12-11 DIAGNOSIS — E782 Mixed hyperlipidemia: Secondary | ICD-10-CM | POA: Diagnosis not present

## 2019-12-11 DIAGNOSIS — E049 Nontoxic goiter, unspecified: Secondary | ICD-10-CM | POA: Diagnosis not present

## 2019-12-11 DIAGNOSIS — N184 Chronic kidney disease, stage 4 (severe): Secondary | ICD-10-CM | POA: Diagnosis not present

## 2019-12-11 DIAGNOSIS — K219 Gastro-esophageal reflux disease without esophagitis: Secondary | ICD-10-CM | POA: Diagnosis not present

## 2019-12-29 DIAGNOSIS — H26493 Other secondary cataract, bilateral: Secondary | ICD-10-CM | POA: Diagnosis not present

## 2019-12-29 DIAGNOSIS — H02834 Dermatochalasis of left upper eyelid: Secondary | ICD-10-CM | POA: Diagnosis not present

## 2019-12-29 DIAGNOSIS — Z961 Presence of intraocular lens: Secondary | ICD-10-CM | POA: Diagnosis not present

## 2019-12-29 DIAGNOSIS — H02831 Dermatochalasis of right upper eyelid: Secondary | ICD-10-CM | POA: Diagnosis not present

## 2019-12-29 DIAGNOSIS — H1045 Other chronic allergic conjunctivitis: Secondary | ICD-10-CM | POA: Diagnosis not present

## 2019-12-30 ENCOUNTER — Telehealth: Payer: Self-pay | Admitting: *Deleted

## 2019-12-30 MED ORDER — ISOSORBIDE MONONITRATE ER 30 MG PO TB24: 15.0000 mg | ORAL_TABLET | Freq: Every day | ORAL | 1 refills | Status: DC

## 2019-12-30 MED ORDER — AMLODIPINE BESYLATE 2.5 MG PO TABS: 2.5000 mg | ORAL_TABLET | Freq: Every day | ORAL | 2 refills | Status: DC

## 2019-12-30 NOTE — Telephone Encounter (Signed)
Per our telehealth encounter in February she was tolerating Norvasc and it was also helping to control angina symptoms. See if she would be willing to take Norvasc 2.5 mg daily (less likely to cause leg swelling), and also start Imdur 15 mg once daily.

## 2019-12-30 NOTE — Telephone Encounter (Signed)
Patient informed,agrees and verbalized understanding of plan. 

## 2019-12-30 NOTE — Telephone Encounter (Signed)
Reports swelling in ankles and feet since starting amlodipine that has gotten worse. Reports holding amlodipine since Thursday last week 12/25/19 and the swelling improved. BP 200/100 on yesterday.

## 2020-01-21 DIAGNOSIS — Z0100 Encounter for examination of eyes and vision without abnormal findings: Secondary | ICD-10-CM | POA: Diagnosis not present

## 2020-01-27 ENCOUNTER — Encounter: Payer: Self-pay | Admitting: *Deleted

## 2020-01-27 ENCOUNTER — Other Ambulatory Visit: Payer: Self-pay

## 2020-01-27 ENCOUNTER — Encounter: Payer: Self-pay | Admitting: Cardiology

## 2020-01-27 ENCOUNTER — Ambulatory Visit (INDEPENDENT_AMBULATORY_CARE_PROVIDER_SITE_OTHER): Payer: Medicare HMO | Admitting: Cardiology

## 2020-01-27 VITALS — BP 176/84 | HR 60 | Ht 68.0 in | Wt 187.4 lb

## 2020-01-27 DIAGNOSIS — N184 Chronic kidney disease, stage 4 (severe): Secondary | ICD-10-CM | POA: Diagnosis not present

## 2020-01-27 DIAGNOSIS — I25119 Atherosclerotic heart disease of native coronary artery with unspecified angina pectoris: Secondary | ICD-10-CM | POA: Diagnosis not present

## 2020-01-27 DIAGNOSIS — I10 Essential (primary) hypertension: Secondary | ICD-10-CM | POA: Diagnosis not present

## 2020-01-27 MED ORDER — AMLODIPINE BESYLATE 2.5 MG PO TABS: 2.5000 mg | ORAL_TABLET | Freq: Every day | ORAL | 2 refills | Status: DC

## 2020-01-27 NOTE — Patient Instructions (Addendum)
Medication Instructions:   Your physician has recommended you make the following change in your medication:   Restart amlodipine 2.5 mg by mouth daily  Continue other medications the same  Labwork:  NONE  Testing/Procedures:  NONE  Follow-Up:  Your physician recommends that you schedule a follow-up appointment in: 6 months (office)  Any Other Special Instructions Will Be Listed Below (If Applicable).  If you need a refill on your cardiac medications before your next appointment, please call your pharmacy.

## 2020-01-27 NOTE — Progress Notes (Signed)
Cardiology Office Note  Date: 01/27/2020   ID: Julia Santana, DOB 1941-06-04, MRN [id number]  PCP:  Rosalee Kaufman, PA-C  Cardiologist:  Rozann Lesches, MD Electrophysiologist:  None   Chief Complaint  Patient presents with  . Cardiac follow-up    History of Present Illness: Julia Santana is a 79 y.o. female last assessed via telehealth encounter in February.  She presents for a routine visit.  From a cardiac perspective, she does not report any active angina at this time.  I went over home blood pressure checks, her blood pressure has not been well controlled.  She had been on Norvasc at 2.5 mg daily but states that she has not been taking this, thinks that she might have thrown it away.  She states that she had blood work with PCP earlier in the year which we are requesting for review.  Her last creatinine was 2.23.  I have recommended that she see a nephrologist, she thinks that this may have been set up by her PCP.  I reviewed her medications which are outlined below.  She is on Cozaar, Toprol-XL, Demadex, and Imdur, with plan to resume Norvasc at 2.5 mg daily.  Past Medical History:  Diagnosis Date  . Anemia of chronic disease   . Asthma   . CKD (chronic kidney disease) stage 4, GFR 15-29 ml/min (HCC)   . Coronary atherosclerosis of native coronary artery    Multivessel status post CABG 8/13  . Essential hypertension   . GERD (gastroesophageal reflux disease)   . Gout   . Hyperparathyroidism ([place])   . Mixed hyperlipidemia   . Seasonal allergies     Past Surgical History:  Procedure Laterality Date  . CORONARY ARTERY BYPASS GRAFT  03/28/2012   Procedure: CORONARY ARTERY BYPASS GRAFTING (CABG);  Surgeon: Ivin Poot, MD;  Location: [facility];  Service: Open Heart Surgery;  Laterality: N/A;  times 3, using Left Internal Mammary and Right Greater Saphenous  Vein Graft harvested endoscopically  . PARATHYROIDECTOMY  12/15/2011   Procedure: PARATHYROIDECTOMY;   Surgeon: Jamesetta So, MD;  Location: [facility];  Service: General;  Laterality: Bilateral;    Current Outpatient Medications  Medication Sig Dispense Refill  . allopurinol (ZYLOPRIM) 100 MG tablet Take 100 mg by mouth daily.    Marland Kitchen amLODipine (NORVASC) 2.5 MG tablet Take 1 tablet (2.5 mg total) by mouth daily. 90 tablet 2  . aspirin 81 MG tablet Take 81 mg by mouth daily.    Marland Kitchen atorvastatin (LIPITOR) 40 MG tablet Take 1 tablet (40 mg total) by mouth every evening. 30 tablet 11  . isosorbide mononitrate (IMDUR) 30 MG 24 hr tablet Take 0.5 tablets (15 mg total) by mouth daily. 45 tablet 1  . losartan (COZAAR) 100 MG tablet Take 100 mg by mouth daily.    . metoprolol succinate (TOPROL-XL) 50 MG 24 hr tablet Take 50 mg by mouth daily. Take with or immediately following a meal.    . pantoprazole (PROTONIX) 40 MG tablet Take 40 mg by mouth daily.     Marland Kitchen torsemide (DEMADEX) 100 MG tablet Take 100 mg by mouth daily as needed.     No current facility-administered medications for this visit.   Allergies:  Cortisone and Furosemide   ROS:   No palpitations or syncope.  Physical Exam: VS:  BP (!) 176/84 (BP Location: Right Arm, Cuff Size: Large)   Pulse 60   Ht 5\' 8"  (1.727 m)   Wt 187  lb 6.4 oz (85 kg)   SpO2 99%   BMI 28.49 kg/m , BMI Body mass index is 28.49 kg/m.  Wt Readings from Last 3 Encounters:  01/27/20 187 lb 6.4 oz (85 kg)  10/17/19 182 lb (82.6 kg)  09/11/19 188 lb (85.3 kg)    General: Patient appears comfortable at rest. HEENT: Conjunctiva and lids normal, wearing a mask. Neck: Supple, no elevated JVP or carotid bruits, no thyromegaly. Lungs: Clear to auscultation, nonlabored breathing at rest. Cardiac: Regular rate and rhythm, no S3, soft systolic murmur, no pericardial rub. Extremities: Stable lower extremity edema/lymphedema, distal pulses 1-2+.  ECG:  An ECG dated December 2020 was personally reviewed today and demonstrated:  Sinus rhythm with left atrial enlargement,  poor R wave progression rule out old anterior infarct pattern, nonspecific T wave changes.  Recent Labwork:  August 2020: Cholesterol 159, triglycerides 95, HDL 41, LDL 99, hemoglobin 10.2, platelets 186, BUN 27, creatinine 2.23, potassium 4.2, AST 15, ALT 9  Other Studies Reviewed Today:  Echocardiogram 09/17/2019: 1. Left ventricular ejection fraction, by visual estimation, is 65 to  70%. The left ventricle has normal function. There is borderline left  ventricular hypertrophy.  2. Left ventricular diastolic parameters are consistent with Grade I  diastolic dysfunction (impaired relaxation).  3. The left ventricle has no regional wall motion abnormalities.  4. Global right ventricle has normal systolic function.The right  ventricular size is normal. No increase in right ventricular wall  thickness.  5. Left atrial size was upper normal.  6. Right atrial size was normal.  7. The mitral valve is grossly normal. Mild mitral valve regurgitation.  8. The tricuspid valve is grossly normal.  9. The tricuspid valve is grossly normal. Tricuspid valve regurgitation  is trivial.  10. The aortic valve is tricuspid. Aortic valve regurgitation is not  visualized.  11. The pulmonic valve was grossly normal. Pulmonic valve regurgitation is  not visualized.  12. Normal pulmonary artery systolic pressure.  13. The tricuspid regurgitant velocity is 1.93 m/s, and with an assumed  right atrial pressure of 3 mmHg, the estimated right ventricular systolic  pressure is normal at 17.9 mmHg.  14. The inferior vena cava is normal in size with greater than 50%  respiratory variability, suggesting right atrial pressure of 3 mmHg.   Lexiscan Myoview 09/22/2019:  No diagnostic ST segment changes to indicate ischemia.  Small, mild intensity, apical anteroseptal defect that is partially reversible. Suggestive of variable soft tissue attenuation versus minor ischemic territory.  This is a low risk  study.  Nuclear stress EF: 65%.  Assessment and Plan:  1.  CAD status post CABG in 2013.  She does not report any progressive angina symptoms and we will continue medical therapy at this point.  Recent noninvasive cardiac testing from earlier in the year is reviewed above.  LVEF remains normal and Myoview was low risk.  Continue aspirin, Lipitor, Norvasc, Imdur, Toprol-XL, and Cozaar.  2.  Chronic leg edema and lymphedema.  She uses compression stockings and is on high-dose Demadex.  3.  CKD stage IV, last creatinine 2.23.  Requesting interval lab work from PCP.  I have recommended that she see a nephrologist.  4.  Essential hypertension on multimodal therapy, also complicated by problem #3.  Adding back Norvasc 2.5 mg daily.  Medication Adjustments/Labs and Tests Ordered: Current medicines are reviewed at length with the patient today.  Concerns regarding medicines are outlined above.   Tests Ordered: No orders of the defined types  were placed in this encounter.   Medication Changes: Meds ordered this encounter  Medications  . amLODipine (NORVASC) 2.5 MG tablet    Sig: Take 1 tablet (2.5 mg total) by mouth daily.    Dispense:  90 tablet    Refill:  2    12/30/2019 dose decrease    Disposition:  Follow up 6 months in the [place] office.  Signed, Satira Sark, MD, [facility] 01/27/2020 2:09 PM    [facility] [place], [place], [place] [address] Phone: [phone]; Fax: [phone]

## 2020-03-22 ENCOUNTER — Other Ambulatory Visit: Payer: Self-pay | Admitting: Nephrology

## 2020-03-22 ENCOUNTER — Other Ambulatory Visit (HOSPITAL_COMMUNITY): Payer: Self-pay | Admitting: Nephrology

## 2020-03-22 DIAGNOSIS — N184 Chronic kidney disease, stage 4 (severe): Secondary | ICD-10-CM

## 2020-03-22 DIAGNOSIS — I129 Hypertensive chronic kidney disease with stage 1 through stage 4 chronic kidney disease, or unspecified chronic kidney disease: Secondary | ICD-10-CM | POA: Diagnosis not present

## 2020-03-22 DIAGNOSIS — D631 Anemia in chronic kidney disease: Secondary | ICD-10-CM | POA: Diagnosis not present

## 2020-03-22 DIAGNOSIS — M109 Gout, unspecified: Secondary | ICD-10-CM | POA: Diagnosis not present

## 2020-03-30 ENCOUNTER — Other Ambulatory Visit: Payer: Self-pay

## 2020-03-30 ENCOUNTER — Ambulatory Visit (HOSPITAL_COMMUNITY)
Admission: RE | Admit: 2020-03-30 | Discharge: 2020-03-30 | Disposition: A | Discharge status: Home / Self Care | Payer: Medicare HMO | Source: Ambulatory Visit | Attending: Nephrology | Admitting: Nephrology

## 2020-03-30 ENCOUNTER — Ambulatory Visit (HOSPITAL_COMMUNITY): Payer: Medicare HMO

## 2020-03-30 ENCOUNTER — Encounter (HOSPITAL_COMMUNITY): Payer: Self-pay

## 2020-03-30 DIAGNOSIS — N184 Chronic kidney disease, stage 4 (severe): Secondary | ICD-10-CM | POA: Insufficient documentation

## 2020-03-30 DIAGNOSIS — N281 Cyst of kidney, acquired: Secondary | ICD-10-CM | POA: Diagnosis not present

## 2020-04-12 DIAGNOSIS — E049 Nontoxic goiter, unspecified: Secondary | ICD-10-CM | POA: Diagnosis not present

## 2020-04-12 DIAGNOSIS — K219 Gastro-esophageal reflux disease without esophagitis: Secondary | ICD-10-CM | POA: Diagnosis not present

## 2020-04-12 DIAGNOSIS — R609 Edema, unspecified: Secondary | ICD-10-CM | POA: Diagnosis not present

## 2020-04-12 DIAGNOSIS — I10 Essential (primary) hypertension: Secondary | ICD-10-CM | POA: Diagnosis not present

## 2020-04-12 DIAGNOSIS — E782 Mixed hyperlipidemia: Secondary | ICD-10-CM | POA: Diagnosis not present

## 2020-04-12 DIAGNOSIS — N184 Chronic kidney disease, stage 4 (severe): Secondary | ICD-10-CM | POA: Diagnosis not present

## 2020-04-12 DIAGNOSIS — I5031 Acute diastolic (congestive) heart failure: Secondary | ICD-10-CM | POA: Diagnosis not present

## 2020-04-12 DIAGNOSIS — E213 Hyperparathyroidism, unspecified: Secondary | ICD-10-CM | POA: Diagnosis not present

## 2020-06-07 DIAGNOSIS — N189 Chronic kidney disease, unspecified: Secondary | ICD-10-CM | POA: Diagnosis not present

## 2020-06-07 DIAGNOSIS — E213 Hyperparathyroidism, unspecified: Secondary | ICD-10-CM | POA: Diagnosis not present

## 2020-06-07 DIAGNOSIS — I129 Hypertensive chronic kidney disease with stage 1 through stage 4 chronic kidney disease, or unspecified chronic kidney disease: Secondary | ICD-10-CM | POA: Diagnosis not present

## 2020-06-07 DIAGNOSIS — N184 Chronic kidney disease, stage 4 (severe): Secondary | ICD-10-CM | POA: Diagnosis not present

## 2020-06-07 DIAGNOSIS — M109 Gout, unspecified: Secondary | ICD-10-CM | POA: Diagnosis not present

## 2020-06-07 DIAGNOSIS — D631 Anemia in chronic kidney disease: Secondary | ICD-10-CM | POA: Diagnosis not present

## 2020-06-21 DIAGNOSIS — I129 Hypertensive chronic kidney disease with stage 1 through stage 4 chronic kidney disease, or unspecified chronic kidney disease: Secondary | ICD-10-CM | POA: Diagnosis not present

## 2020-08-02 ENCOUNTER — Encounter: Payer: Self-pay | Admitting: Cardiology

## 2020-08-02 ENCOUNTER — Ambulatory Visit: Payer: Medicare HMO | Admitting: Cardiology

## 2020-08-02 ENCOUNTER — Encounter: Payer: Self-pay | Admitting: *Deleted

## 2020-08-02 VITALS — BP 200/100 | HR 74 | Ht 68.0 in | Wt 188.4 lb

## 2020-08-02 DIAGNOSIS — I25119 Atherosclerotic heart disease of native coronary artery with unspecified angina pectoris: Secondary | ICD-10-CM

## 2020-08-02 DIAGNOSIS — N184 Chronic kidney disease, stage 4 (severe): Secondary | ICD-10-CM | POA: Diagnosis not present

## 2020-08-02 DIAGNOSIS — I10 Essential (primary) hypertension: Secondary | ICD-10-CM | POA: Diagnosis not present

## 2020-08-02 MED ORDER — AMLODIPINE BESYLATE 5 MG PO TABS: 5.0000 mg | ORAL_TABLET | Freq: Every day | ORAL | 1 refills | Status: DC

## 2020-08-02 NOTE — Patient Instructions (Addendum)
Medication Instructions:   Your physician has recommended you make the following change in your medication:   Increase amlodipine to 5 mg by mouth daily  Continue other medications the same  Labwork:  None  Testing/Procedures:  None  Follow-Up:  Your physician recommends that you schedule a follow-up appointment in: 6 months.  Any Other Special Instructions Will Be Listed Below (If Applicable).  If you need a refill on your cardiac medications before your next appointment, please call your pharmacy.

## 2020-08-02 NOTE — Progress Notes (Signed)
Cardiology Office Note  Date: 08/02/2020   ID: Julia Santana, DOB 1941/01/15, MRN [id number]  PCP:  Rosalee Kaufman, PA-C  Cardiologist:  Rozann Lesches, MD Electrophysiologist:  None   Chief Complaint  Patient presents with  . Cardiac follow-up    History of Present Illness: Julia Santana is an 79 y.o. female last seen in June.  She presents for a follow-up visit.  Reports no active angina symptoms at this time.  Her blood pressure was significantly elevated today, this has been difficult to control over time.  She is following with Nephrology at this point.  I reviewed her medications which are listed below.  She states that she does not feel well after she takes her morning medications, sometimes nauseous.  We discussed trying to split up her once daily medicines to morning and evening, also up titration of Norvasc.  I personally reviewed her ECG which shows sinus rhythm with old anterior infarct pattern.  We are requesting her most recent lab work from Nephrology.  Past Medical History:  Diagnosis Date  . Anemia of chronic disease   . Asthma   . CKD (chronic kidney disease) stage 4, GFR 15-29 ml/min (HCC)   . Coronary atherosclerosis of native coronary artery    Multivessel status post CABG 8/13  . Essential hypertension   . GERD (gastroesophageal reflux disease)   . Gout   . Hyperparathyroidism ([place])   . Mixed hyperlipidemia   . Seasonal allergies     Past Surgical History:  Procedure Laterality Date  . CORONARY ARTERY BYPASS GRAFT  03/28/2012   Procedure: CORONARY ARTERY BYPASS GRAFTING (CABG);  Surgeon: Ivin Poot, MD;  Location: [facility];  Service: Open Heart Surgery;  Laterality: N/A;  times 3, using Left Internal Mammary and Right Greater Saphenous  Vein Graft harvested endoscopically  . PARATHYROIDECTOMY  12/15/2011   Procedure: PARATHYROIDECTOMY;  Surgeon: Jamesetta So, MD;  [facility];  Service: General;  Laterality: Bilateral;    Current  Outpatient Medications  Medication Sig Dispense Refill  . allopurinol (ZYLOPRIM) 100 MG tablet Take 100 mg by mouth daily.    Marland Kitchen aspirin 81 MG tablet Take 81 mg by mouth daily.    Marland Kitchen atorvastatin (LIPITOR) 40 MG tablet Take 1 tablet (40 mg total) by mouth every evening. 30 tablet 11  . cinacalcet (SENSIPAR) 30 MG tablet Take 1 tablet by mouth daily.    . hydrALAZINE (APRESOLINE) 25 MG tablet Take 1 tablet by mouth 2 (two) times daily.    Marland Kitchen losartan (COZAAR) 100 MG tablet Take 100 mg by mouth daily.    . metoprolol succinate (TOPROL-XL) 50 MG 24 hr tablet Take 50 mg by mouth daily. Take with or immediately following a meal.    . pantoprazole (PROTONIX) 40 MG tablet Take 40 mg by mouth 2 (two) times daily.     Marland Kitchen torsemide (DEMADEX) 100 MG tablet Take 100 mg by mouth daily as needed.    Marland Kitchen amLODipine (NORVASC) 5 MG tablet Take 1 tablet (5 mg total) by mouth daily. 90 tablet 1  . isosorbide mononitrate (IMDUR) 30 MG 24 hr tablet Take 0.5 tablets (15 mg total) by mouth daily. (Patient not taking: Reported on 08/02/2020) 45 tablet 1   No current facility-administered medications for this visit.   Allergies:  Cortisone and Furosemide   ROS: No palpitations or syncope.  Physical Exam: VS:  BP (!) 200/100   Pulse 74   Ht 5\' 8"  (1.727 m)  Wt 188 lb 6.4 oz (85.5 kg)   SpO2 98%   BMI 28.65 kg/m , BMI Body mass index is 28.65 kg/m.  Wt Readings from Last 3 Encounters:  08/02/20 188 lb 6.4 oz (85.5 kg)  01/27/20 187 lb 6.4 oz (85 kg)  10/17/19 182 lb (82.6 kg)    General: Patient appears comfortable at rest. HEENT: Conjunctiva and lids normal, wearing a mask. Neck: Supple, no elevated JVP or carotid bruits, no thyromegaly. Lungs: Clear to auscultation, nonlabored breathing at rest. Cardiac: Regular rate and rhythm, no S3, 2/6 systolic murmur. Extremities: No pitting edema.  ECG:  An ECG dated December 2020 was personally reviewed today and demonstrated:  Sinus rhythm with left atrial  enlargement, poor R wave progression rule out old anterior infarct, nonspecific T wave changes.  Recent Labwork:  August 2020: Cholesterol 159, triglycerides 95, HDL 41, LDL 99, hemoglobin 10.2, platelets 186, BUN 27, creatinine 2.23, potassium 4.2, AST 15, ALT 9  Other Studies Reviewed Today:  Echocardiogram 09/17/2019: 1. Left ventricular ejection fraction, by visual estimation, is 65 to  70%. The left ventricle has normal function. There is borderline left  ventricular hypertrophy.  2. Left ventricular diastolic parameters are consistent with Grade I  diastolic dysfunction (impaired relaxation).  3. The left ventricle has no regional wall motion abnormalities.  4. Global right ventricle has normal systolic function.The right  ventricular size is normal. No increase in right ventricular wall  thickness.  5. Left atrial size was upper normal.  6. Right atrial size was normal.  7. The mitral valve is grossly normal. Mild mitral valve regurgitation.  8. The tricuspid valve is grossly normal.  9. The tricuspid valve is grossly normal. Tricuspid valve regurgitation  is trivial.  10. The aortic valve is tricuspid. Aortic valve regurgitation is not  visualized.  11. The pulmonic valve was grossly normal. Pulmonic valve regurgitation is  not visualized.  12. Normal pulmonary artery systolic pressure.  13. The tricuspid regurgitant velocity is 1.93 m/s, and with an assumed  right atrial pressure of 3 mmHg, the estimated right ventricular systolic  pressure is normal at 17.9 mmHg.  14. The inferior vena cava is normal in size with greater than 50%  respiratory variability, suggesting right atrial pressure of 3 mmHg.   Lexiscan Myoview 09/22/2019:  No diagnostic ST segment changes to indicate ischemia.  Small, mild intensity, apical anteroseptal defect that is partially reversible. Suggestive of variable soft tissue attenuation versus minor ischemic territory.  This is a low  risk study.  Nuclear stress EF: 65%.  Assessment and Plan:  1.  Essential hypertension.  Blood pressure poorly controlled today.  We reviewed her medications, discussed adjustments as noted above.  Norvasc will be increased to 5 mg daily.  Requesting interval lab work from Nephrology.  2.  CKD stage IV.  She is following with Nephrology.  3.  CAD status post CABG in 2013.  No active angina at this time.  She had a low risk Myoview study in January.  Continue observation on aspirin, Lipitor, Norvasc, Imdur, losartan, and Toprol-XL.  Medication Adjustments/Labs and Tests Ordered: Current medicines are reviewed at length with the patient today.  Concerns regarding medicines are outlined above.   Tests Ordered: Orders Placed This Encounter  Procedures  . EKG 12-Lead    Medication Changes: Meds ordered this encounter  Medications  . amLODipine (NORVASC) 5 MG tablet    Sig: Take 1 tablet (5 mg total) by mouth daily.    Dispense:  90 tablet    Refill:  1    08/02/2020 dose increase    Disposition:  Follow up 6 months in the [place] office.  Signed, Satira Sark, MD, [facility] 08/02/2020 4:06 PM    [facility] [place], [place], [place] [address] Phone: [phone]; Fax: [phone]

## 2020-08-09 DIAGNOSIS — I10 Essential (primary) hypertension: Secondary | ICD-10-CM | POA: Diagnosis not present

## 2020-08-09 DIAGNOSIS — N184 Chronic kidney disease, stage 4 (severe): Secondary | ICD-10-CM | POA: Diagnosis not present

## 2020-08-09 DIAGNOSIS — E782 Mixed hyperlipidemia: Secondary | ICD-10-CM | POA: Diagnosis not present

## 2020-08-09 DIAGNOSIS — I5031 Acute diastolic (congestive) heart failure: Secondary | ICD-10-CM | POA: Diagnosis not present

## 2020-08-09 DIAGNOSIS — Z6830 Body mass index (BMI) 30.0-30.9, adult: Secondary | ICD-10-CM | POA: Diagnosis not present

## 2020-08-09 DIAGNOSIS — R609 Edema, unspecified: Secondary | ICD-10-CM | POA: Diagnosis not present

## 2020-08-09 DIAGNOSIS — E049 Nontoxic goiter, unspecified: Secondary | ICD-10-CM | POA: Diagnosis not present

## 2020-08-09 DIAGNOSIS — J45909 Unspecified asthma, uncomplicated: Secondary | ICD-10-CM | POA: Diagnosis not present

## 2020-09-06 DIAGNOSIS — I129 Hypertensive chronic kidney disease with stage 1 through stage 4 chronic kidney disease, or unspecified chronic kidney disease: Secondary | ICD-10-CM | POA: Diagnosis not present

## 2020-09-06 DIAGNOSIS — M109 Gout, unspecified: Secondary | ICD-10-CM | POA: Diagnosis not present

## 2020-09-06 DIAGNOSIS — N189 Chronic kidney disease, unspecified: Secondary | ICD-10-CM | POA: Diagnosis not present

## 2020-09-06 DIAGNOSIS — D631 Anemia in chronic kidney disease: Secondary | ICD-10-CM | POA: Diagnosis not present

## 2020-09-06 DIAGNOSIS — N184 Chronic kidney disease, stage 4 (severe): Secondary | ICD-10-CM | POA: Diagnosis not present

## 2020-09-27 IMAGING — US ULTRASOUND FNA BIOPSY THYROID 1ST LESION
1 series · 13 of 20 positions shown · non-contrast
Comparison: US Thyroid 01/07/19

MEDICATIONS:
6 cc 1% lidocaine

COMPLICATIONS:
None immediate

INDICATION: Indeterminate thyroid nodule

Rt Inferior thyroid nodule 2.3 cm
EXAM:
ULTRASOUND GUIDED FINE NEEDLE ASPIRATION OF INDETERMINATE THYROID
NODULE
TECHNIQUE: Informed written consent was obtained from the patient after a
discussion of the risks, benefits and alternatives to treatment.
Questions regarding the procedure were encouraged and answered. A
timeout was performed prior to the initiation of the procedure.

[Series 1: ultrasound fna biopsy thyroid 1st lesion · 20 acquisitions, 13 frames shown]
[im 1/20]
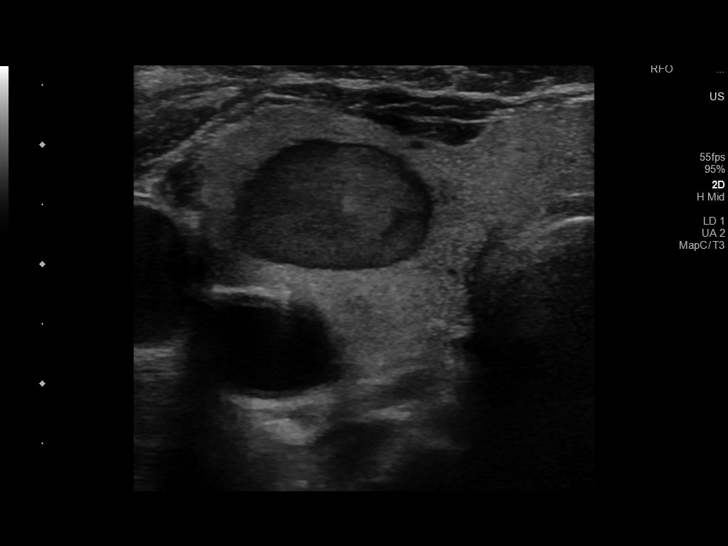
[im 3/20]
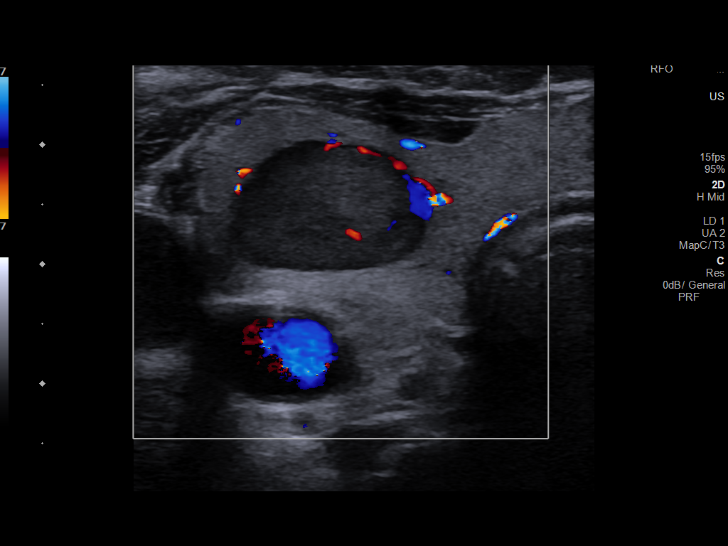
[im 4/20]
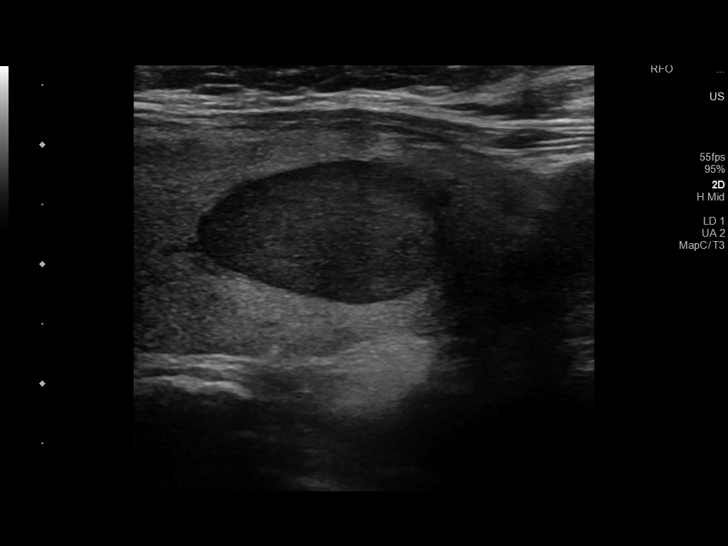
[im 6/20]
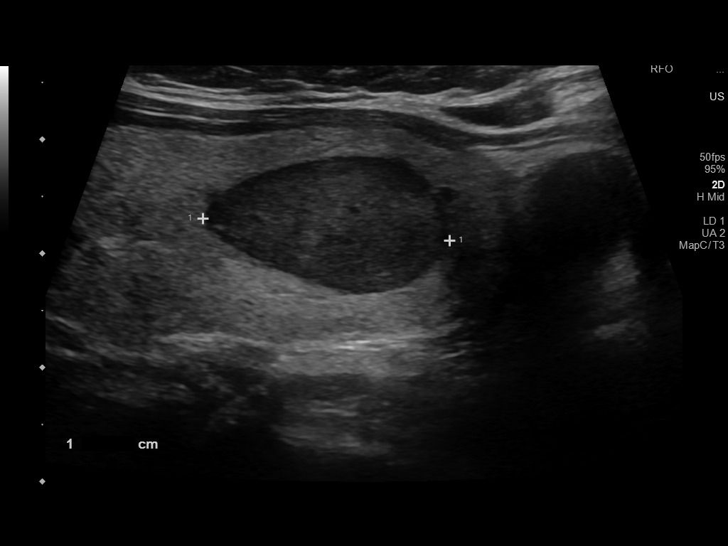
[im 7/20]
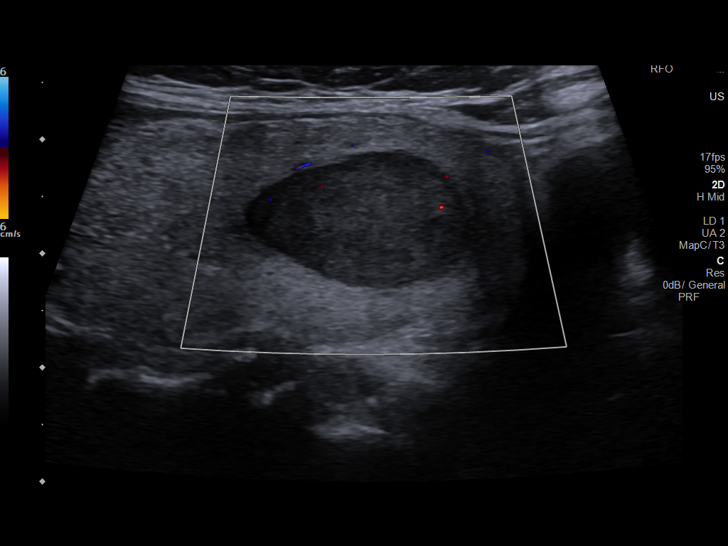
[im 9/20]
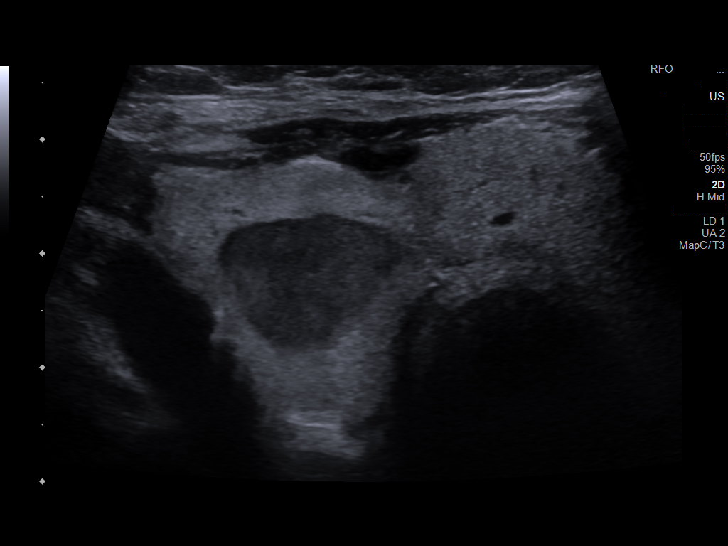
[im 11/20]
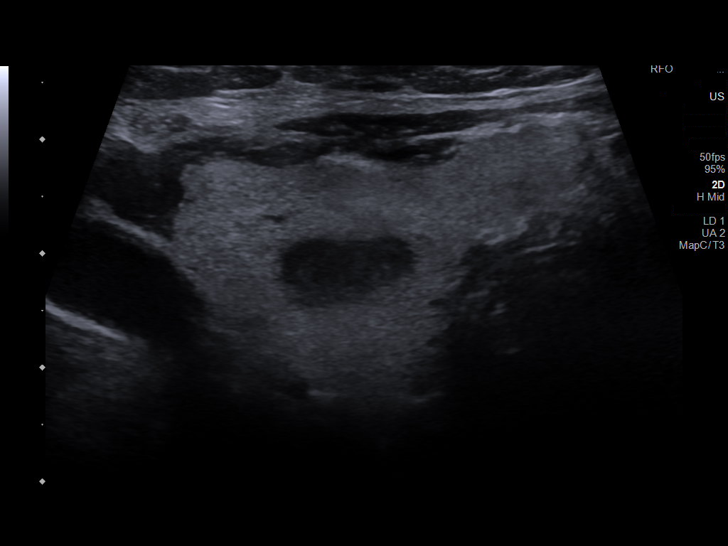
[im 12/20]
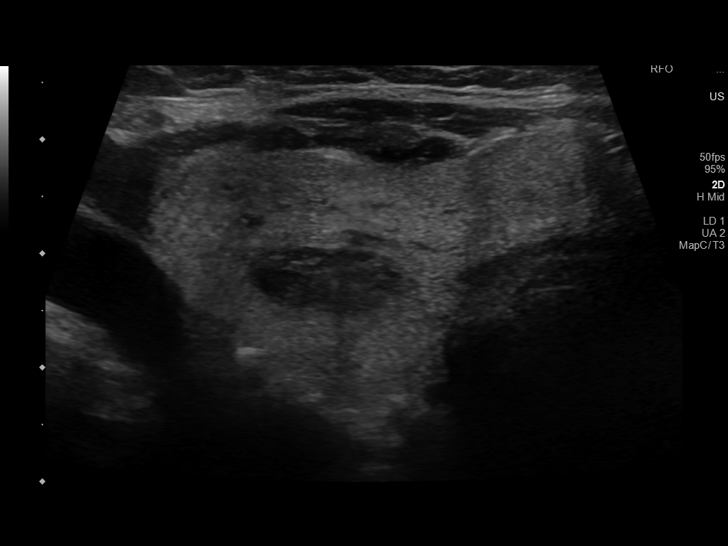
[im 14/20]
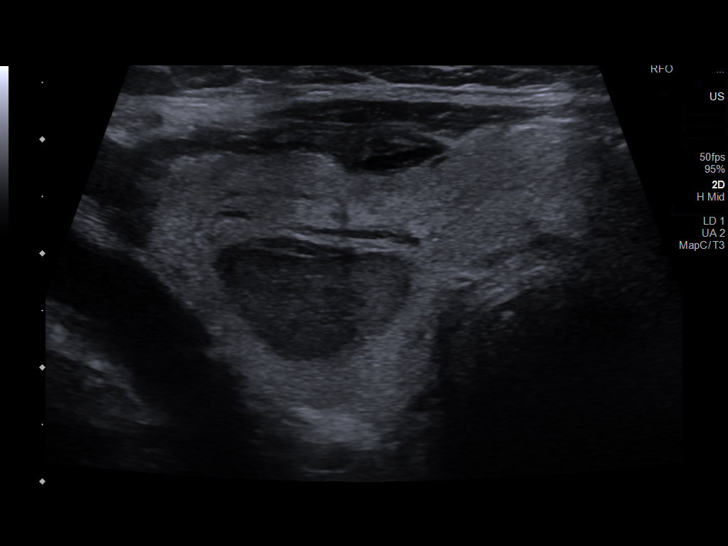
[im 15/20]
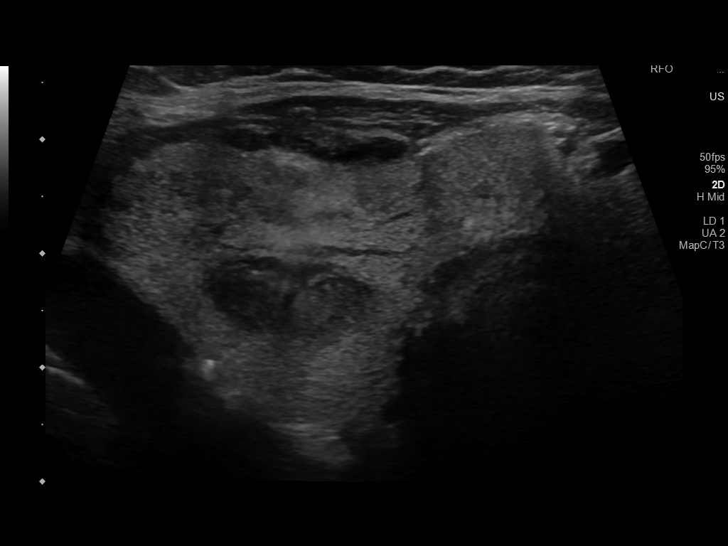
[im 17/20]
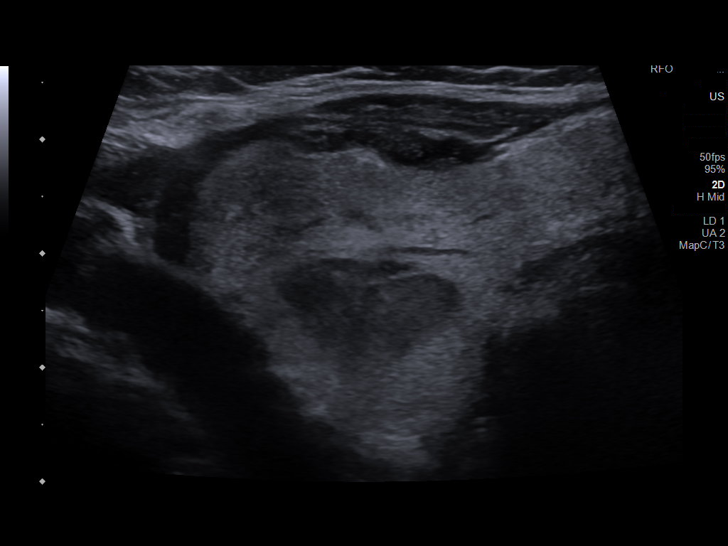
[im 18/20]
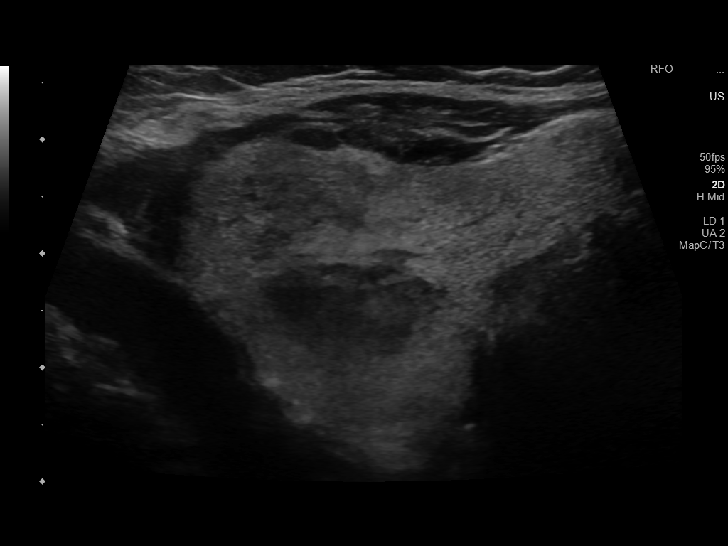
[im 20/20]
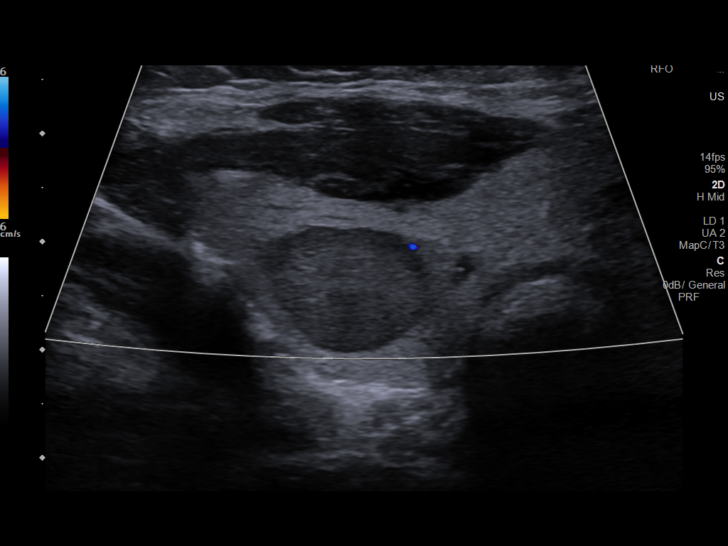

[13 of 20 positions shown; findings below may reference images not displayed]

Pre-procedural ultrasound scanning demonstrated unchanged size and
appearance of the indeterminate nodule within the right thyroid

The procedure was planned. The neck was prepped in the usual sterile
fashion, and a sterile drape was applied covering the operative
field. A timeout was performed prior to the initiation of the
procedure. Local anesthesia was provided with 1% lidocaine.

Under direct ultrasound guidance, 5 FNA biopsies were performed of
the right inferior thyroid nodule with a 25 gauge needle.

2 of these were obtained for AFIRMA per ordering TIGER

Multiple ultrasound images were saved for procedural documentation
purposes. The samples were prepared and submitted to pathology.

Limited post procedural scanning was negative for hematoma or
additional complication. Dressings were placed. The patient
tolerated the above procedures procedure well without immediate
postprocedural complication.
FINDINGS: Nodule reference number based on prior diagnostic ultrasound: 1

Maximum size: 2.3 cm

Location: Right; Inferior

ACR TI-RADS risk category: TR4 (4-6 points)

Reason for biopsy: meets ACR TI-RADS criteria

Ultrasound imaging confirms appropriate placement of the needles
within the thyroid nodule.
IMPRESSION: Technically successful ultrasound guided fine needle aspiration of
right inferior thyroid nodule

Read by

Domitildo Nb

## 2020-11-08 DIAGNOSIS — N184 Chronic kidney disease, stage 4 (severe): Secondary | ICD-10-CM | POA: Diagnosis not present

## 2020-11-08 DIAGNOSIS — N189 Chronic kidney disease, unspecified: Secondary | ICD-10-CM | POA: Diagnosis not present

## 2020-11-08 DIAGNOSIS — D631 Anemia in chronic kidney disease: Secondary | ICD-10-CM | POA: Diagnosis not present

## 2020-11-08 DIAGNOSIS — M109 Gout, unspecified: Secondary | ICD-10-CM | POA: Diagnosis not present

## 2020-11-08 DIAGNOSIS — I129 Hypertensive chronic kidney disease with stage 1 through stage 4 chronic kidney disease, or unspecified chronic kidney disease: Secondary | ICD-10-CM | POA: Diagnosis not present

## 2020-12-06 ENCOUNTER — Encounter: Payer: Self-pay | Admitting: Cardiology

## 2020-12-06 DIAGNOSIS — N184 Chronic kidney disease, stage 4 (severe): Secondary | ICD-10-CM | POA: Diagnosis not present

## 2020-12-06 DIAGNOSIS — D631 Anemia in chronic kidney disease: Secondary | ICD-10-CM | POA: Diagnosis not present

## 2020-12-06 DIAGNOSIS — I129 Hypertensive chronic kidney disease with stage 1 through stage 4 chronic kidney disease, or unspecified chronic kidney disease: Secondary | ICD-10-CM | POA: Diagnosis not present

## 2020-12-06 DIAGNOSIS — M109 Gout, unspecified: Secondary | ICD-10-CM | POA: Diagnosis not present

## 2020-12-06 DIAGNOSIS — N189 Chronic kidney disease, unspecified: Secondary | ICD-10-CM | POA: Diagnosis not present

## 2020-12-08 DIAGNOSIS — E213 Hyperparathyroidism, unspecified: Secondary | ICD-10-CM | POA: Diagnosis not present

## 2020-12-08 DIAGNOSIS — R609 Edema, unspecified: Secondary | ICD-10-CM | POA: Diagnosis not present

## 2020-12-08 DIAGNOSIS — I10 Essential (primary) hypertension: Secondary | ICD-10-CM | POA: Diagnosis not present

## 2020-12-08 DIAGNOSIS — E7849 Other hyperlipidemia: Secondary | ICD-10-CM | POA: Diagnosis not present

## 2020-12-08 DIAGNOSIS — I5031 Acute diastolic (congestive) heart failure: Secondary | ICD-10-CM | POA: Diagnosis not present

## 2020-12-08 DIAGNOSIS — N184 Chronic kidney disease, stage 4 (severe): Secondary | ICD-10-CM | POA: Diagnosis not present

## 2020-12-08 DIAGNOSIS — J45909 Unspecified asthma, uncomplicated: Secondary | ICD-10-CM | POA: Diagnosis not present

## 2020-12-08 DIAGNOSIS — E049 Nontoxic goiter, unspecified: Secondary | ICD-10-CM | POA: Diagnosis not present

## 2021-01-31 NOTE — Progress Notes (Signed)
Cardiology Office Note  Date: 02/01/2021   ID: Julia Santana, DOB 17-Mar-1941, MRN [id number]  PCP:  Rosalee Kaufman, PA-C  Cardiologist:  Rozann Lesches, MD Electrophysiologist:  None   Chief Complaint  Patient presents with  . Cardiac follow-up    History of Present Illness: Julia Santana is an 80 y.o. female last seen in December 2021.  She is here for a routine follow-up visit.  No angina symptoms at this time on medical therapy.  She has not been exercising, we talked about a walking plan.  Norvasc was increased to 5 mg daily at the last visit.  Blood pressure is better today compared to last visit, her automatic cuff is broken and she has not been checking it at home.  She does continue to follow with [facility] every 3 months.  I reviewed the remainder of her medications which are unchanged and outlined below.  Cardiac structural and ischemic testing from last year noted below.  Past Medical History:  Diagnosis Date  . Anemia of chronic disease   . Asthma   . CKD (chronic kidney disease) stage 4, GFR 15-29 ml/min (HCC)   . Coronary atherosclerosis of native coronary artery    Multivessel status post CABG 8/13  . Essential hypertension   . GERD (gastroesophageal reflux disease)   . Gout   . Hyperparathyroidism ([place])   . Mixed hyperlipidemia   . Seasonal allergies     Past Surgical History:  Procedure Laterality Date  . CORONARY ARTERY BYPASS GRAFT  03/28/2012   Procedure: CORONARY ARTERY BYPASS GRAFTING (CABG);  Surgeon: Ivin Poot, MD;  Location: [facility];  Service: Open Heart Surgery;  Laterality: N/A;  times 3, using Left Internal Mammary and Right Greater Saphenous  Vein Graft harvested endoscopically  . PARATHYROIDECTOMY  12/15/2011   Procedure: PARATHYROIDECTOMY;  Surgeon: Jamesetta So, MD;  Location: [facility];  Service: General;  Laterality: Bilateral;    Current Outpatient Medications  Medication Sig Dispense Refill  . albuterol (VENTOLIN HFA) 108  (90 Base) MCG/ACT inhaler     . allopurinol (ZYLOPRIM) 100 MG tablet Take 100 mg by mouth daily.    Marland Kitchen amLODipine (NORVASC) 5 MG tablet Take 1 tablet (5 mg total) by mouth daily. 90 tablet 1  . aspirin 81 MG tablet Take 81 mg by mouth daily.    Marland Kitchen atorvastatin (LIPITOR) 40 MG tablet Take 1 tablet (40 mg total) by mouth every evening. 30 tablet 11  . cinacalcet (SENSIPAR) 30 MG tablet Take 1 tablet by mouth daily.    . hydrALAZINE (APRESOLINE) 25 MG tablet Take 1 tablet by mouth 2 (two) times daily.    . isosorbide mononitrate (IMDUR) 30 MG 24 hr tablet Take 0.5 tablets (15 mg total) by mouth daily. 45 tablet 1  . losartan (COZAAR) 100 MG tablet Take 100 mg by mouth daily.    . metoprolol succinate (TOPROL-XL) 50 MG 24 hr tablet Take 50 mg by mouth daily. Take with or immediately following a meal.    . pantoprazole (PROTONIX) 40 MG tablet Take 40 mg by mouth 2 (two) times daily.     Marland Kitchen torsemide (DEMADEX) 100 MG tablet Take 100 mg by mouth daily as needed.     No current facility-administered medications for this visit.   Allergies:  Cortisone and Furosemide   ROS: No palpitations or syncope.  Physical Exam: VS:  BP (!) 150/80   Pulse 65   Ht 5\' 8"  (1.727 m)  Wt 182 lb 6.4 oz (82.7 kg)   SpO2 99%   BMI 27.73 kg/m , BMI Body mass index is 27.73 kg/m.  Wt Readings from Last 3 Encounters:  02/01/21 182 lb 6.4 oz (82.7 kg)  08/02/20 188 lb 6.4 oz (85.5 kg)  01/27/20 187 lb 6.4 oz (85 kg)    General: Patient appears comfortable at rest. HEENT: Conjunctiva and lids normal, wearing a mask. Neck: Supple, no elevated JVP or carotid bruits, no thyromegaly. Lungs: Clear to auscultation, nonlabored breathing at rest. Cardiac: Regular rate and rhythm, no S3, 2/6 systolic murmur, no pericardial rub. Extremities: No pitting edema.  ECG:  An ECG dated 08/02/2020 was personally reviewed today and demonstrated:  Sinus rhythm with old anterior infarct pattern.  Recent Labwork:  March 2022: AST  15, ALT 7, BUN 37, creatinine 2.72, hemoglobin 10.01 December 2020: Hemoglobin 9.9, platelets 184  Other Studies Reviewed Today:  Echocardiogram 09/17/2019: 1. Left ventricular ejection fraction, by visual estimation, is 65 to  70%. The left ventricle has normal function. There is borderline left  ventricular hypertrophy.  2. Left ventricular diastolic parameters are consistent with Grade I  diastolic dysfunction (impaired relaxation).  3. The left ventricle has no regional wall motion abnormalities.  4. Global right ventricle has normal systolic function.The right  ventricular size is normal. No increase in right ventricular wall  thickness.  5. Left atrial size was upper normal.  6. Right atrial size was normal.  7. The mitral valve is grossly normal. Mild mitral valve regurgitation.  8. The tricuspid valve is grossly normal.  9. The tricuspid valve is grossly normal. Tricuspid valve regurgitation  is trivial.  10. The aortic valve is tricuspid. Aortic valve regurgitation is not  visualized.  11. The pulmonic valve was grossly normal. Pulmonic valve regurgitation is  not visualized.  12. Normal pulmonary artery systolic pressure.  13. The tricuspid regurgitant velocity is 1.93 m/s, and with an assumed  right atrial pressure of 3 mmHg, the estimated right ventricular systolic  pressure is normal at 17.9 mmHg.  14. The inferior vena cava is normal in size with greater than 50%  respiratory variability, suggesting right atrial pressure of 3 mmHg.   Lexiscan Myoview 09/22/2019:  No diagnostic ST segment changes to indicate ischemia.  Small, mild intensity, apical anteroseptal defect that is partially reversible. Suggestive of variable soft tissue attenuation versus minor ischemic territory.  This is a low risk study.  Nuclear stress EF: 65%.  Assessment and Plan:  1.  CAD status post CABG in 2013.  Cardiac structural and ischemic testing from 2021 noted above.  She does  not describe any active angina at this time.  Continue aspirin, Norvasc, Lipitor, hydralazine, Imdur, losartan, and Toprol-XL.  2.  Essential hypertension, blood pressure better than at last check since increase in Norvasc.  I asked her to consider getting a new automatic blood pressure cuff to check at home.  Keep follow-up with [facility].  3.  CKD stage IV, creatinine 2.72.  Medication Adjustments/Labs and Tests Ordered: Current medicines are reviewed at length with the patient today.  Concerns regarding medicines are outlined above.   Tests Ordered: No orders of the defined types were placed in this encounter.   Medication Changes: No orders of the defined types were placed in this encounter.   Disposition:  Follow up 6 months.  Signed, Satira Sark, MD, [facility] 02/01/2021 9:43 AM    [facility] [place], [place],  [place] [address] Phone: [phone]; Fax: [phone]

## 2021-02-01 ENCOUNTER — Encounter: Payer: Self-pay | Admitting: Cardiology

## 2021-02-01 ENCOUNTER — Ambulatory Visit: Payer: Medicare HMO | Admitting: Cardiology

## 2021-02-01 VITALS — BP 150/80 | HR 65 | Ht 68.0 in | Wt 182.4 lb

## 2021-02-01 DIAGNOSIS — N184 Chronic kidney disease, stage 4 (severe): Secondary | ICD-10-CM | POA: Diagnosis not present

## 2021-02-01 DIAGNOSIS — I25119 Atherosclerotic heart disease of native coronary artery with unspecified angina pectoris: Secondary | ICD-10-CM | POA: Diagnosis not present

## 2021-02-01 DIAGNOSIS — I10 Essential (primary) hypertension: Secondary | ICD-10-CM

## 2021-02-01 NOTE — Patient Instructions (Addendum)

## 2021-02-07 DIAGNOSIS — N184 Chronic kidney disease, stage 4 (severe): Secondary | ICD-10-CM | POA: Diagnosis not present

## 2021-02-07 DIAGNOSIS — D631 Anemia in chronic kidney disease: Secondary | ICD-10-CM | POA: Diagnosis not present

## 2021-02-07 DIAGNOSIS — M109 Gout, unspecified: Secondary | ICD-10-CM | POA: Diagnosis not present

## 2021-02-07 DIAGNOSIS — N189 Chronic kidney disease, unspecified: Secondary | ICD-10-CM | POA: Diagnosis not present

## 2021-02-07 DIAGNOSIS — I129 Hypertensive chronic kidney disease with stage 1 through stage 4 chronic kidney disease, or unspecified chronic kidney disease: Secondary | ICD-10-CM | POA: Diagnosis not present

## 2021-03-30 ENCOUNTER — Other Ambulatory Visit: Payer: Self-pay

## 2021-03-30 ENCOUNTER — Ambulatory Visit: Payer: Medicare HMO | Admitting: Vascular Surgery

## 2021-03-30 ENCOUNTER — Encounter: Payer: Self-pay | Admitting: Vascular Surgery

## 2021-03-30 VITALS — BP 189/80 | HR 56 | Temp 98.1°F | Ht 67.0 in | Wt 177.2 lb

## 2021-03-30 DIAGNOSIS — I129 Hypertensive chronic kidney disease with stage 1 through stage 4 chronic kidney disease, or unspecified chronic kidney disease: Secondary | ICD-10-CM | POA: Diagnosis not present

## 2021-03-30 DIAGNOSIS — N184 Chronic kidney disease, stage 4 (severe): Secondary | ICD-10-CM

## 2021-03-30 NOTE — Progress Notes (Signed)
Vascular and Vein Specialist of [place]  Patient name: Julia Santana MRN: [id number] DOB: 1941-01-11 Sex: female  REASON FOR CONSULT: Discuss access for hemodialysis  Nephrologist: [place]  HPI: Julia Santana is a 80 y.o. female, who is here today for discussion of potential hemodialysis access.  She is very pleasant 80 year old with CKD 4.  This was felt to be related to hypertension.  She is not diabetic.  She does have history of coronary disease having undergone coronary artery bypass grafting in 2013.  Mains quite active.  She has not had a pacemaker.  She is right-handed.  Past Medical History:  Diagnosis Date   Anemia of chronic disease    Asthma    CKD (chronic kidney disease) stage 4, GFR 15-29 ml/min (HCC)    Coronary atherosclerosis of native coronary artery    Multivessel status post CABG 8/13   Essential hypertension    GERD (gastroesophageal reflux disease)    Gout    Hyperparathyroidism (HCC)    Mixed hyperlipidemia    Seasonal allergies     Family History  Problem Relation Age of Onset   Hypertension Mother    Diabetes type II Mother    Diabetes type II Sister    Diabetes type II Brother     SOCIAL HISTORY: Social History   Socioeconomic History   Marital status: Married    Spouse name: SHERMAN   Number of children: Not on file   Years of education: Not on file   Highest education level: Not on file  Occupational History   Occupation: RETIRED    Employer: RETIRED  Tobacco Use   Smoking status: Former    Packs/day: 1.00    Years: 20.00    Pack years: 20.00    Types: Cigarettes    Quit date: 08/28/1980    Years since quitting: 40.6   Smokeless tobacco: Never   Tobacco comments:    quit smoking about 25 years ago  Vaping Use   Vaping Use: Never used  Substance and Sexual Activity   Alcohol use: No   Drug use: No   Sexual activity: Not on file  Other Topics Concern   Not on file  Social History  Narrative   Not on file   Social Determinants of Health   Financial Resource Strain: Not on file  Food Insecurity: Not on file  Transportation Needs: Not on file  Physical Activity: Not on file  Stress: Not on file  Social Connections: Not on file  Intimate Partner Violence: Not on file    Allergies  Allergen Reactions   Cortisone     Knot that's lasted 1 year and leg pain   Furosemide Cough    Current Outpatient Medications  Medication Sig Dispense Refill   albuterol (VENTOLIN HFA) 108 (90 Base) MCG/ACT inhaler      allopurinol (ZYLOPRIM) 100 MG tablet Take 100 mg by mouth daily.     aspirin 81 MG tablet Take 81 mg by mouth daily.     atorvastatin (LIPITOR) 40 MG tablet Take 1 tablet (40 mg total) by mouth every evening. 30 tablet 11   cinacalcet (SENSIPAR) 30 MG tablet Take 1 tablet by mouth daily.     hydrALAZINE (APRESOLINE) 25 MG tablet Take 1 tablet by mouth 2 (two) times daily.     losartan (COZAAR) 100 MG tablet Take 100 mg by mouth daily.     metoprolol succinate (TOPROL-XL) 50 MG 24 hr tablet Take 50 mg by mouth  daily. Take with or immediately following a meal.     pantoprazole (PROTONIX) 40 MG tablet Take 40 mg by mouth 2 (two) times daily.      torsemide (DEMADEX) 100 MG tablet Take 100 mg by mouth daily as needed.     amLODipine (NORVASC) 5 MG tablet Take 1 tablet (5 mg total) by mouth daily. 90 tablet 1   isosorbide mononitrate (IMDUR) 30 MG 24 hr tablet Take 0.5 tablets (15 mg total) by mouth daily. 45 tablet 1   No current facility-administered medications for this visit.    REVIEW OF SYSTEMS:  [X]  denotes positive finding, [ ]  denotes negative finding Cardiac  Comments:  Chest pain or chest pressure:    Shortness of breath upon exertion: xx   Short of breath when lying flat:    Irregular heart rhythm:        Vascular    Pain in calf, thigh, or hip brought on by ambulation:    Pain in feet at night that wakes you up from your sleep:     Blood clot in  your veins:    Leg swelling:         Pulmonary    Oxygen at home:    Productive cough:     Wheezing:  x       Neurologic    Sudden weakness in arms or legs:     Sudden numbness in arms or legs:     Sudden onset of difficulty speaking or slurred speech:    Temporary loss of vision in one eye:     Problems with dizziness:         Gastrointestinal    Blood in stool:     Vomited blood:         Genitourinary    Burning when urinating:     Blood in urine:        Psychiatric    Major depression:         Hematologic    Bleeding problems:    Problems with blood clotting too easily:        Skin    Rashes or ulcers:        Constitutional    Fever or chills:      PHYSICAL EXAM: Vitals:   03/30/21 0837  BP: (!) 189/80  Pulse: (!) 56  Temp: 98.1 F (36.7 C)  TempSrc: Temporal  SpO2: 97%  Weight: 177 lb 3.2 oz (80.4 kg)  Height: 5\' 7"  (1.702 m)    GENERAL: The patient is a well-nourished female, in no acute distress. The vital signs are documented above. CARDIOVASCULAR: 2+ radial pulses bilaterally.  Small surface veins bilaterally PULMONARY: There is good air exchange  MUSCULOSKELETAL: There are no major deformities or cyanosis. NEUROLOGIC: No focal weakness or paresthesias are detected. SKIN: There are no ulcers or rashes noted. PSYCHIATRIC: The patient has a normal affect.  DATA:  I imaged her upper extremity veins with SonoSite ultrasound in our office today.  This revealed extremely small cephalic and basilic veins bilaterally.  MEDICAL ISSUES: Had long discussion with the patient regarding access for hemodialysis.  I discussed tunneled hemodialysis catheter, AV fistula and AV graft.  She does not appear to be at imminent need for hemodialysis.  She is not a candidate for fistula creation with an adequate cephalic and basilic veins bilaterally.  I would recommend a left upper arm AV Gore-Tex graft as her first dialysis access.  I explained to the patient that  we  would need 1 month to have this ready for use for dialysis.  She is to see Dr. Marval Regal again in mid September.  We will defer to his judgment regarding timing for left arm AV Gore-Tex graft placement.  I did explain that this could be done as an outpatient at [facility].   Rosetta Posner, MD [facility] Vascular and Vein Specialists of [facility] [phone] Pager [phone]  Note: Portions of this report may have been transcribed using voice recognition software.  Every effort has been made to ensure accuracy; however, inadvertent computerized transcription errors may still be present.

## 2021-04-06 DIAGNOSIS — J45909 Unspecified asthma, uncomplicated: Secondary | ICD-10-CM | POA: Diagnosis not present

## 2021-04-06 DIAGNOSIS — I5031 Acute diastolic (congestive) heart failure: Secondary | ICD-10-CM | POA: Diagnosis not present

## 2021-04-06 DIAGNOSIS — E049 Nontoxic goiter, unspecified: Secondary | ICD-10-CM | POA: Diagnosis not present

## 2021-04-06 DIAGNOSIS — R609 Edema, unspecified: Secondary | ICD-10-CM | POA: Diagnosis not present

## 2021-04-06 DIAGNOSIS — E213 Hyperparathyroidism, unspecified: Secondary | ICD-10-CM | POA: Diagnosis not present

## 2021-04-06 DIAGNOSIS — I10 Essential (primary) hypertension: Secondary | ICD-10-CM | POA: Diagnosis not present

## 2021-04-06 DIAGNOSIS — E7849 Other hyperlipidemia: Secondary | ICD-10-CM | POA: Diagnosis not present

## 2021-04-06 DIAGNOSIS — N184 Chronic kidney disease, stage 4 (severe): Secondary | ICD-10-CM | POA: Diagnosis not present

## 2021-04-27 ENCOUNTER — Encounter: Payer: Medicare HMO | Admitting: Vascular Surgery

## 2021-04-27 ENCOUNTER — Other Ambulatory Visit: Payer: Medicare HMO

## 2021-05-16 DIAGNOSIS — M109 Gout, unspecified: Secondary | ICD-10-CM | POA: Diagnosis not present

## 2021-05-16 DIAGNOSIS — I129 Hypertensive chronic kidney disease with stage 1 through stage 4 chronic kidney disease, or unspecified chronic kidney disease: Secondary | ICD-10-CM | POA: Diagnosis not present

## 2021-05-16 DIAGNOSIS — N184 Chronic kidney disease, stage 4 (severe): Secondary | ICD-10-CM | POA: Diagnosis not present

## 2021-05-16 DIAGNOSIS — D631 Anemia in chronic kidney disease: Secondary | ICD-10-CM | POA: Diagnosis not present

## 2021-05-16 DIAGNOSIS — N189 Chronic kidney disease, unspecified: Secondary | ICD-10-CM | POA: Diagnosis not present

## 2021-05-18 ENCOUNTER — Other Ambulatory Visit (HOSPITAL_COMMUNITY): Payer: Self-pay | Admitting: Nephrology

## 2021-05-18 ENCOUNTER — Other Ambulatory Visit (HOSPITAL_BASED_OUTPATIENT_CLINIC_OR_DEPARTMENT_OTHER): Payer: Self-pay | Admitting: Nephrology

## 2021-05-20 ENCOUNTER — Other Ambulatory Visit: Payer: Self-pay | Admitting: Nephrology

## 2021-05-20 ENCOUNTER — Other Ambulatory Visit (HOSPITAL_COMMUNITY): Payer: Self-pay | Admitting: Nephrology

## 2021-05-20 DIAGNOSIS — M79631 Pain in right forearm: Secondary | ICD-10-CM | POA: Diagnosis not present

## 2021-05-20 DIAGNOSIS — G930 Cerebral cysts: Secondary | ICD-10-CM | POA: Diagnosis not present

## 2021-05-20 DIAGNOSIS — S0990XA Unspecified injury of head, initial encounter: Secondary | ICD-10-CM | POA: Diagnosis not present

## 2021-05-20 DIAGNOSIS — S098XXA Other specified injuries of head, initial encounter: Secondary | ICD-10-CM | POA: Diagnosis not present

## 2021-05-20 DIAGNOSIS — M7989 Other specified soft tissue disorders: Secondary | ICD-10-CM | POA: Diagnosis not present

## 2021-05-20 DIAGNOSIS — M79642 Pain in left hand: Secondary | ICD-10-CM | POA: Diagnosis not present

## 2021-05-20 DIAGNOSIS — Z7982 Long term (current) use of aspirin: Secondary | ICD-10-CM | POA: Diagnosis not present

## 2021-05-20 DIAGNOSIS — W1812XA Fall from or off toilet with subsequent striking against object, initial encounter: Secondary | ICD-10-CM | POA: Diagnosis not present

## 2021-05-20 DIAGNOSIS — I959 Hypotension, unspecified: Secondary | ICD-10-CM | POA: Diagnosis not present

## 2021-05-20 DIAGNOSIS — R102 Pelvic and perineal pain: Secondary | ICD-10-CM | POA: Diagnosis not present

## 2021-05-20 DIAGNOSIS — S0181XA Laceration without foreign body of other part of head, initial encounter: Secondary | ICD-10-CM | POA: Diagnosis not present

## 2021-05-20 DIAGNOSIS — S01511A Laceration without foreign body of lip, initial encounter: Secondary | ICD-10-CM | POA: Diagnosis not present

## 2021-05-20 DIAGNOSIS — S60211A Contusion of right wrist, initial encounter: Secondary | ICD-10-CM | POA: Diagnosis not present

## 2021-05-20 DIAGNOSIS — M79641 Pain in right hand: Secondary | ICD-10-CM | POA: Diagnosis not present

## 2021-05-20 DIAGNOSIS — I10 Essential (primary) hypertension: Secondary | ICD-10-CM | POA: Diagnosis not present

## 2021-05-20 DIAGNOSIS — W19XXXA Unspecified fall, initial encounter: Secondary | ICD-10-CM | POA: Diagnosis not present

## 2021-05-27 DIAGNOSIS — Z4802 Encounter for removal of sutures: Secondary | ICD-10-CM | POA: Diagnosis not present

## 2021-05-27 DIAGNOSIS — S01511D Laceration without foreign body of lip, subsequent encounter: Secondary | ICD-10-CM | POA: Diagnosis not present

## 2021-08-03 ENCOUNTER — Encounter: Payer: Self-pay | Admitting: Cardiology

## 2021-08-03 ENCOUNTER — Ambulatory Visit: Payer: Medicare HMO | Admitting: Cardiology

## 2021-08-03 ENCOUNTER — Encounter: Payer: Self-pay | Admitting: *Deleted

## 2021-08-03 VITALS — BP 152/76 | HR 68 | Ht 67.0 in | Wt 163.0 lb

## 2021-08-03 DIAGNOSIS — I25119 Atherosclerotic heart disease of native coronary artery with unspecified angina pectoris: Secondary | ICD-10-CM | POA: Diagnosis not present

## 2021-08-03 DIAGNOSIS — N184 Chronic kidney disease, stage 4 (severe): Secondary | ICD-10-CM

## 2021-08-03 DIAGNOSIS — R0989 Other specified symptoms and signs involving the circulatory and respiratory systems: Secondary | ICD-10-CM

## 2021-08-03 NOTE — Patient Instructions (Addendum)
Medication Instructions:  Your physician recommends that you continue on your current medications as directed. Please refer to the Current Medication list given to you today.  Labwork: none  Testing/Procedures: Your physician has requested that you have a carotid duplex. This test is an ultrasound of the carotid arteries in your neck. It looks at blood flow through these arteries that supply the brain with blood. Allow one hour for this exam. There are no restrictions or special instructions.  Follow-Up: Your physician recommends that you schedule a follow-up appointment in: 6 months  Any Other Special Instructions Will Be Listed Below (If Applicable).  If you need a refill on your cardiac medications before your next appointment, please call your pharmacy. 

## 2021-08-03 NOTE — Progress Notes (Signed)
Cardiology Office Note  Date: 08/03/2021   ID: Cindi Ghazarian Dymond, DOB [birth year], MRN [id number]  PCP:  Rosalee Kaufman, PA-C  Cardiologist:  Rozann Lesches, MD Electrophysiologist:  None   Chief Complaint  Patient presents with   Cardiac follow-up    History of Present Illness: Julia Santana is an 80 y.o. female last seen in June.  She is here for a routine visit.  She does not report any angina symptoms with typical ADLs.  States that she has had trouble with her appetite, following with PCP.  She has lost over 10 pounds since August.  She does not report any palpitations or syncope.  We went over her medications which are noted below.  Requesting interval lipid panel from PCP.  She continues to follow with Dr. Marval Regal for nephrology follow-up, CKD stage IV.  Her last creatinine was 2.57 in September.  I personally reviewed her ECG today which shows sinus rhythm with possible left atrial enlargement and poor R wave progression rule out old anterior infarct pattern.  Past Medical History:  Diagnosis Date   Anemia of chronic disease    Asthma    CKD (chronic kidney disease) stage 4, GFR 15-29 ml/min (HCC)    Coronary atherosclerosis of native coronary artery    Multivessel status post CABG 8/13   Essential hypertension    GERD (gastroesophageal reflux disease)    Gout    Hyperparathyroidism ([place])    Mixed hyperlipidemia    Seasonal allergies     Past Surgical History:  Procedure Laterality Date   CORONARY ARTERY BYPASS GRAFT  03/28/2012   Procedure: CORONARY ARTERY BYPASS GRAFTING (CABG);  Surgeon: Ivin Poot, MD;  Location: [place];  Service: Open Heart Surgery;  Laterality: N/A;  times 3, using Left Internal Mammary and Right Greater Saphenous  Vein Graft harvested endoscopically   PARATHYROIDECTOMY  12/15/2011   Procedure: PARATHYROIDECTOMY;  Surgeon: Jamesetta So, MD;  Location: [facility];  Service: General;  Laterality: Bilateral;    Current Outpatient  Medications  Medication Sig Dispense Refill   albuterol (VENTOLIN HFA) 108 (90 Base) MCG/ACT inhaler      allopurinol (ZYLOPRIM) 100 MG tablet Take 100 mg by mouth daily.     amLODipine (NORVASC) 5 MG tablet Take 5 mg by mouth daily.     aspirin 81 MG tablet Take 81 mg by mouth daily.     atorvastatin (LIPITOR) 40 MG tablet Take 1 tablet (40 mg total) by mouth every evening. 30 tablet 11   cinacalcet (SENSIPAR) 30 MG tablet Take 1 tablet by mouth daily.     hydrALAZINE (APRESOLINE) 25 MG tablet Take 1 tablet by mouth 2 (two) times daily.     isosorbide mononitrate (IMDUR) 30 MG 24 hr tablet Take 15 mg by mouth daily.     losartan (COZAAR) 100 MG tablet Take 100 mg by mouth daily.     metoprolol succinate (TOPROL-XL) 50 MG 24 hr tablet Take 50 mg by mouth daily. Take with or immediately following a meal.     pantoprazole (PROTONIX) 40 MG tablet Take 40 mg by mouth 2 (two) times daily.      torsemide (DEMADEX) 100 MG tablet Take 100 mg by mouth daily as needed.     No current facility-administered medications for this visit.   Allergies:  Cortisone and Furosemide   ROS: No orthopnea or PND.  Physical Exam: VS:  BP (!) 152/76 (BP Location: Left Arm, Patient Position: Sitting, Cuff Size:  Normal)   Pulse 68   Ht 5\' 7"  (1.702 m)   Wt 163 lb (73.9 kg)   BMI 25.53 kg/m , BMI Body mass index is 25.53 kg/m.  Wt Readings from Last 3 Encounters:  08/03/21 163 lb (73.9 kg)  03/30/21 177 lb 3.2 oz (80.4 kg)  02/01/21 182 lb 6.4 oz (82.7 kg)    General: Patient appears comfortable at rest. HEENT: Conjunctiva and lids normal, wearing a mask. Neck: Supple, no elevated JVP, left carotid bruit, no thyromegaly. Lungs: Clear to auscultation, nonlabored breathing at rest. Cardiac: Regular rate and rhythm, no S3, 2/6 systolic murmur, no pericardial rub. Extremities: No pitting edema.  ECG:  An ECG dated 08/02/2020 was personally reviewed today and demonstrated:  Sinus rhythm with old anterior  infarct pattern.  Recent Labwork:  September 2022: Hemoglobin 10.1, platelets 191, potassium 4.6, BUN 26, creatinine 2.57, AST 30, ALT 15  Other Studies Reviewed Today:  Echocardiogram 09/17/2019:  1. Left ventricular ejection fraction, by visual estimation, is 65 to  70%. The left ventricle has normal function. There is borderline left  ventricular hypertrophy.   2. Left ventricular diastolic parameters are consistent with Grade I  diastolic dysfunction (impaired relaxation).   3. The left ventricle has no regional wall motion abnormalities.   4. Global right ventricle has normal systolic function.The right  ventricular size is normal. No increase in right ventricular wall  thickness.   5. Left atrial size was upper normal.   6. Right atrial size was normal.   7. The mitral valve is grossly normal. Mild mitral valve regurgitation.   8. The tricuspid valve is grossly normal.   9. The tricuspid valve is grossly normal. Tricuspid valve regurgitation  is trivial.  10. The aortic valve is tricuspid. Aortic valve regurgitation is not  visualized.  11. The pulmonic valve was grossly normal. Pulmonic valve regurgitation is  not visualized.  12. Normal pulmonary artery systolic pressure.  13. The tricuspid regurgitant velocity is 1.93 m/s, and with an assumed  right atrial pressure of 3 mmHg, the estimated right ventricular systolic  pressure is normal at 17.9 mmHg.  14. The inferior vena cava is normal in size with greater than 50%  respiratory variability, suggesting right atrial pressure of 3 mmHg.    Lexiscan Myoview 09/22/2019: No diagnostic ST segment changes to indicate ischemia. Small, mild intensity, apical anteroseptal defect that is partially reversible. Suggestive of variable soft tissue attenuation versus minor ischemic territory. This is a low risk study. Nuclear stress EF: 65%.  Assessment and Plan:  1.  Multivessel CAD status post CABG in 2013.  She does not report  any active angina on medical therapy and underwent low risk ischemic testing in January of last year.  Plan to continue observation on aspirin, Lipitor, losartan, Toprol-XL, hydralazine, Norvasc, and Imdur.  ECG reviewed.  2.  Nonobstructive carotid artery disease prior to CABG in 2013.  Left carotid bruit evident, we will obtain follow-up carotid Dopplers.  She is on aspirin and Lipitor.  3.  CKD stage IV, creatinine 2.57.  Keep follow-up with nephrology.  4.  Essential hypertension on multimodal therapy.  No changes were made today.  Medication Adjustments/Labs and Tests Ordered: Current medicines are reviewed at length with the patient today.  Concerns regarding medicines are outlined above.   Tests Ordered: Orders Placed This Encounter  Procedures   EKG 12-Lead   VAS US CAROTID    Medication Changes: No orders of the defined types were placed in  this encounter.   Disposition:  Follow up  6 months.  Signed, Satira Sark, MD, [facility] 08/03/2021 10:31 AM    [place] at [place], [place], [place] [address] Phone: [phone]; Fax: [phone]

## 2021-08-04 ENCOUNTER — Telehealth: Payer: Self-pay | Admitting: *Deleted

## 2021-08-04 ENCOUNTER — Encounter: Payer: Self-pay | Admitting: *Deleted

## 2021-08-04 DIAGNOSIS — E782 Mixed hyperlipidemia: Secondary | ICD-10-CM

## 2021-08-04 NOTE — Progress Notes (Signed)
Last lipid check by PCP was 04/17/2019

## 2021-08-04 NOTE — Progress Notes (Signed)
Satira Sark, MD  Merlene Laughter, RN Caller: Unspecified (Today,  3:37 PM) Please get fasting lipid profile.        Previous Messages

## 2021-08-04 NOTE — Telephone Encounter (Signed)
Satira Sark, MD  Merlene Laughter, RN Caller: Unspecified (Today,  3:37 PM) Please get fasting lipid profile.        Previous Messages

## 2021-08-05 ENCOUNTER — Other Ambulatory Visit: Payer: Self-pay | Admitting: *Deleted

## 2021-08-05 DIAGNOSIS — E782 Mixed hyperlipidemia: Secondary | ICD-10-CM

## 2021-08-05 NOTE — Telephone Encounter (Signed)
Patient informed and verbalized understanding of plan. Lab order placed for [facility]-says she will go Monday to [address]

## 2021-08-08 DIAGNOSIS — E049 Nontoxic goiter, unspecified: Secondary | ICD-10-CM | POA: Diagnosis not present

## 2021-08-08 DIAGNOSIS — N184 Chronic kidney disease, stage 4 (severe): Secondary | ICD-10-CM | POA: Diagnosis not present

## 2021-08-08 DIAGNOSIS — R634 Abnormal weight loss: Secondary | ICD-10-CM | POA: Diagnosis not present

## 2021-08-08 DIAGNOSIS — I129 Hypertensive chronic kidney disease with stage 1 through stage 4 chronic kidney disease, or unspecified chronic kidney disease: Secondary | ICD-10-CM | POA: Diagnosis not present

## 2021-08-08 DIAGNOSIS — M109 Gout, unspecified: Secondary | ICD-10-CM | POA: Diagnosis not present

## 2021-08-08 DIAGNOSIS — N189 Chronic kidney disease, unspecified: Secondary | ICD-10-CM | POA: Diagnosis not present

## 2021-08-08 DIAGNOSIS — D631 Anemia in chronic kidney disease: Secondary | ICD-10-CM | POA: Diagnosis not present

## 2021-08-09 ENCOUNTER — Other Ambulatory Visit (HOSPITAL_COMMUNITY): Payer: Self-pay | Admitting: Nephrology

## 2021-08-23 DIAGNOSIS — E213 Hyperparathyroidism, unspecified: Secondary | ICD-10-CM | POA: Diagnosis not present

## 2021-09-15 ENCOUNTER — Ambulatory Visit (INDEPENDENT_AMBULATORY_CARE_PROVIDER_SITE_OTHER): Payer: Medicare HMO

## 2021-09-15 DIAGNOSIS — R0989 Other specified symptoms and signs involving the circulatory and respiratory systems: Secondary | ICD-10-CM

## 2021-09-20 ENCOUNTER — Telehealth: Payer: Self-pay | Admitting: *Deleted

## 2021-09-20 NOTE — Telephone Encounter (Signed)
Patient informed. Copy sent to PCP °

## 2021-09-20 NOTE — Telephone Encounter (Signed)
-----   Message from Satira Sark, MD sent at 09/15/2021  4:57 PM EST ----- Results reviewed.  Carotid Dopplers demonstrate 1 to 39% RICA stenosis and 40 to 63% LICA stenosis.  Continue with present medications and repeat carotid Dopplers in 1 year.

## 2021-10-06 DIAGNOSIS — R609 Edema, unspecified: Secondary | ICD-10-CM | POA: Diagnosis not present

## 2021-10-06 DIAGNOSIS — E213 Hyperparathyroidism, unspecified: Secondary | ICD-10-CM | POA: Diagnosis not present

## 2021-10-06 DIAGNOSIS — I10 Essential (primary) hypertension: Secondary | ICD-10-CM | POA: Diagnosis not present

## 2021-10-06 DIAGNOSIS — N184 Chronic kidney disease, stage 4 (severe): Secondary | ICD-10-CM | POA: Diagnosis not present

## 2021-10-06 DIAGNOSIS — J45909 Unspecified asthma, uncomplicated: Secondary | ICD-10-CM | POA: Diagnosis not present

## 2021-10-06 DIAGNOSIS — I5031 Acute diastolic (congestive) heart failure: Secondary | ICD-10-CM | POA: Diagnosis not present

## 2021-10-06 DIAGNOSIS — E7849 Other hyperlipidemia: Secondary | ICD-10-CM | POA: Diagnosis not present

## 2021-10-06 DIAGNOSIS — E049 Nontoxic goiter, unspecified: Secondary | ICD-10-CM | POA: Diagnosis not present

## 2021-10-30 IMAGING — US US RENAL
1 series · 13 of 25 positions shown · non-contrast
Comparison: Ultrasound 09/14/2017

CLINICAL DATA: CKD stage 4

EXAM:
RENAL / URINARY TRACT ULTRASOUND COMPLETE

[Series 1: us renal · 13 of 45 slices shown]
[im 1/45]
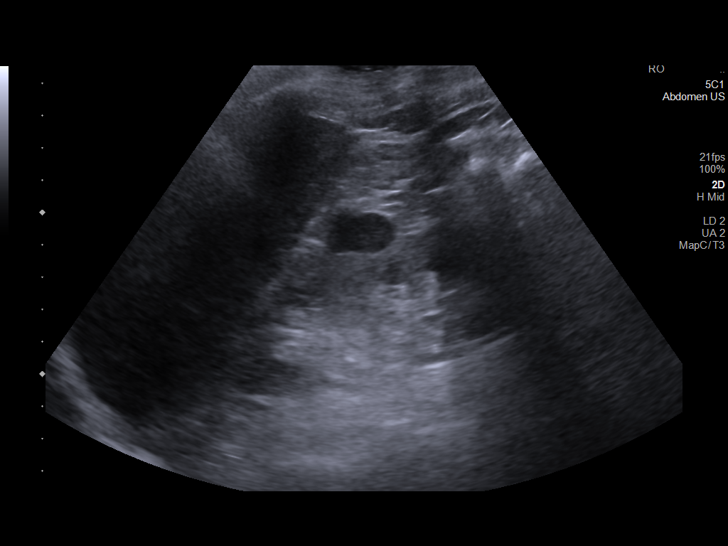
[im 4/45]
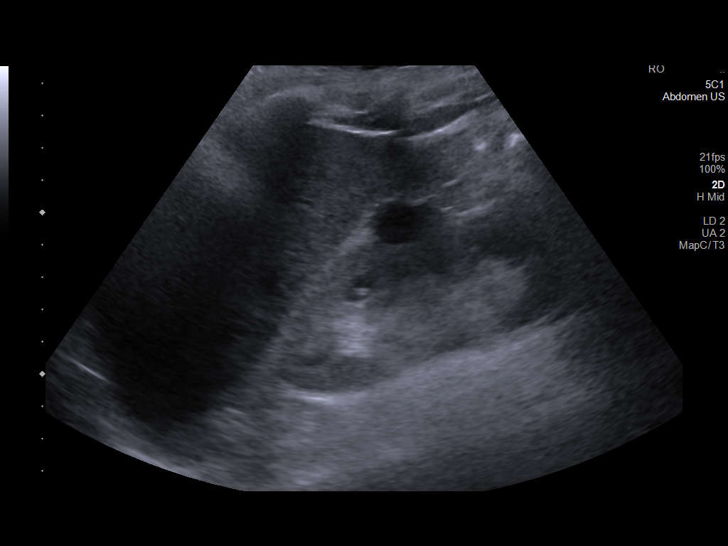
[im 8/45]
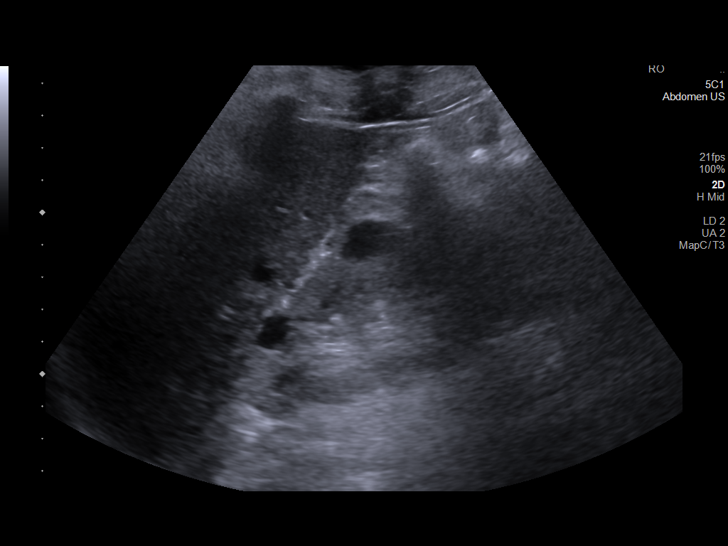
[im 12/45]
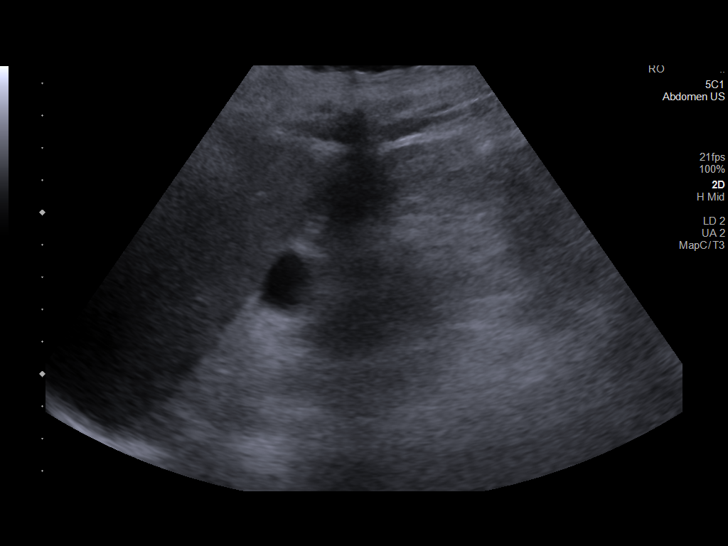
[im 15/45]
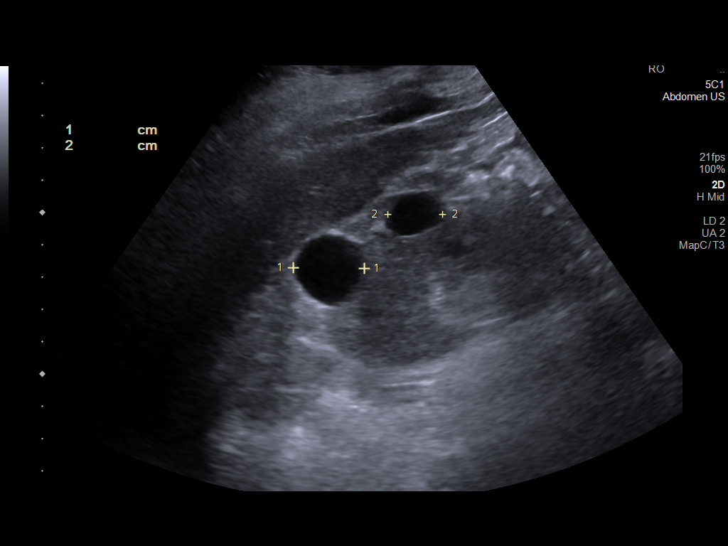
[im 19/45]
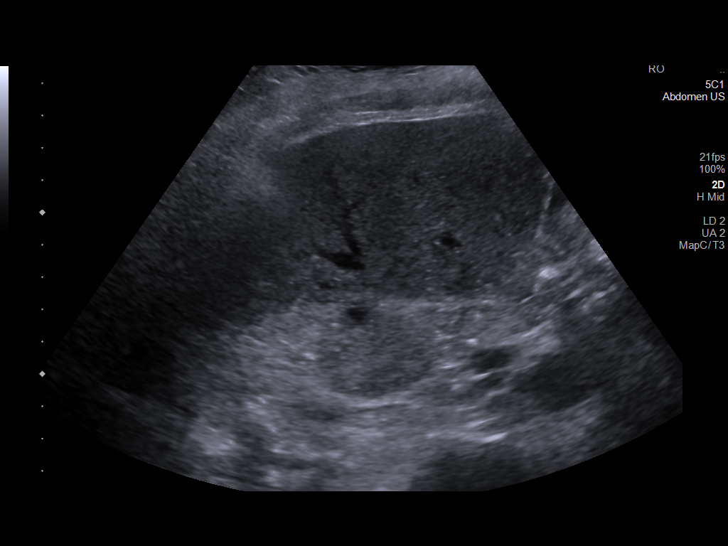
[im 23/45]
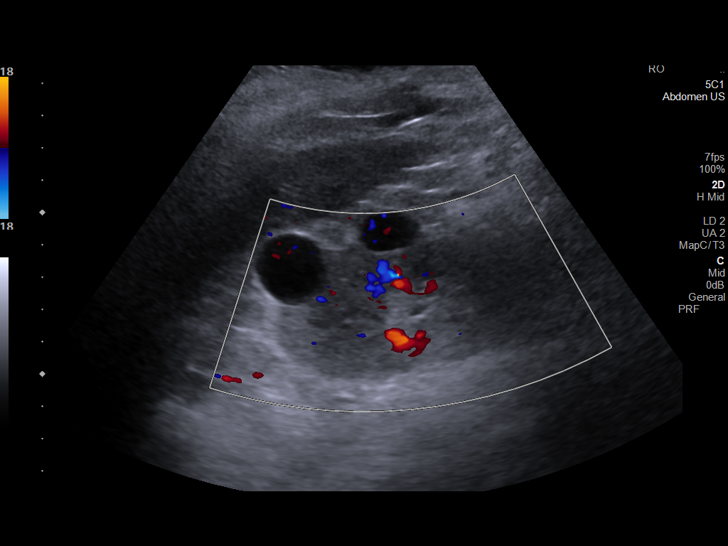
[im 26/45]
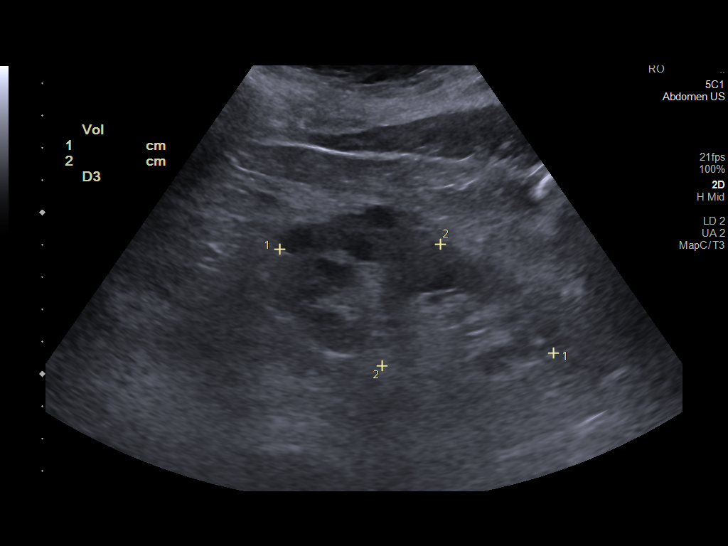
[im 30/45]
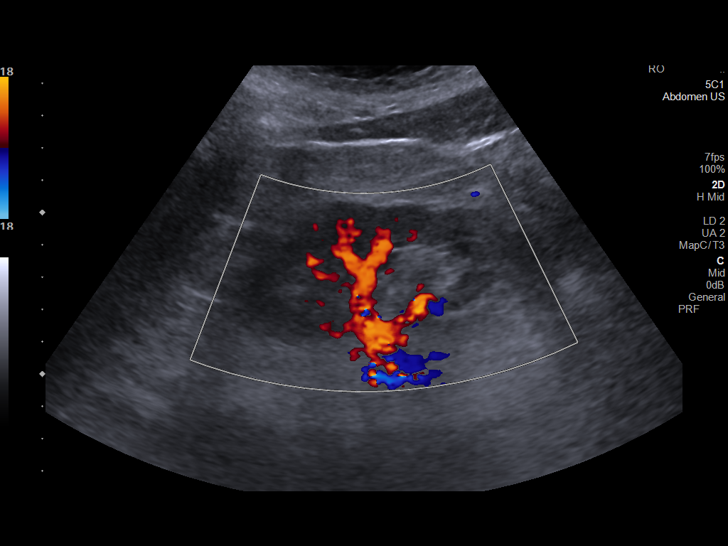
[im 34/45]
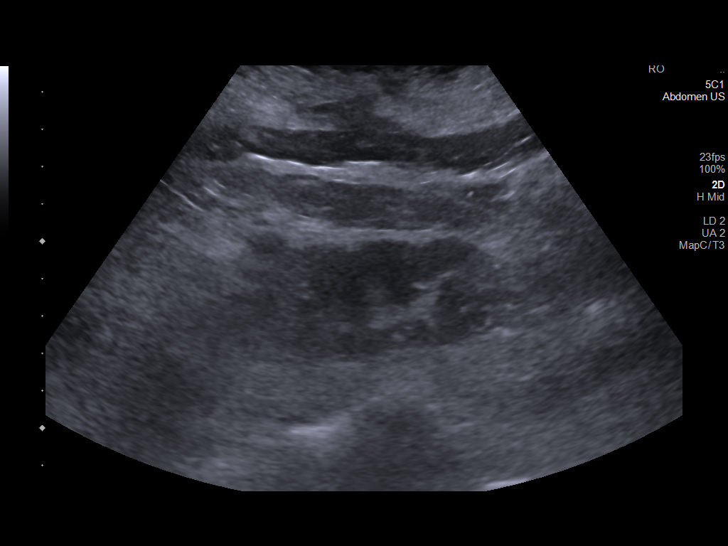
[im 37/45]
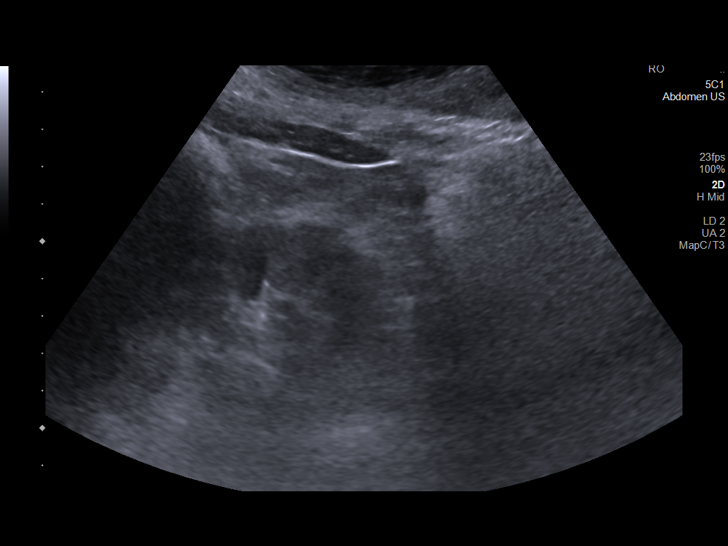
[im 41/45]
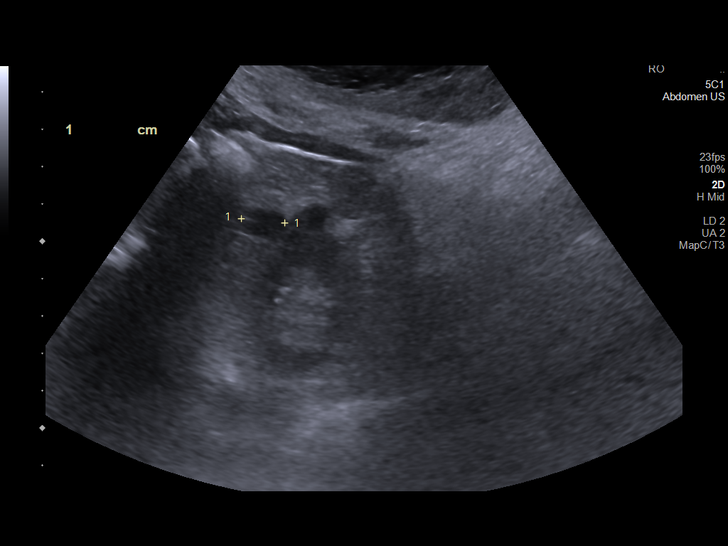
[im 45/45]
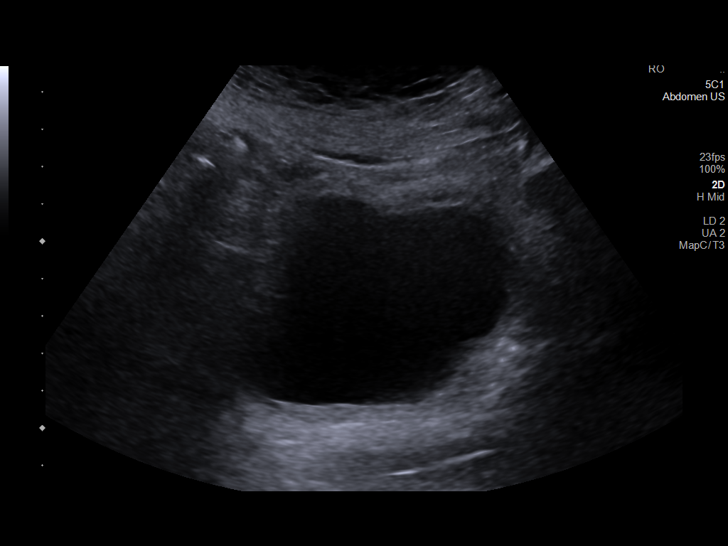

[13 of 25 positions shown; findings below may reference images not displayed]

FINDINGS: Right Kidney:

Renal measurements: 10 x 4.5 x 5.3 cm = volume: 124 mL. Diffusely
increased renal cortical echogenicity. Diffuse multi cystic
appearance of the right kidney with the largest cysts in the
interpolar kidney to upper pole. Examples include an upper pole cyst
measuring up to 2.2 cm size and interpolar cyst measuring up to
cm. No concerning features. No worrisome renal lesions. No shadowing
calculus or hydronephrosis.

Left Kidney:

Renal measurements: 9.1 x 4.2 x 3.3 cm = volume: 65 mL. Diffusely
increased renal cortical echogenicity. Nodular appearance of the
left kidney. Multiple anechoic left renal cysts are present as well,
largest also seen in the mid to upper pole measuring up to 1.3 and
1.2 cm in size without worrisome features. No concerning renal
lesions. No shadowing calculus or hydronephrosis.

Bladder:

Appears normal for degree of bladder distention.

Other:

None.
IMPRESSION: Diffusely increased renal cortical echogenicity compatible with
medical renal disease.

Mild asymmetry of the renal size, could reflect some asymmetric left
renal scarring.

Multiple simple appearing cysts in both kidneys. No concerning renal
lesions.

## 2021-10-31 DIAGNOSIS — I129 Hypertensive chronic kidney disease with stage 1 through stage 4 chronic kidney disease, or unspecified chronic kidney disease: Secondary | ICD-10-CM | POA: Diagnosis not present

## 2021-10-31 DIAGNOSIS — D631 Anemia in chronic kidney disease: Secondary | ICD-10-CM | POA: Diagnosis not present

## 2021-10-31 DIAGNOSIS — N184 Chronic kidney disease, stage 4 (severe): Secondary | ICD-10-CM | POA: Diagnosis not present

## 2021-12-12 DIAGNOSIS — R634 Abnormal weight loss: Secondary | ICD-10-CM | POA: Diagnosis not present

## 2021-12-12 DIAGNOSIS — M109 Gout, unspecified: Secondary | ICD-10-CM | POA: Diagnosis not present

## 2021-12-12 DIAGNOSIS — N184 Chronic kidney disease, stage 4 (severe): Secondary | ICD-10-CM | POA: Diagnosis not present

## 2021-12-12 DIAGNOSIS — I129 Hypertensive chronic kidney disease with stage 1 through stage 4 chronic kidney disease, or unspecified chronic kidney disease: Secondary | ICD-10-CM | POA: Diagnosis not present

## 2021-12-12 DIAGNOSIS — E049 Nontoxic goiter, unspecified: Secondary | ICD-10-CM | POA: Diagnosis not present

## 2021-12-12 DIAGNOSIS — D631 Anemia in chronic kidney disease: Secondary | ICD-10-CM | POA: Diagnosis not present

## 2022-01-31 DIAGNOSIS — I5031 Acute diastolic (congestive) heart failure: Secondary | ICD-10-CM | POA: Diagnosis not present

## 2022-01-31 DIAGNOSIS — M1000 Idiopathic gout, unspecified site: Secondary | ICD-10-CM | POA: Diagnosis not present

## 2022-01-31 DIAGNOSIS — J45909 Unspecified asthma, uncomplicated: Secondary | ICD-10-CM | POA: Diagnosis not present

## 2022-01-31 DIAGNOSIS — Z0001 Encounter for general adult medical examination with abnormal findings: Secondary | ICD-10-CM | POA: Diagnosis not present

## 2022-01-31 DIAGNOSIS — R609 Edema, unspecified: Secondary | ICD-10-CM | POA: Diagnosis not present

## 2022-01-31 DIAGNOSIS — I10 Essential (primary) hypertension: Secondary | ICD-10-CM | POA: Diagnosis not present

## 2022-01-31 DIAGNOSIS — E049 Nontoxic goiter, unspecified: Secondary | ICD-10-CM | POA: Diagnosis not present

## 2022-01-31 DIAGNOSIS — N184 Chronic kidney disease, stage 4 (severe): Secondary | ICD-10-CM | POA: Diagnosis not present

## 2022-01-31 DIAGNOSIS — E7849 Other hyperlipidemia: Secondary | ICD-10-CM | POA: Diagnosis not present

## 2022-02-05 DIAGNOSIS — R0789 Other chest pain: Secondary | ICD-10-CM | POA: Diagnosis not present

## 2022-02-05 DIAGNOSIS — Z5321 Procedure and treatment not carried out due to patient leaving prior to being seen by health care provider: Secondary | ICD-10-CM | POA: Diagnosis not present

## 2022-02-05 DIAGNOSIS — R079 Chest pain, unspecified: Secondary | ICD-10-CM | POA: Diagnosis not present

## 2022-02-05 DIAGNOSIS — Z951 Presence of aortocoronary bypass graft: Secondary | ICD-10-CM | POA: Diagnosis not present

## 2022-02-05 DIAGNOSIS — Z7982 Long term (current) use of aspirin: Secondary | ICD-10-CM | POA: Diagnosis not present

## 2022-02-05 DIAGNOSIS — Z79899 Other long term (current) drug therapy: Secondary | ICD-10-CM | POA: Diagnosis not present

## 2022-02-13 DIAGNOSIS — E213 Hyperparathyroidism, unspecified: Secondary | ICD-10-CM | POA: Diagnosis not present

## 2022-02-13 DIAGNOSIS — I5031 Acute diastolic (congestive) heart failure: Secondary | ICD-10-CM | POA: Diagnosis not present

## 2022-02-13 DIAGNOSIS — E7849 Other hyperlipidemia: Secondary | ICD-10-CM | POA: Diagnosis not present

## 2022-02-13 DIAGNOSIS — R609 Edema, unspecified: Secondary | ICD-10-CM | POA: Diagnosis not present

## 2022-02-13 DIAGNOSIS — M1000 Idiopathic gout, unspecified site: Secondary | ICD-10-CM | POA: Diagnosis not present

## 2022-02-13 DIAGNOSIS — I10 Essential (primary) hypertension: Secondary | ICD-10-CM | POA: Diagnosis not present

## 2022-02-13 DIAGNOSIS — K219 Gastro-esophageal reflux disease without esophagitis: Secondary | ICD-10-CM | POA: Diagnosis not present

## 2022-02-13 DIAGNOSIS — N184 Chronic kidney disease, stage 4 (severe): Secondary | ICD-10-CM | POA: Diagnosis not present

## 2022-02-13 DIAGNOSIS — E049 Nontoxic goiter, unspecified: Secondary | ICD-10-CM | POA: Diagnosis not present

## 2022-02-20 ENCOUNTER — Encounter: Payer: Self-pay | Admitting: Cardiology

## 2022-02-20 ENCOUNTER — Ambulatory Visit: Payer: Medicare HMO | Admitting: Cardiology

## 2022-02-20 VITALS — BP 204/90 | HR 63 | Ht 67.0 in | Wt 149.8 lb

## 2022-02-20 DIAGNOSIS — I10 Essential (primary) hypertension: Secondary | ICD-10-CM

## 2022-02-20 DIAGNOSIS — E782 Mixed hyperlipidemia: Secondary | ICD-10-CM

## 2022-02-20 DIAGNOSIS — I25119 Atherosclerotic heart disease of native coronary artery with unspecified angina pectoris: Secondary | ICD-10-CM

## 2022-02-20 DIAGNOSIS — Z79899 Other long term (current) drug therapy: Secondary | ICD-10-CM

## 2022-02-20 MED ORDER — TORSEMIDE 100 MG PO TABS: 50.0000 mg | ORAL_TABLET | Freq: Every day | ORAL | 1 refills | Status: DC

## 2022-02-20 MED ORDER — HYDRALAZINE HCL 25 MG PO TABS
25.0000 mg | ORAL_TABLET | Freq: Two times a day (BID) | ORAL | 1 refills | Status: DC
Start: 2022-02-20 — End: 2022-05-30

## 2022-02-20 MED ORDER — ISOSORBIDE MONONITRATE ER 30 MG PO TB24: 30.0000 mg | ORAL_TABLET | Freq: Every day | ORAL | 1 refills | Status: DC

## 2022-03-27 DIAGNOSIS — R634 Abnormal weight loss: Secondary | ICD-10-CM | POA: Diagnosis not present

## 2022-03-27 DIAGNOSIS — I129 Hypertensive chronic kidney disease with stage 1 through stage 4 chronic kidney disease, or unspecified chronic kidney disease: Secondary | ICD-10-CM | POA: Diagnosis not present

## 2022-03-27 DIAGNOSIS — E049 Nontoxic goiter, unspecified: Secondary | ICD-10-CM | POA: Diagnosis not present

## 2022-03-27 DIAGNOSIS — I10 Essential (primary) hypertension: Secondary | ICD-10-CM | POA: Diagnosis not present

## 2022-03-27 DIAGNOSIS — M109 Gout, unspecified: Secondary | ICD-10-CM | POA: Diagnosis not present

## 2022-03-27 DIAGNOSIS — N189 Chronic kidney disease, unspecified: Secondary | ICD-10-CM | POA: Diagnosis not present

## 2022-03-27 DIAGNOSIS — Z79899 Other long term (current) drug therapy: Secondary | ICD-10-CM | POA: Diagnosis not present

## 2022-03-27 DIAGNOSIS — D631 Anemia in chronic kidney disease: Secondary | ICD-10-CM | POA: Diagnosis not present

## 2022-03-27 DIAGNOSIS — N184 Chronic kidney disease, stage 4 (severe): Secondary | ICD-10-CM | POA: Diagnosis not present

## 2022-03-28 LAB — BASIC METABOLIC PANEL
BUN/Creatinine Ratio: 15 (ref 12–28)
BUN: 51 mg/dL — ABNORMAL HIGH (ref 8–27)
CO2: 23 mmol/L (ref 20–29)
Calcium: 11.2 mg/dL — ABNORMAL HIGH (ref 8.7–10.3)
Chloride: 98 mmol/L (ref 96–106)
Creatinine, Ser: 3.37 mg/dL — ABNORMAL HIGH (ref 0.57–1.00)
Glucose: 82 mg/dL (ref 70–99)
Potassium: 4.1 mmol/L (ref 3.5–5.2)
Sodium: 137 mmol/L (ref 134–144)
eGFR: 13 mL/min/{1.73_m2} — ABNORMAL LOW (ref >59)

## 2022-04-03 ENCOUNTER — Other Ambulatory Visit: Payer: Self-pay | Admitting: *Deleted

## 2022-04-03 DIAGNOSIS — N184 Chronic kidney disease, stage 4 (severe): Secondary | ICD-10-CM

## 2022-04-03 NOTE — Progress Notes (Signed)
Amb ref

## 2022-04-04 ENCOUNTER — Telehealth: Payer: Self-pay | Admitting: Student

## 2022-04-04 NOTE — Telephone Encounter (Signed)
Spoke with Julia Santana at [facility] and advised that we are aware that she is established with their office. Advised that provider here wanted to make Dr. Marval Regal aware of recent lab work by [place] and if evaluation is needed, he can make that decision. Verbalized understanding.

## 2022-04-04 NOTE — Telephone Encounter (Signed)
[name] from [facility] called. She want someone to call her at Julia Santana. In regards to this patients care at [facility].

## 2022-04-14 NOTE — Telephone Encounter (Signed)
Per Nira Conn at [place]- per Dr. Marval Regal pt is ok to keep her apt for October

## 2022-05-03 ENCOUNTER — Encounter: Payer: Self-pay | Admitting: *Deleted

## 2022-05-03 ENCOUNTER — Telehealth: Payer: Self-pay | Admitting: Cardiology

## 2022-05-03 NOTE — Telephone Encounter (Signed)
Office note from [facility] with request to increase hydralazine requested Advised patient to contact nephrology office and request prescription with new directions for 3 per day of hydralazine since it was increased by their office. Verbalized understanding.

## 2022-05-03 NOTE — Telephone Encounter (Signed)
Pt c/o medication issue:  1. Name of Medication:   hydrALAZINE (APRESOLINE) 25 MG tablet    2. How are you currently taking this medication (dosage and times per day)? Take 1 tablet (25 mg total) by mouth 2 (two) times daily.  3. Are you having a reaction (difficulty breathing--STAT)? No  4. What is your medication issue? Pt states the her Kidney doctor suggested that she takes 3 tablets by mouth daily and now she is out of medication. She would like to know if medication instructions can be changed and called into the pharmacy being that she is not able to get a refill due to it being too soon. Please advise

## 2022-05-05 ENCOUNTER — Telehealth: Payer: Self-pay | Admitting: *Deleted

## 2022-05-05 ENCOUNTER — Telehealth: Payer: Self-pay

## 2022-05-05 ENCOUNTER — Encounter: Payer: Self-pay | Admitting: *Deleted

## 2022-05-05 DIAGNOSIS — I10 Essential (primary) hypertension: Secondary | ICD-10-CM

## 2022-05-05 NOTE — Telephone Encounter (Signed)
   Telephone encounter was:  Successful.  05/05/2022 Name: Julia Santana MRN: [id number] DOB: [birth year]  [name] is a 81 y.o. year old female who is a primary care patient of Rosine Door . The community resource team was consulted for assistance with Transportation Needs  and [place] guide performed the following interventions: Patient provided with information about care guide support team and interviewed to confirm resource needs.   CG advised reason of call for food resources and transportation. CG briefly discussed resources that are available and advised patient information can also be sent in mail, however, patient declined resources for food and transportation. CG will advise referring provider/clinic.  Patient does have a transportation benefit/vendor through [facility] called [employer]. She has 24 one-way rides / 12 round-way trips with them. All rides must be called 3 days in advance. Patient can also contact [place] [phone] for transportation needs.   Follow Up Plan:  No further follow up planned at this time.  [place] management  [place], [place]  Main Phone: [phone]  E-mail: [email or web address]  Website: [email or web address]

## 2022-05-05 NOTE — Patient Outreach (Signed)
  Care Coordination   Initial Visit Note   05/05/2022 Name: Julia Santana MRN: [id number] DOB: 1941/05/01  Julia Santana is a 81 y.o. year old female who sees [facility], Aundra [place], [place] for primary care. I spoke with  Julia Santana by phone today.  What matters to the patients health and wellness today?  Report doing well with the exception of concern that she will not have her blood pressure medicine over the weekend (Hydralazine).  It was increased but there was an issue with the refill prescription.  Agrees to have pharmacy team assist.  She does not monitor daily, state machine broke and she has not had money to purchase another one.  Otherwise denies any urgent concerns, encouraged to contact this care manager with questions.      Goals Addressed               This Visit's Progress     Better control of blood pressure (pt-stated)        Care Coordination Interventions: Evaluation of current treatment plan related to hypertension self management and patient's adherence to plan as established by provider Provided assistance with obtaining home blood pressure monitor via Will have BP monitor sent to member, unable to purchase one; Discussed plans with patient for ongoing care management follow up and provided patient with direct contact information for care management team Advised patient, providing education and rationale, to monitor blood pressure daily and record, calling PCP for findings outside established parameters Reviewed scheduled/upcoming provider appointments including:  Assessed social determinant of health barriers Referral to pharmacy team for help with obtaining Hydralazine Referral to community resource care guide for transportation and food resources         SDOH assessments and interventions completed:  Yes     Care Coordination Interventions Activated:  Yes  Care Coordination Interventions:  Yes, provided   Follow up plan: Referral made to pharmacy  team Follow up call scheduled for 9/11    Encounter Outcome:  Pt. Visit Completed   Valente David, RN, MSN, [place] [facility] Care Management Coordinator [phone]

## 2022-05-05 NOTE — Patient Instructions (Signed)
Visit Information  Thank you for taking time to visit with me today. Please don't hesitate to contact me if I can be of assistance to you before our next scheduled telephone appointment.  Following are the goals we discussed today:  Monitor blood pressure daily, recording readings to share with providers.   Our next appointment is by telephone on 9/11  Please call the care guide team at [phone] if you need to cancel or reschedule your appointment.   Please call the Suicide and Crisis Lifeline: 988 call the [place] National Suicide Prevention Lifeline: (616) 339-6100 or TTY: 470-050-9661 TTY 819-263-0569) to talk to a trained counselor call 1-800-273-TALK (toll free, 24 hour hotline) call the [facility]: 913-251-1542 call 911 if you are experiencing a Mental Health or [place] or need someone to talk to.  The patient verbalized understanding of instructions, educational materials, and care plan provided today and agreed to receive a mailed copy of patient instructions, educational materials, and care plan.   The patient has been provided with contact information for the care management team and has been advised to call with any health related questions or concerns.   Valente David, RN, MSN, [place] Care Management Care Management Coordinator [phone]

## 2022-05-08 ENCOUNTER — Ambulatory Visit: Payer: Self-pay | Admitting: *Deleted

## 2022-05-08 NOTE — Patient Outreach (Signed)
  Care Coordination   05/08/2022 Name: Julia Santana MRN: [id number] DOB: 02-17-1941   Care Coordination Outreach Attempts:  An unsuccessful telephone outreach was attempted for a scheduled appointment today.  Follow Up Plan:  Additional outreach attempts will be made to offer the patient care coordination information and services.   Encounter Outcome:  No Answer  Care Coordination Interventions Activated:  No   Care Coordination Interventions:  No, not indicated    Valente David, RN, MSN, [facility] Care Management Care Management Coordinator [phone]

## 2022-05-19 ENCOUNTER — Telehealth: Payer: Self-pay | Admitting: *Deleted

## 2022-05-19 NOTE — Chronic Care Management (AMB) (Unsigned)
  Care Coordination   Note   05/19/2022 Name: Kayelyn Lemon [place]ma MRN: [id number] DOB: 1941-05-08  Garner Gavel Marcinek is a 81 y.o. year old female who sees [place], Aundra Millet, [place] for primary care. I reached out to [employer] by phone today to reschedule follow up call with [facility] for care coordination services.    Follow up plan:  Unsuccessful telephone outreach attempt made.   Encounter Outcome:  No Answer  [place]  Direct Dial: [phone]

## 2022-05-22 NOTE — Chronic Care Management (AMB) (Unsigned)
  Care Coordination   Note   05/22/2022 Name: Julia Santana MRN: [id number] DOB: 1941-02-25  [name] is a 81 y.o. year old female who sees [place], Aundra Millet, [place] for primary care. I reached out to [employer] by phone today to reschedule for care coordination services.   Follow up plan:  Unsuccessful telephone outreach attempt made. A HIPAA compliant phone message was left for the patient providing contact information and requesting a return call.  Encounter Outcome:  Pt. Request to Call [place]: [phone]

## 2022-05-24 NOTE — Chronic Care Management (AMB) (Signed)
  Care Coordination   Note   05/24/2022 Name: Shoshanah Dapper Borchardt MRN: [id number] DOB: 1941/05/13  Garner Gavel Bognar is a 81 y.o. year old female who sees [place], Aundra Millet, [place] for primary care. I reached out to [employer] by phone today to offer care coordination services.   Follow up plan:  Telephone appointment with care coordination team member scheduled for:  06/07/22  Encounter Outcome:  Pt. Scheduled  [place]  Direct Dial: [phone]

## 2022-05-29 DIAGNOSIS — M109 Gout, unspecified: Secondary | ICD-10-CM | POA: Diagnosis not present

## 2022-05-29 DIAGNOSIS — N184 Chronic kidney disease, stage 4 (severe): Secondary | ICD-10-CM | POA: Diagnosis not present

## 2022-05-29 DIAGNOSIS — D631 Anemia in chronic kidney disease: Secondary | ICD-10-CM | POA: Diagnosis not present

## 2022-05-29 DIAGNOSIS — I129 Hypertensive chronic kidney disease with stage 1 through stage 4 chronic kidney disease, or unspecified chronic kidney disease: Secondary | ICD-10-CM | POA: Diagnosis not present

## 2022-05-29 NOTE — Progress Notes (Signed)
Cardiology Office Note  Date: 05/30/2022   ID: Julia Santana, DOB Mar 15, 1941, MRN [id number]  PCP:  Rosalee Kaufman, PA-C  Cardiologist:  Rozann Lesches, MD Electrophysiologist:  None   Chief Complaint  Patient presents with   Cardiac follow-up    History of Present Illness: Julia Santana is an 81 y.o. female last seen in June.  She is here for a routine visit.  Reports no angina and stable NYHA class II dyspnea.  She had a follow-up visit with [facility] yesterday with lab work obtained, we are requesting the results.  Her creatinine was 3.3 in July.  Hydralazine was increased to 50 mg 3 times a day yesterday.  I reviewed her medications.  She tells me that she had trouble with her appetite on hydralazine in the past.  Has already been taken off losartan given progressive renal insufficiency, did not tolerate Norvasc due to leg swelling.  She has done well on current dose of Toprol-XL and remains on Demadex.  We will see how she does with recent medication adjustments prior to considering another agent.   Past Medical History:  Diagnosis Date   Anemia of chronic disease    Asthma    CKD (chronic kidney disease) stage 4, GFR 15-29 ml/min (HCC)    Coronary atherosclerosis of native coronary artery    Multivessel status post CABG 8/13   Essential hypertension    GERD (gastroesophageal reflux disease)    Gout    Hyperparathyroidism ([place])    Mixed hyperlipidemia    Seasonal allergies     Past Surgical History:  Procedure Laterality Date   CORONARY ARTERY BYPASS GRAFT  03/28/2012   Procedure: CORONARY ARTERY BYPASS GRAFTING (CABG);  Surgeon: Ivin Poot, MD;  Location: [facility];  Service: Open Heart Surgery;  Laterality: N/A;  times 3, using Left Internal Mammary and Right Greater Saphenous  Vein Graft harvested endoscopically   PARATHYROIDECTOMY  12/15/2011   Procedure: PARATHYROIDECTOMY;  Surgeon: Jamesetta So, MD;  Location: [facility];  Service: General;   Laterality: Bilateral;    Current Outpatient Medications  Medication Sig Dispense Refill   albuterol (VENTOLIN HFA) 108 (90 Base) MCG/ACT inhaler      aspirin 81 MG tablet Take 81 mg by mouth daily.     atorvastatin (LIPITOR) 40 MG tablet Take 1 tablet (40 mg total) by mouth every evening. 30 tablet 11   hydrALAZINE (APRESOLINE) 50 MG tablet Take 50 mg by mouth 3 (three) times daily.     isosorbide mononitrate (IMDUR) 30 MG 24 hr tablet Take 1 tablet (30 mg total) by mouth daily. 90 tablet 1   metoprolol succinate (TOPROL-XL) 100 MG 24 hr tablet Take 100 mg by mouth daily.     pantoprazole (PROTONIX) 40 MG tablet Take 40 mg by mouth 2 (two) times daily.      torsemide (DEMADEX) 100 MG tablet Take 0.5 tablets (50 mg total) by mouth daily. 45 tablet 1   No current facility-administered medications for this visit.   Allergies:  Cortisone and Furosemide   ROS: No orthopnea or PND.  Physical Exam: VS:  BP (!) 158/62 Comment: "Nervous"  Pulse 61   Ht '5\' 7"'$  (1.702 m)   Wt 148 lb 9.6 oz (67.4 kg)   SpO2 98%   BMI 23.27 kg/m , BMI Body mass index is 23.27 kg/m.  Wt Readings from Last 3 Encounters:  05/30/22 148 lb 9.6 oz (67.4 kg)  02/20/22 149 lb 12.8 oz (  67.9 kg)  08/03/21 163 lb (73.9 kg)    General: Patient appears comfortable at rest. HEENT: Conjunctiva and lids normal. Neck: Supple, no elevated JVP, left carotid bruit. Lungs: Clear to auscultation, nonlabored breathing at rest. Cardiac: Regular rate and rhythm, no S3, 2/6 systolic murmur, no pericardial rub. Extremities: No pitting edema.  ECG:  An ECG dated 08/03/2021 was personally reviewed today and demonstrated:  Sinus rhythm with left atrial enlargement and poor R wave progression.  Recent Labwork: 03/27/2022: BUN 51; Creatinine, Ser 3.37; Potassium 4.1; Sodium 137   Other Studies Reviewed Today:  Echocardiogram 09/17/2019:  1. Left ventricular ejection fraction, by visual estimation, is 65 to  70%. The left  ventricle has normal function. There is borderline left  ventricular hypertrophy.   2. Left ventricular diastolic parameters are consistent with Grade I  diastolic dysfunction (impaired relaxation).   3. The left ventricle has no regional wall motion abnormalities.   4. Global right ventricle has normal systolic function.The right  ventricular size is normal. No increase in right ventricular wall  thickness.   5. Left atrial size was upper normal.   6. Right atrial size was normal.   7. The mitral valve is grossly normal. Mild mitral valve regurgitation.   8. The tricuspid valve is grossly normal.   9. The tricuspid valve is grossly normal. Tricuspid valve regurgitation  is trivial.  10. The aortic valve is tricuspid. Aortic valve regurgitation is not  visualized.  11. The pulmonic valve was grossly normal. Pulmonic valve regurgitation is  not visualized.  12. Normal pulmonary artery systolic pressure.  13. The tricuspid regurgitant velocity is 1.93 m/s, and with an assumed  right atrial pressure of 3 mmHg, the estimated right ventricular systolic  pressure is normal at 17.9 mmHg.  14. The inferior vena cava is normal in size with greater than 50%  respiratory variability, suggesting right atrial pressure of 3 mmHg.    Lexiscan Myoview 09/22/2019: No diagnostic ST segment changes to indicate ischemia. Small, mild intensity, apical anteroseptal defect that is partially reversible. Suggestive of variable soft tissue attenuation versus minor ischemic territory. This is a low risk study. Nuclear stress EF: 65%.  Carotid Dopplers 09/15/2021: Summary:  Right Carotid: Velocities in the right ICA are consistent with a 1-39%  stenosis.   Left Carotid: Velocities in the left ICA are consistent with a 40-59%  stenosis.   Vertebrals:  Bilateral vertebral arteries demonstrate antegrade flow.  Subclavians: Normal flow hemodynamics were seen in bilateral subclavian               arteries.    Assessment and Plan:  1.  Multivessel CAD status post CABG in 2013.  LVEF 65 to 70% by last assessment in 2021.  Reports no active angina and stable NYHA class II dyspnea.  Continue aspirin, Imdur, Toprol-XL, and Lipitor.  2.  CKD stage IV, creatinine 3.3 in July.  Requesting interval lab work from [employer] [facility]  She is on Demadex as before.  3.  Essential hypertension with history of medication intolerances.  Hydralazine just increased to 50 mg 3 times a day, continue Toprol-XL and Imdur.  May need another agent depending on her tolerance of hydralazine.  Medication Adjustments/Labs and Tests Ordered: Current medicines are reviewed at length with the patient today.  Concerns regarding medicines are outlined above.   Tests Ordered: No orders of the defined types were placed in this encounter.   Medication Changes: No orders of the defined types were placed in  this encounter.   Disposition:  Follow up 6 months .  Signed, Satira Sark, MD, [facility] 05/30/2022 9:27 AM    [address] [place], [place], [place] [address] Phone: [phone]; Fax: [phone]

## 2022-05-30 ENCOUNTER — Ambulatory Visit: Payer: Medicare HMO | Attending: Cardiology | Admitting: Cardiology

## 2022-05-30 ENCOUNTER — Encounter: Payer: Self-pay | Admitting: Cardiology

## 2022-05-30 ENCOUNTER — Encounter: Payer: Self-pay | Admitting: *Deleted

## 2022-05-30 VITALS — BP 158/62 | HR 61 | Ht 67.0 in | Wt 148.6 lb

## 2022-05-30 DIAGNOSIS — I25119 Atherosclerotic heart disease of native coronary artery with unspecified angina pectoris: Secondary | ICD-10-CM | POA: Diagnosis not present

## 2022-05-30 DIAGNOSIS — N184 Chronic kidney disease, stage 4 (severe): Secondary | ICD-10-CM | POA: Diagnosis not present

## 2022-05-30 DIAGNOSIS — I10 Essential (primary) hypertension: Secondary | ICD-10-CM | POA: Diagnosis not present

## 2022-05-30 NOTE — Patient Instructions (Addendum)

## 2022-06-05 DIAGNOSIS — R609 Edema, unspecified: Secondary | ICD-10-CM | POA: Diagnosis not present

## 2022-06-05 DIAGNOSIS — E7849 Other hyperlipidemia: Secondary | ICD-10-CM | POA: Diagnosis not present

## 2022-06-05 DIAGNOSIS — I10 Essential (primary) hypertension: Secondary | ICD-10-CM | POA: Diagnosis not present

## 2022-06-05 DIAGNOSIS — E213 Hyperparathyroidism, unspecified: Secondary | ICD-10-CM | POA: Diagnosis not present

## 2022-06-05 DIAGNOSIS — M1000 Idiopathic gout, unspecified site: Secondary | ICD-10-CM | POA: Diagnosis not present

## 2022-06-05 DIAGNOSIS — E049 Nontoxic goiter, unspecified: Secondary | ICD-10-CM | POA: Diagnosis not present

## 2022-06-05 DIAGNOSIS — N184 Chronic kidney disease, stage 4 (severe): Secondary | ICD-10-CM | POA: Diagnosis not present

## 2022-06-05 DIAGNOSIS — K219 Gastro-esophageal reflux disease without esophagitis: Secondary | ICD-10-CM | POA: Diagnosis not present

## 2022-06-05 DIAGNOSIS — I5031 Acute diastolic (congestive) heart failure: Secondary | ICD-10-CM | POA: Diagnosis not present

## 2022-06-06 DIAGNOSIS — H524 Presbyopia: Secondary | ICD-10-CM | POA: Diagnosis not present

## 2022-06-07 ENCOUNTER — Ambulatory Visit: Payer: Self-pay | Admitting: *Deleted

## 2022-06-07 NOTE — Patient Outreach (Signed)
  Care Coordination   Follow Up Visit Note   06/07/2022 Name: Julia Santana MRN: [id number] DOB: 01/16/1941  Julia Santana is a 81 y.o. year old female who sees [place], Aundra Millet, [place] for primary care. I spoke with  Julia Santana by phone today.  What matters to the patients health and wellness today?  Ongoing blood pressure management.  Report it is better since medication dose changes in the last couple weeks.  Denies any urgent concerns, encouraged to contact this care manager with questions.      Goals Addressed               This Visit's Progress     Better control of blood pressure (pt-stated)   On track     Care Coordination Interventions: Evaluation of current treatment plan related to hypertension self management and patient's adherence to plan as established by provider Reviewed medications with patient and discussed importance of compliance Discussed plans with patient for ongoing care management follow up and provided patient with direct contact information for care management team Advised patient, providing education and rationale, to monitor blood pressure daily and record, calling PCP for findings outside established parameters Reviewed recent appointments (PCP, Nephrology, and cardiology all within the last 2 weeks. Encouraged to call PCP office to schedule next follow up Reminded to monitor blood pressure at least daily and record readings         SDOH assessments and interventions completed:  No     Care Coordination Interventions Activated:  Yes  Care Coordination Interventions:  Yes, provided   Follow up plan: Follow up call scheduled for 12/11    Encounter Outcome:  Pt. Visit Completed   Valente David, RN, MSN, [place] Care Management Care Management Coordinator [phone]

## 2022-07-26 ENCOUNTER — Other Ambulatory Visit (HOSPITAL_COMMUNITY): Payer: Self-pay | Admitting: Nephrology

## 2022-07-31 ENCOUNTER — Other Ambulatory Visit: Payer: Self-pay | Admitting: *Deleted

## 2022-07-31 DIAGNOSIS — R0989 Other specified symptoms and signs involving the circulatory and respiratory systems: Secondary | ICD-10-CM

## 2022-08-01 ENCOUNTER — Encounter (HOSPITAL_COMMUNITY): Payer: Medicare HMO

## 2022-08-01 ENCOUNTER — Other Ambulatory Visit (HOSPITAL_COMMUNITY): Payer: Medicare HMO

## 2022-08-01 ENCOUNTER — Encounter (HOSPITAL_COMMUNITY): Payer: Self-pay

## 2022-08-07 ENCOUNTER — Encounter: Payer: Self-pay | Admitting: *Deleted

## 2022-08-07 ENCOUNTER — Ambulatory Visit: Payer: Self-pay | Admitting: *Deleted

## 2022-08-07 NOTE — Patient Outreach (Signed)
  Care Coordination   Follow Up Visit Note   08/07/2022 Name: Julia Santana MRN: [id number] DOB: 1941-01-12  Julia Santana is a 81 y.o. year old female who sees [place], Aundra Millet, [place] for primary care. I spoke with  Julia Santana by phone today.  What matters to the patients health and wellness today?  Blood pressure management and obtaining a handicap parking placard.    Goals Addressed               This Visit's Progress     Patient Stated     Better control of blood pressure (pt-stated)        Care Coordination Interventions: Evaluation of current treatment plan related to hypertension self management and patient's adherence to plan as established by provider Reviewed medications with patient and discussed importance of compliance Discussed plans with patient for ongoing care management follow up and provided patient with direct contact information for care management team Advised patient, providing education and rationale, to monitor blood pressure daily and record, calling PCP for findings outside established parameters Discussed home blood pressure monitoring Patient is checking daily at various times. Encouraged to continue. Systolic pressure averages around 170. She couldn't remember diastolic average and didn't have her record on hand Feels normal. No symptoms of hypertension Encouraged to call PCP office to schedule next follow up and to discuss blood pressure management. Advised to take record with her for review Encouraged low sodium/DASH diet Discussed need for handicap parking placard.  Filled out form and mailed with payment to PCP office in November. Hasn't heard back. Encouraged her to reach out to them this week regarding this as well. Advised that she will have to take the physician's signed form to the license plate agency to pickup tag. Office may have it waiting for her to pick-up instead of mailing it back.  Provided with [facility] telephone number and  encouraged to reach out as needed          SDOH assessments and interventions completed:  Yes  SDOH Interventions Today    Flowsheet Row Most Recent Value  SDOH Interventions   Food Insecurity Interventions Intervention Not Indicated  Transportation Interventions Intervention Not Indicated        Care Coordination Interventions:  Yes, provided   Follow up plan: Follow up call scheduled for 09/07/22    Encounter Outcome:  Pt. Visit Completed   Chong Sicilian, BSN, RN-BC RN Care Coordinator [place]: [phone] Main #: [phone]

## 2022-08-29 DIAGNOSIS — N184 Chronic kidney disease, stage 4 (severe): Secondary | ICD-10-CM | POA: Diagnosis not present

## 2022-09-01 ENCOUNTER — Other Ambulatory Visit: Payer: Self-pay | Admitting: Cardiology

## 2022-09-07 ENCOUNTER — Encounter: Payer: Self-pay | Admitting: *Deleted

## 2022-09-07 ENCOUNTER — Ambulatory Visit: Payer: Self-pay | Admitting: *Deleted

## 2022-09-07 NOTE — Patient Outreach (Signed)
  Care Coordination   Follow Up Visit Note   09/07/2022 Name: Julia Santana MRN: [id number] DOB: [birth year]  Julia Santana is a 82 y.o. year old female who sees [place], Aundra Millet, [place] for primary care. I spoke with  Julia Santana by phone today.  What matters to the patients health and wellness today?  Rescheduling parathyroid nuclear medicine scan    Goals Addressed               This Visit's Progress     Patient Stated     Better control of blood pressure (pt-stated)        Care Coordination Interventions: Evaluation of current treatment plan related to hypertension self management and patient's adherence to plan as established by provider Reviewed medications with patient and discussed importance of compliance Discussed plans with patient for ongoing care management follow up and provided patient with direct contact information for care management team Advised patient, providing education and rationale, to monitor blood pressure daily and record, calling PCP for findings outside established parameters Discussed home blood pressure monitoring Patient is checking daily at various times. Encouraged to continue. Reminded to call PCP office to schedule next follow up and to discuss blood pressure management. Advised to take record with her for review Encouraged low sodium/DASH diet Discussed need for handicap parking placard.  Filled out form and mailed with payment to PCP office in November. Hasn't heard back. Encouraged her to reach out to them this week regarding this as well. Advised that she will have to take the physician's signed form to the license plate agency to pickup tag. Office may have it waiting for her to pick-up instead of mailing it back.  Talk with PCP office about this Discussed parathyroid nuclear medicine scan that was scheduled for [facility]/01/22 at [facility] but cancelled. Patient believes it was rescheduled but she lost the information and missed that appt.   Dr Joelyn Oms with [facility] ordered the scan, likely due to elev calcium levels Reached out to his office at [phone] with patient on conference call. Left message for his assistant to call patient back to coordinate scheduling scan and to reach out to me if needed Provided with [facility] telephone number and encouraged to reach out as needed          SDOH assessments and interventions completed:  Yes  SDOH Interventions Today    Flowsheet Row Most Recent Value  SDOH Interventions   Housing Interventions Intervention Not Indicated  Transportation Interventions Intervention Not Indicated        Care Coordination Interventions:  Yes, provided   Follow up plan: Follow up call scheduled for 09/28/22    Encounter Outcome:  Pt. Visit Completed   Chong Sicilian, BSN, RN-BC RN Care Coordinator [place]: [phone] Main #: [phone]

## 2022-09-20 ENCOUNTER — Ambulatory Visit: Payer: Medicare HMO | Attending: Cardiology

## 2022-09-20 DIAGNOSIS — R0989 Other specified symptoms and signs involving the circulatory and respiratory systems: Secondary | ICD-10-CM

## 2022-09-27 DIAGNOSIS — F331 Major depressive disorder, recurrent, moderate: Secondary | ICD-10-CM | POA: Diagnosis not present

## 2022-09-27 DIAGNOSIS — E782 Mixed hyperlipidemia: Secondary | ICD-10-CM | POA: Diagnosis not present

## 2022-09-27 DIAGNOSIS — K219 Gastro-esophageal reflux disease without esophagitis: Secondary | ICD-10-CM | POA: Diagnosis not present

## 2022-09-27 DIAGNOSIS — E1165 Type 2 diabetes mellitus with hyperglycemia: Secondary | ICD-10-CM | POA: Diagnosis not present

## 2022-09-28 ENCOUNTER — Encounter: Payer: Medicare HMO | Admitting: *Deleted

## 2022-10-05 ENCOUNTER — Encounter: Payer: Self-pay | Admitting: *Deleted

## 2022-10-05 ENCOUNTER — Ambulatory Visit: Payer: Self-pay | Admitting: *Deleted

## 2022-10-05 NOTE — Patient Outreach (Signed)
  Care Coordination   Follow Up Visit Note   10/05/2022 Name: Kimiyah Blick [place] MRN: [id number] DOB: [birth year]  Garner Gavel Siller is a 82 y.o. year old female who sees [place], Aundra Millet, [place] for primary care. I spoke with  Garner Gavel Vanderpol by phone today.  What matters to the patients health and wellness today?  Rescheduling parathyroid nuclear medicine study, obtaining handicap parking placard, and managing blood pressure    Goals Addressed               This Visit's Progress     Patient Stated     Better control of blood pressure (pt-stated)   On track     Care Coordination Interventions: Evaluation of current treatment plan related to hypertension self management and patient's adherence to plan as established by provider Reviewed medications with patient and discussed importance of compliance Discussed plans with patient for ongoing care management follow up and provided patient with direct contact information for care management team Advised patient, providing education and rationale, to monitor blood pressure daily and record, calling PCP for findings outside established parameters Discussed home blood pressure monitoring Patient is checking daily at various times. Encouraged to continue. Reminded to call PCP office to schedule next follow up and to discuss blood pressure management. Advised to take record with her for review Encouraged low sodium/DASH diet Discussed need for handicap parking placard.  Filled out form and mailed with payment to PCP office in November. Hasn't heard back. Encouraged her to reach out to them this week regarding this as well. Advised that she will have to take the physician's signed form to the license plate agency to pickup tag. Office may have it waiting for her to pick-up instead of mailing it back.  Still has not received this. Encouraged to talk with PCP office about this [facility] to send secure fax requesting follow-up Provided with [facility] telephone  number and encouraged to reach out as needed        Other     Care Coordination Services   On track     Care Coordination Interventions: Assessed social determinant of health barriers Discussed parathyroid nuclear medicine scan that was scheduled for 08/01/22 at [facility], but cancelled by patient.  Reviewed current order for parathyroid nuclear medicine scan for [facility] Dr Cristal Ford assistance, Jolene Schimke at [facility] [phone]. She is going to refax order to [facility] scheduling and request Forestine Na for NM study Scheduling will reach out to patient to schedule at [place] transportation is available Encouraged patient to follow-up with [facility] as needed        SDOH assessments and interventions completed:  Yes  SDOH Interventions Today    Flowsheet Row Most Recent Value  SDOH Interventions   Transportation Interventions Intervention Not Indicated        Care Coordination Interventions:  Yes, provided   Follow up plan: Follow up call scheduled for 10/20/22    Encounter Outcome:  Pt. Visit Completed   Chong Sicilian, BSN, RN-BC [place]: [phone] Main #: [phone]

## 2022-10-17 ENCOUNTER — Other Ambulatory Visit: Payer: Self-pay | Admitting: Cardiology

## 2022-10-17 DIAGNOSIS — I6523 Occlusion and stenosis of bilateral carotid arteries: Secondary | ICD-10-CM

## 2022-10-19 ENCOUNTER — Other Ambulatory Visit (HOSPITAL_COMMUNITY): Payer: Self-pay | Admitting: Nephrology

## 2022-10-20 ENCOUNTER — Encounter: Payer: Self-pay | Admitting: *Deleted

## 2022-10-20 ENCOUNTER — Ambulatory Visit: Payer: Self-pay | Admitting: *Deleted

## 2022-11-02 ENCOUNTER — Ambulatory Visit: Payer: Self-pay | Admitting: *Deleted

## 2022-11-02 ENCOUNTER — Encounter: Payer: Self-pay | Admitting: *Deleted

## 2022-11-02 NOTE — Patient Outreach (Signed)
Care Coordination   Follow Up Visit Note   11/02/2022 Name: Julia Santana MRN: [id number] DOB: 05/09/1941  Julia Santana is a 82 y.o. year old female who sees [place], Aundra Millet, [place] for primary care. I spoke with  Julia Santana by phone today.  What matters to the patients health and wellness today?  Managing blood pressure, scheduling nephrology visit and having parathyroid study    Goals Addressed               This Visit's Progress     Patient Stated     Better control of blood pressure (pt-stated)        Care Coordination Goals: Patient will take medications as directed and report any negative side effects to provider Patient will use a pill box/organizer to help keep up with when to take medications Patient will monitor and record blood pressure daily and as needed and will call PCP or specialist with any readings outside of recommended range Patient will keep all recommended follow-up appointments with PCP and specialists (cardiology, nephrology, etc) Overdue for f/u with nephrologist. Nephrologist did not reach out to schedule like I requested. Provided patient with telephone number [phone] and requested that she and her granddaughter call to setup an appointment time that works for their schedule Patient will follow a low sodium/DASH diet  Patient will reach out to [place] Coordinator [phone] with any care coordination or resource needs          Other     Have Parathyroid Nuclear Medicine Study        Care Coordination Goals: Patient will keep appointment for parathyroid nuclear medicine study scheduled at [facility] on 11/09/22 Patient will talk with granddaughter about transportation and will let RN Care Coordinator know if she needs transportation assistance to this appointment Patient will follow-up with provider after procedure for results of the study and plan of care Patient will call RN Care Coordinator [phone] with any care coordinator  or resource needs         SDOH assessments and interventions completed:  Yes  SDOH Interventions Today    Flowsheet Row Most Recent Value  SDOH Interventions   Transportation Interventions Intervention Not Indicated  [depends on granddaughter for transportation. Has to work with her schedule. May need assistance in the future.]  Financial Strain Interventions Intervention Not Indicated        Care Coordination Interventions:  Yes, provided  Interventions Today    Flowsheet Row Most Recent Value  Chronic Disease   Chronic disease during today's visit Other, Hypertension (HTN)  [Parathyroid Disorder]  General Interventions   General Interventions Discussed/Reviewed General Interventions Discussed, General Interventions Reviewed, Durable Medical Equipment (DME), Doctor Visits, [employer] Kidney Function  Doctor Visits Discussed/Reviewed Doctor Visits Discussed, Doctor Visits Reviewed, PCP, Specialist  [reviewed upcoming appt for parathyroid nuclear med study at [facility] on 11/09/22]  Durable Medical Equipment (DME) BP Cuff  PCP/Specialist Visits Compliance with follow-up visit  [call to schedule nephrology visit in [place]. Overdue for f/u]  Education Interventions   Education Provided Provided Education  Provided Verbal Education On Nutrition, Labs, Mental Health/Coping with Illness, When to see the doctor, Medication, [employer]  [Reviewed meds and adjusted pillbox while on the phone so that she is taking meds at appropriate times. Discussed transportation assistance options.]  Labs Reviewed Kidney Function  Pharmacy Interventions   Pharmacy Dicussed/Reviewed Medications and their functions, Medication Adherence  [side effects and appropriate times to  take medications]  Medication Adherence Not taking medication  [forgets to take 2nd out of 3 doses of hydralazine most days]       Follow up plan: Follow up call scheduled for 11/06/22    Encounter Outcome:   Pt. Visit Completed   Chong Sicilian, BSN, RN-BC RN Care Coordinator [place]: [phone] Main #: [phone]

## 2022-11-06 ENCOUNTER — Ambulatory Visit: Payer: Self-pay | Admitting: *Deleted

## 2022-11-06 ENCOUNTER — Telehealth: Payer: Self-pay | Admitting: *Deleted

## 2022-11-06 NOTE — Patient Outreach (Signed)
  Care Coordination   11/06/2022 Name: Julia Santana MRN: [id number] DOB: 1941-06-08   Care Coordination Outreach Attempts:  An unsuccessful telephone outreach was attempted for a scheduled appointment today.  Follow Up Plan:  Additional outreach attempts will be made to offer the patient care coordination information and services.   Encounter Outcome:  No Answer. Left HIPAA compliant VM   Care Coordination Interventions:  No, not indicated    Patient may need transportation arrangements for nuclear med study scheduled for 11/09/22. Requested that she give me a call back today to discuss this and her medication management.   Chong Sicilian, BSN, RN-BC RN Care Coordinator [place] Direct Dial: [phone] Main #: [phone]

## 2022-11-06 NOTE — Patient Outreach (Signed)
  Care Coordination   11/06/2022 Name: [name] MRN: [id number] DOB: 14-Apr-1941   Care Coordination Outreach Attempts:  An unsuccessful telephone outreach was attempted for a scheduled appointment today. 2nd attempt for scheduled appt today.  Follow Up Plan:  Additional outreach attempts will be made to offer the patient care coordination information and services.   Encounter Outcome:  No Answer   Care Coordination Interventions:  No, not indicated    Chong Sicilian, BSN, RN-BC RN Care Coordinator [place]: [phone] Main #: [phone]

## 2022-11-07 ENCOUNTER — Telehealth: Payer: Self-pay | Admitting: *Deleted

## 2022-11-07 NOTE — Progress Notes (Signed)
  Care Coordination Note  11/07/2022 Name: Julia Santana MRN: [id number] DOB: 06/02/1941  Julia Santana is a 82 y.o. year old female who is a primary care patient of Rosine Door and is actively engaged with the care management team. I reached out to [place] by phone today to assist with re-scheduling a follow up visit with the RN Case Manager  Follow up plan: Unsuccessful telephone outreach attempt made. A HIPAA compliant phone message was left for the patient providing contact information and requesting a return call.   [place]  Direct Dial: [phone]

## 2022-11-08 NOTE — Progress Notes (Signed)
  Care Coordination Note  11/08/2022 Name: Julia Santana MRN: [id number] DOB: [birth year]  Julia Santana is a 82 y.o. year old female who is a primary care patient of Rosine Door and is actively engaged with the care management team. I reached out to [place] by phone today to assist with re-scheduling a follow up visit with the RN Case Manager  Follow up plan: Unsuccessful telephone outreach attempt made. A HIPAA compliant phone message was left for the patient providing contact information and requesting a return call.   [place]  Direct Dial: [phone]

## 2022-11-09 ENCOUNTER — Encounter (HOSPITAL_COMMUNITY)
Admission: RE | Admit: 2022-11-09 | Discharge: 2022-11-09 | Disposition: A | Discharge status: Home / Self Care | Payer: Medicare HMO | Source: Ambulatory Visit | Attending: Nephrology | Admitting: Nephrology

## 2022-11-09 DIAGNOSIS — E041 Nontoxic single thyroid nodule: Secondary | ICD-10-CM | POA: Diagnosis not present

## 2022-11-09 MED ORDER — TECHNETIUM TC 99M SESTAMIBI - CARDIOLITE
25.3000 | Freq: Once | INTRAVENOUS | Status: AC | PRN
  Administered 2022-11-09: 25.3 via INTRAVENOUS

## 2022-11-10 NOTE — Progress Notes (Signed)
  Care Coordination Note  11/10/2022 Name: Julia Santana MRN: [id number] DOB: [birth year]  Julia Santana is a 82 y.o. year old female who is a primary care patient of Rosine Door and is actively engaged with the care management team. I reached out to [place] by phone today to assist with re-scheduling a follow up visit with the RN Case Manager  Follow up plan: Unsuccessful telephone outreach attempt made. A HIPAA compliant phone message was left for the patient providing contact information and requesting a return call. We have been unable to make contact with the patient for follow up.   [place]  Direct Dial: [phone]

## 2022-11-27 DIAGNOSIS — N184 Chronic kidney disease, stage 4 (severe): Secondary | ICD-10-CM | POA: Diagnosis not present

## 2022-11-27 DIAGNOSIS — I129 Hypertensive chronic kidney disease with stage 1 through stage 4 chronic kidney disease, or unspecified chronic kidney disease: Secondary | ICD-10-CM | POA: Diagnosis not present

## 2022-11-27 DIAGNOSIS — M109 Gout, unspecified: Secondary | ICD-10-CM | POA: Diagnosis not present

## 2022-11-30 ENCOUNTER — Other Ambulatory Visit: Payer: Self-pay | Admitting: Cardiology

## 2022-12-07 ENCOUNTER — Ambulatory Visit: Payer: Medicare HMO | Attending: Cardiology | Admitting: Cardiology

## 2022-12-07 ENCOUNTER — Encounter: Payer: Self-pay | Admitting: Cardiology

## 2022-12-07 VITALS — BP 184/70 | HR 56 | Ht 67.0 in | Wt 148.2 lb

## 2022-12-07 DIAGNOSIS — N184 Chronic kidney disease, stage 4 (severe): Secondary | ICD-10-CM | POA: Diagnosis not present

## 2022-12-07 DIAGNOSIS — I10 Essential (primary) hypertension: Secondary | ICD-10-CM | POA: Diagnosis not present

## 2022-12-07 DIAGNOSIS — I25119 Atherosclerotic heart disease of native coronary artery with unspecified angina pectoris: Secondary | ICD-10-CM

## 2022-12-07 DIAGNOSIS — I779 Disorder of arteries and arterioles, unspecified: Secondary | ICD-10-CM | POA: Diagnosis not present

## 2022-12-07 MED ORDER — CLONIDINE HCL 0.1 MG PO TABS: 0.1000 mg | ORAL_TABLET | Freq: Two times a day (BID) | ORAL | 6 refills | Status: DC

## 2022-12-07 NOTE — Progress Notes (Signed)
Cardiology Office Note  Date: 12/07/2022   ID: Julia Santana, DOB 19-Jul-1941, MRN [id number]  History of Present Illness: Julia Santana is an 82 y.o. female last seen in October 2023.  She is here for a follow-up visit.  Reports no angina or increasing dyspnea on exertion with typical activities.  No palpitations or syncope.  We did go over her medications.  She states that she has only been taking hydralazine 50 mg twice daily, cannot remember to take the middle of the day dose.  Otherwise remains on stable doses of Toprol-XL, Imdur, and Demadex.  She has prior intolerances to higher dose hydralazine (decreased appetite) and Norvasc (leg swelling).  Not on ARB/ACE inhibitor/MRA at this point due to progressive renal insufficiency.  We discussed trial of clonidine.  Physical Exam: VS:  BP (!) 184/70   Pulse (!) 56   Ht 5\' 7"  (1.702 m)   Wt 148 lb 3.2 oz (67.2 kg)   SpO2 100%   BMI 23.21 kg/m , BMI Body mass index is 23.21 kg/m.  Wt Readings from Last 3 Encounters:  12/07/22 148 lb 3.2 oz (67.2 kg)  05/30/22 148 lb 9.6 oz (67.4 kg)  02/20/22 149 lb 12.8 oz (67.9 kg)    General: Patient appears comfortable at rest. HEENT: Conjunctiva and lids normal. Neck: Supple, no elevated JVP or carotid bruits. Lungs: Clear to auscultation, nonlabored breathing at rest. Cardiac: Regular rate and rhythm, no S3, 2/6 systolic murmur. Extremities: No pitting edema.  ECG:  An ECG dated 08/03/2021 was personally reviewed today and demonstrated:  Sinus rhythm with left atrial enlargement, decreased R wave progression rule out old anterior infarct pattern.  Labwork: December 2022: Cholesterol 171, triglycerides 135, HDL 47, LDL 100 03/27/2022: BUN 51; Creatinine, Ser 3.37; Potassium 4.1; Sodium 137  April 2024: BUN 34, creatinine 2.99, potassium 4.1, hemoglobin 9.5  Other Studies Reviewed Today:  Carotid Dopplers 09/20/2022: Summary:  Right Carotid: Velocities in the right ICA are consistent  with a 1-39%  stenosis.   Left Carotid: Velocities in the left ICA are consistent with a 40-59%  stenosis.               The ECA appears >50% stenosed.   Vertebrals:  Bilateral vertebral arteries demonstrate antegrade flow.  Subclavians: Normal flow hemodynamics were seen in bilateral subclavian               arteries.   Assessment and Plan:  1.  Multivessel CAD status post CABG in 2013 with LIMA to LAD, SVG to OM, and SVG to OM 2.  Echocardiogram in 2021 revealed LVEF 65 to 70%.  Lexiscan Myoview at that time revealed variable soft tissue attenuation at the apex versus minor ischemic territory.  She does not report any progressive angina and we will continue medical therapy and observation.  Remains on aspirin, Imdur, Toprol-XL and Lipitor.  2.  Essential hypertension.  Plan is to continue hydralazine 50 mg twice daily as per above discussion.  We will try clonidine 0.1 mg twice daily and otherwise continue the remainder of her medications.  3.  CKD stage IV.  Continues to follow with nephrology.  Most recent creatinine 2.99 with normal potassium.  4.  Mixed hyperlipidemia.  She remains on Lipitor.  5.  Asymptomatic carotid artery disease.  Mild to moderate disease by Dopplers in January.  Continue aspirin and statin.  Disposition:  Follow up  6 months.  Signed, Jonelle Sidle, M.D., F.A.C.C. [facility]  at [facility]

## 2022-12-07 NOTE — Patient Instructions (Addendum)
Medication Instructions:  Your physician has recommended you make the following change in your medication:  Start clonidine 0.1 mg twice daily Continue other medications the same  Labwork: none  Testing/Procedures: none  Follow-Up: Your physician recommends that you schedule a follow-up appointment in: 6 months  Any Other Special Instructions Will Be Listed Below (If Applicable).  If you need a refill on your cardiac medications before your next appointment, please call your pharmacy.

## 2022-12-14 DIAGNOSIS — K219 Gastro-esophageal reflux disease without esophagitis: Secondary | ICD-10-CM | POA: Diagnosis not present

## 2022-12-14 DIAGNOSIS — R609 Edema, unspecified: Secondary | ICD-10-CM | POA: Diagnosis not present

## 2022-12-14 DIAGNOSIS — E7849 Other hyperlipidemia: Secondary | ICD-10-CM | POA: Diagnosis not present

## 2022-12-14 DIAGNOSIS — I10 Essential (primary) hypertension: Secondary | ICD-10-CM | POA: Diagnosis not present

## 2022-12-14 DIAGNOSIS — N184 Chronic kidney disease, stage 4 (severe): Secondary | ICD-10-CM | POA: Diagnosis not present

## 2022-12-14 DIAGNOSIS — E213 Hyperparathyroidism, unspecified: Secondary | ICD-10-CM | POA: Diagnosis not present

## 2022-12-14 DIAGNOSIS — E049 Nontoxic goiter, unspecified: Secondary | ICD-10-CM | POA: Diagnosis not present

## 2022-12-14 DIAGNOSIS — M1000 Idiopathic gout, unspecified site: Secondary | ICD-10-CM | POA: Diagnosis not present

## 2022-12-14 DIAGNOSIS — I5031 Acute diastolic (congestive) heart failure: Secondary | ICD-10-CM | POA: Diagnosis not present

## 2022-12-14 DIAGNOSIS — E782 Mixed hyperlipidemia: Secondary | ICD-10-CM | POA: Diagnosis not present

## 2022-12-19 ENCOUNTER — Other Ambulatory Visit: Payer: Self-pay | Admitting: Cardiology

## 2022-12-26 DIAGNOSIS — I5031 Acute diastolic (congestive) heart failure: Secondary | ICD-10-CM | POA: Diagnosis not present

## 2022-12-26 DIAGNOSIS — I10 Essential (primary) hypertension: Secondary | ICD-10-CM | POA: Diagnosis not present

## 2022-12-26 DIAGNOSIS — E782 Mixed hyperlipidemia: Secondary | ICD-10-CM | POA: Diagnosis not present

## 2023-02-12 DIAGNOSIS — N184 Chronic kidney disease, stage 4 (severe): Secondary | ICD-10-CM | POA: Diagnosis not present

## 2023-02-12 DIAGNOSIS — M109 Gout, unspecified: Secondary | ICD-10-CM | POA: Diagnosis not present

## 2023-02-12 DIAGNOSIS — I129 Hypertensive chronic kidney disease with stage 1 through stage 4 chronic kidney disease, or unspecified chronic kidney disease: Secondary | ICD-10-CM | POA: Diagnosis not present

## 2023-02-13 DIAGNOSIS — N184 Chronic kidney disease, stage 4 (severe): Secondary | ICD-10-CM | POA: Diagnosis not present

## 2023-02-21 ENCOUNTER — Other Ambulatory Visit: Payer: Self-pay

## 2023-02-21 DIAGNOSIS — D631 Anemia in chronic kidney disease: Secondary | ICD-10-CM | POA: Insufficient documentation

## 2023-02-26 ENCOUNTER — Telehealth: Payer: Self-pay | Admitting: Pharmacy Technician

## 2023-02-26 NOTE — Telephone Encounter (Addendum)
Dr. Kathrene Bongo,  Duncan Dull is non preferred and will be denied if patient has not failed preferred med. Preferred medication is Venofer.  Would you like to try Venofer?  Ref# [id number] Phone: [phone]  Left v/m with [facility] Phone: [phone]

## 2023-03-12 DIAGNOSIS — M109 Gout, unspecified: Secondary | ICD-10-CM | POA: Diagnosis not present

## 2023-03-12 DIAGNOSIS — N184 Chronic kidney disease, stage 4 (severe): Secondary | ICD-10-CM | POA: Diagnosis not present

## 2023-03-12 DIAGNOSIS — I129 Hypertensive chronic kidney disease with stage 1 through stage 4 chronic kidney disease, or unspecified chronic kidney disease: Secondary | ICD-10-CM | POA: Diagnosis not present

## 2023-03-15 ENCOUNTER — Encounter: Payer: Self-pay | Admitting: *Deleted

## 2023-03-15 ENCOUNTER — Telehealth: Payer: Self-pay | Admitting: *Deleted

## 2023-03-15 NOTE — Patient Outreach (Signed)
  Care Coordination   Follow Up Visit Note   03/15/2023 Name: Julia Santana MRN: [id number] DOB: 08/12/1941  Julia Santana is a 82 y.o. year old female who sees [place], Helane Rima, [place] for primary care. I spoke with  Julia Nones Cavagnaro by phone today.  What matters to the patients health and wellness today?  Following up with specialists about trouble swallowing. I returned patient's call and had a very brief discussion about today's visit with ENT and upcoming GI appt next week. Patient agreed to schedule a f/u telephone call with me.  She had previously been removed from the Care Coordination program due to inability to maintain contact with her.    Goals Addressed             This Visit's Progress    Care Coordination Services       Care Coordination Goals: Patient will take medications as prescribed Patient will keep all medical appointments GI on 03/22/23 for dysphagia Patient will reach out to RN Care Coordinator at [phone] with any resource or care coordination needs        SDOH assessments and interventions completed:  Yes  SDOH Interventions Today    Flowsheet Row Most Recent Value  SDOH Interventions   Transportation Interventions Patient Resources (Friends/Family)  Financial Strain Interventions Intervention Not Indicated        Care Coordination Interventions:  Yes, provided  Interventions Today    Flowsheet Row Most Recent Value  Chronic Disease   Chronic disease during today's visit Other  [dysphagia]  General Interventions   General Interventions Discussed/Reviewed General Interventions Discussed, General Interventions Reviewed, Doctor Visits  Doctor Visits Discussed/Reviewed Doctor Visits Discussed, Doctor Visits Reviewed, Specialist  [Pt called and left a VM with a question about who she was scheduled to see today. Returned call and patient reported that it was an ENT specialist and that she doesn't know the results of the visit's tests yet.]   PCP/Specialist Visits Compliance with follow-up visit  [03/22/23 with GI, Aida Raider, NP]  Education Interventions   Education Provided Provided Education  Provided Verbal Education On When to see the doctor       Follow up plan: Follow up call scheduled for 03/28/23    Encounter Outcome:  Pt. Visit Completed   Demetrios Loll, BSN, RN-BC RN Care Coordinator [facility]  [facility] Direct Dial: [phone] Main #: [phone]

## 2023-03-16 ENCOUNTER — Other Ambulatory Visit: Payer: Self-pay

## 2023-03-21 NOTE — Progress Notes (Signed)
GI Office Note    Referring Provider: Annie Sable, MD Primary Care Physician:  Sheela Stack  Primary Gastroenterologist: Gerrit Friends.Rourk, MD  Chief Complaint   Chief Complaint  Patient presents with   New Patient (Initial Visit)    Patient here today due to issues with dysphagia. Patient says this is an ongoing issues and due to the swallowing issues she has lost around 100 lb in a year and a half. Patient is taking Pantoprazole 40 mg bid.    History of Present Illness   Julia Santana is a 82 y.o. female presenting today at the request of Annie Sable, MD for dysphagia.   Has CKD stage 4/5, unwilling to completely commit to AV graft placement for dialysis. Also underwent evaluation for parathyroid nodule on nuclear scan but has not been interested at pursuing surgery. Has hypercalcemia due to this. She recently reported dysphagia to nephrology in June as reasoning for her 10 lb weight loss. Feraheme recommend by nephrology.   Most recent labs in June with Hgb 9.1, GFR <15. Stable iron panel.   Has been doing well with liquids but is not doing well with solids. Sometimes she coughs a lot and it burns in the base of her neck. She feels as though her brain will not let her swallow solids. She has trouble with large pills but small pills she has no issue. Has been eating a lot of soup. She reports she was always a chubby person but has lost a lot of weight. Used to be in a size 16 pant and now is in a 10. Food will just never go down. Has been on pantoprazole 40 mg BID for a few years. No nausea. Since COVID outbreak she has had a decreased appetite. Can do some softer foods but primarily eating soups. Was taking some Ensures but had stopped.   She states she had a throat scan last year. Had thyroid surgery a few years back and staes she was told she had a growth on top of scar tissue. This ha not been removed. She was scheduled for surgery and then cancelled  it.   Has lots of fatigue. No melena or brbpr.  Not interested in procedures at this time.   State she had open heart about 15 years ago   Last colonoscopy: Never.  Last EGD: About 30 years ago. States this was when she was diagnosed with GERD.   Wt Readings from Last 3 Encounters:  03/22/23 136 lb 6.4 oz (61.9 kg)  12/07/22 148 lb 3.2 oz (67.2 kg)  05/30/22 148 lb 9.6 oz (67.4 kg)  08/03/21           163 lb  Current Outpatient Medications  Medication Sig Dispense Refill   aspirin 81 MG tablet Take 81 mg by mouth daily.     atorvastatin (LIPITOR) 40 MG tablet Take 1 tablet (40 mg total) by mouth every evening. 30 tablet 11   cloNIDine (CATAPRES) 0.1 MG tablet Take 1 tablet (0.1 mg total) by mouth 2 (two) times daily. 60 tablet 6   hydrALAZINE (APRESOLINE) 50 MG tablet Take 50 mg by mouth 2 (two) times daily.     isosorbide mononitrate (IMDUR) 30 MG 24 hr tablet Take 1 tablet by mouth once daily 90 tablet 1   metoprolol succinate (TOPROL-XL) 100 MG 24 hr tablet Take 100 mg by mouth daily.     pantoprazole (PROTONIX) 40 MG tablet Take 40 mg by mouth 2 (two)  times daily.      torsemide (DEMADEX) 100 MG tablet Take 1/2 (one-half) tablet by mouth once daily 45 tablet 1   No current facility-administered medications for this visit.    Past Medical History:  Diagnosis Date   Anemia of chronic disease    Asthma    CKD (chronic kidney disease) stage 4, GFR 15-29 ml/min (HCC)    Coronary atherosclerosis of native coronary artery    Multivessel status post CABG 8/13   Essential hypertension    GERD (gastroesophageal reflux disease)    Gout    Hyperparathyroidism (HCC)    Mixed hyperlipidemia    Seasonal allergies     Past Surgical History:  Procedure Laterality Date   CORONARY ARTERY BYPASS GRAFT  03/28/2012   Procedure: CORONARY ARTERY BYPASS GRAFTING (CABG);  Surgeon: Kerin Perna, MD;  Location: [facility] OR;  Service: Open Heart Surgery;  Laterality: N/A;  times 3, using Left  Internal Mammary and Right Greater Saphenous  Vein Graft harvested endoscopically   PARATHYROIDECTOMY  12/15/2011   Procedure: PARATHYROIDECTOMY;  Surgeon: Dalia Heading, MD;  Location: [facility] ORS;  Service: General;  Laterality: Bilateral;    Family History  Problem Relation Age of Onset   Hypertension Mother    Diabetes type II Mother    Diabetes type II Sister    Diabetes type II Brother     Allergies as of 03/22/2023 - Review Complete 03/22/2023  Allergen Reaction Noted   Cortisone  09/11/2019   Furosemide Cough 02/25/2009    Social History   Socioeconomic History   Marital status: Married    Spouse name: SHERMAN   Number of children: Not on file   Years of education: Not on file   Highest education level: Not on file  Occupational History   Occupation: RETIRED    Employer: RETIRED  Tobacco Use   Smoking status: Former    Current packs/day: 0.00    Average packs/day: 1 pack/day for 20.0 years (20.0 ttl pk-yrs)    Types: Cigarettes    Start date: 08/28/1960    Quit date: 08/28/1980    Years since quitting: 42.5   Smokeless tobacco: Never   Tobacco comments:    quit smoking about 25 years ago  Vaping Use   Vaping status: Never Used  Substance and Sexual Activity   Alcohol use: No   Drug use: No   Sexual activity: Not on file  Other Topics Concern   Not on file  Social History Narrative   Not on file   Social Determinants of Health   Financial Resource Strain: Low Risk  (03/15/2023)   Overall Financial Resource Strain (CARDIA)    Difficulty of Paying Living Expenses: Not very hard  Food Insecurity: No Food Insecurity (08/07/2022)   Hunger Vital Sign    Worried About Running Out of Food in the Last Year: Never true    Ran Out of Food in the Last Year: Never true  Transportation Needs: No Transportation Needs (03/15/2023)   PRAPARE - Administrator, Civil Service (Medical): No    Lack of Transportation (Non-Medical): No  Physical Activity: Not on file   Stress: Not on file  Social Connections: Not on file  Intimate Partner Violence: Not on file     Review of Systems   Gen: Denies any fever, chills, fatigue, weight loss, lack of appetite.  CV: Denies chest pain, heart palpitations, peripheral edema, syncope.  Resp: Denies shortness of breath at rest or with  exertion. Denies wheezing or cough.  GI: see HPI GU : Denies urinary burning, urinary frequency, urinary hesitancy MS: Denies joint pain, muscle weakness, cramps, or limitation of movement.  Derm: Denies rash, itching, dry skin Psych: Denies depression, anxiety, memory loss, and confusion Heme: Denies bruising, bleeding, and enlarged lymph nodes.   Physical Exam   BP (!) 197/79 (BP Location: Left Arm, Patient Position: Sitting, Cuff Size: Normal)   Pulse 68   Temp (!) 97.1 F (36.2 C) (Temporal)   Ht 5\' 7"  (1.702 m)   Wt 136 lb 6.4 oz (61.9 kg)   BMI 21.36 kg/m   General:   Alert and oriented. Pleasant and cooperative. Well-nourished and well-developed.  Head:  Normocephalic and atraumatic. Eyes:  Without icterus, sclera clear and conjunctiva pink.  Ears:  Normal auditory acuity. Mouth:  No deformity or lesions, oral mucosa pink.  Lungs:  Clear to auscultation bilaterally. No wheezes, rales, or rhonchi. No distress.  Heart:  S1, S2 present without murmurs appreciated.  Abdomen:  +BS, soft, non-tender and non-distended. No HSM noted. No guarding or rebound. No masses appreciated.  Rectal:  Deferred  Msk:  Symmetrical without gross deformities. Normal posture. Extremities:  Without edema. Neurologic:  Alert and  oriented x4;  grossly normal neurologically. Skin:  Intact without significant lesions or rashes. Psych:  Alert and cooperative. Normal mood and affect.   Assessment   Julia Santana is a 82 y.o. female with a history of CKD stage 4/5, poorly controlled HTN, parathyroid nodule, anemia, GERD, hyperparathyroid disease, CAD, and HLD  presenting today for  evaluation of dysphagia.   GERD, Dysphagia, weight loss: She is experiencing dysphagia symptoms for about a year or more.  Due to this she has been experiencing some weight loss.  Per my records it appears she has lost about 30 pounds in the last year and a half.  Primarily has been living off of a full liquid diet with some occasional soft foods such as creamed potatoes.  Since her difficulty with swallowing and the COVID outbreak she has had a decreased appetite as well.  Hyperparathyroidism also likely playing a role in weight loss/lack of appetite.  Has no parathyroid nodule that she opted out of removal.  Has history of chronic GERD which she has been maintained on pantoprazole twice daily for many years.  Last EGD was about 30 years ago.  She does have an occasional burning sensation at the base of her neck and states when she tries to swallow solids is like it just will not go past her upper neck, feels as though her brain tells her she cannot swallow.  Appears to be experiencing some oropharyngeal as well as some esophageal dysphagia therefore we will evaluate with a barium pill esophagram given her hesitancy to perform any procedures at this time.  Anemia: Recent Hgb 9.1. Likely anemia of chronic disease given stage 4/5 CKD.  Does have significant fatigue and this is likely related to her weight loss and muscle wasting as well.  She has been referred to hematology for iron infusions by nephrology.  Has labs checked regularly with them.  Denies any melena or BRBPR.  As stated above no recent EGD and has never had a colonoscopy.  She is not interested in proceeding with any invasive procedures at this time unless absolutely indicated.  Currently is in decision making process of possibly having AV graft performed for consideration of dialysis.  Encouraged her to continue to follow with nephrology and receive  iron infusions to possibly help with her fatigue.  PLAN   BPE Continue pantoprazole 40 mg  twice daily.  Encouraged protein intake - at least 1 daily. Keep follow up with PCP and nephrology for BP management.  Continue to follow with nephrology with recommendations for iron infusion. Follow up in 6 weeks.    Brooke Bonito, MSN, FNP-BC, AGACNP-BC [facility]

## 2023-03-22 ENCOUNTER — Encounter: Payer: Self-pay | Admitting: Gastroenterology

## 2023-03-22 ENCOUNTER — Ambulatory Visit (INDEPENDENT_AMBULATORY_CARE_PROVIDER_SITE_OTHER): Payer: Medicare HMO | Admitting: Gastroenterology

## 2023-03-22 ENCOUNTER — Encounter (INDEPENDENT_AMBULATORY_CARE_PROVIDER_SITE_OTHER): Payer: Self-pay

## 2023-03-22 VITALS — BP 197/79 | HR 68 | Temp 97.1°F | Ht 67.0 in | Wt 136.4 lb

## 2023-03-22 DIAGNOSIS — R634 Abnormal weight loss: Secondary | ICD-10-CM | POA: Diagnosis not present

## 2023-03-22 DIAGNOSIS — R1310 Dysphagia, unspecified: Secondary | ICD-10-CM

## 2023-03-22 DIAGNOSIS — K219 Gastro-esophageal reflux disease without esophagitis: Secondary | ICD-10-CM | POA: Diagnosis not present

## 2023-03-22 DIAGNOSIS — N184 Chronic kidney disease, stage 4 (severe): Secondary | ICD-10-CM

## 2023-03-22 DIAGNOSIS — D631 Anemia in chronic kidney disease: Secondary | ICD-10-CM | POA: Diagnosis not present

## 2023-03-22 NOTE — Patient Instructions (Addendum)
We will contact you to get you scheduled for your barium pill esophagram to further evaluate your swallowing.  Continue taking your pantoprazole 40 mg twice daily.  Continue your full liquid diet as well as eating any softer foods that you are able to tolerate. Eat soft foods as able.    I want you to focus on increased calories and protein intake to prevent further weight loss. Drink at least 1 protein shake per day or eat any meat that is tolerable for you and soft.   We will plan to follow-up in 6 weeks to reassess your weight and to see how you are doing with swallowing.  It was a pleasure to see you today. I want to create trusting relationships with patients. If you receive a survey regarding your visit,  I greatly appreciate you taking time to fill this out on paper or through your MyChart. I value your feedback.  Brooke Bonito, MSN, FNP-BC, AGACNP-BC [facility]

## 2023-03-26 ENCOUNTER — Ambulatory Visit (HOSPITAL_COMMUNITY)
Admission: RE | Admit: 2023-03-26 | Discharge: 2023-03-26 | Disposition: A | Discharge status: Home / Self Care | Payer: Medicare HMO | Source: Ambulatory Visit | Attending: Gastroenterology | Admitting: Gastroenterology

## 2023-03-26 DIAGNOSIS — R1310 Dysphagia, unspecified: Secondary | ICD-10-CM | POA: Insufficient documentation

## 2023-03-26 DIAGNOSIS — R638 Other symptoms and signs concerning food and fluid intake: Secondary | ICD-10-CM | POA: Diagnosis not present

## 2023-03-28 ENCOUNTER — Ambulatory Visit: Payer: Self-pay | Admitting: *Deleted

## 2023-03-28 NOTE — Patient Outreach (Signed)
  Care Coordination   03/28/2023 Name: Julia Santana MRN: [id number] DOB: 12-16-1940   Care Coordination Outreach Attempts:  An unsuccessful telephone outreach was attempted for a scheduled appointment today.  Follow Up Plan:  Additional outreach attempts will be made to offer the patient care coordination information and services.   Encounter Outcome:  No Answer   Care Coordination Interventions:  No, not indicated    Demetrios Loll, BSN, RN-BC RN Care Coordinator [facility]  [facility] Direct Dial: [phone] Main #: [phone]

## 2023-04-02 ENCOUNTER — Encounter: Payer: Self-pay | Admitting: *Deleted

## 2023-04-02 DIAGNOSIS — Z6821 Body mass index (BMI) 21.0-21.9, adult: Secondary | ICD-10-CM | POA: Diagnosis not present

## 2023-04-02 DIAGNOSIS — R059 Cough, unspecified: Secondary | ICD-10-CM | POA: Diagnosis not present

## 2023-04-02 DIAGNOSIS — Z20828 Contact with and (suspected) exposure to other viral communicable diseases: Secondary | ICD-10-CM | POA: Diagnosis not present

## 2023-04-02 DIAGNOSIS — U071 COVID-19: Secondary | ICD-10-CM

## 2023-04-02 DIAGNOSIS — R030 Elevated blood-pressure reading, without diagnosis of hypertension: Secondary | ICD-10-CM | POA: Diagnosis not present

## 2023-04-02 HISTORY — DX: COVID-19: U07.1

## 2023-04-05 ENCOUNTER — Other Ambulatory Visit: Payer: Self-pay | Admitting: Emergency Medicine

## 2023-04-13 DIAGNOSIS — U071 COVID-19: Secondary | ICD-10-CM | POA: Diagnosis not present

## 2023-04-13 DIAGNOSIS — R918 Other nonspecific abnormal finding of lung field: Secondary | ICD-10-CM | POA: Diagnosis not present

## 2023-04-13 DIAGNOSIS — R0602 Shortness of breath: Secondary | ICD-10-CM | POA: Diagnosis not present

## 2023-04-13 DIAGNOSIS — Z8616 Personal history of COVID-19: Secondary | ICD-10-CM | POA: Diagnosis not present

## 2023-04-13 DIAGNOSIS — R030 Elevated blood-pressure reading, without diagnosis of hypertension: Secondary | ICD-10-CM | POA: Diagnosis not present

## 2023-04-13 DIAGNOSIS — Z6820 Body mass index (BMI) 20.0-20.9, adult: Secondary | ICD-10-CM | POA: Diagnosis not present

## 2023-04-13 DIAGNOSIS — R059 Cough, unspecified: Secondary | ICD-10-CM | POA: Diagnosis not present

## 2023-04-16 ENCOUNTER — Other Ambulatory Visit (HOSPITAL_COMMUNITY): Payer: Self-pay | Admitting: Nephrology

## 2023-04-16 DIAGNOSIS — I129 Hypertensive chronic kidney disease with stage 1 through stage 4 chronic kidney disease, or unspecified chronic kidney disease: Secondary | ICD-10-CM | POA: Diagnosis not present

## 2023-04-16 DIAGNOSIS — N189 Chronic kidney disease, unspecified: Secondary | ICD-10-CM | POA: Diagnosis not present

## 2023-04-16 DIAGNOSIS — D631 Anemia in chronic kidney disease: Secondary | ICD-10-CM | POA: Diagnosis not present

## 2023-04-16 DIAGNOSIS — M109 Gout, unspecified: Secondary | ICD-10-CM | POA: Diagnosis not present

## 2023-04-16 DIAGNOSIS — R059 Cough, unspecified: Secondary | ICD-10-CM | POA: Diagnosis not present

## 2023-04-16 DIAGNOSIS — N184 Chronic kidney disease, stage 4 (severe): Secondary | ICD-10-CM | POA: Diagnosis not present

## 2023-04-16 NOTE — Progress Notes (Unsigned)
Patient name: Julia Santana MRN: [id number] DOB: [birth year] Sex: female  REASON FOR CONSULT: Dialysis access placement  HPI: Julia Santana is a 82 y.o. female, with history of CKD stage IV/V, hypertension, hyperlipidemia, coronary artery disease status post CABG in 2013 that presents for evaluation of permanent dialysis access.  She is right-handed.  Patient has previously been evaluates by Dr. Arbie Cookey and she did not have any surface vein and he recommended left arm AV graft.  She is right handed.  She is not on dialysis at this time.  She is followed by Dr. Lacy Duverney with nephrology.  No chest wall implants.    Past Medical History:  Diagnosis Date   Anemia of chronic disease    Asthma    CKD (chronic kidney disease) stage 4, GFR 15-29 ml/min (HCC)    Coronary atherosclerosis of native coronary artery    Multivessel status post CABG 8/13   Essential hypertension    GERD (gastroesophageal reflux disease)    Gout    Hyperparathyroidism (HCC)    Mixed hyperlipidemia    Seasonal allergies     Past Surgical History:  Procedure Laterality Date   CORONARY ARTERY BYPASS GRAFT  03/28/2012   Procedure: CORONARY ARTERY BYPASS GRAFTING (CABG);  Surgeon: Kerin Perna, MD;  Location: [facility] OR;  Service: Open Heart Surgery;  Laterality: N/A;  times 3, using Left Internal Mammary and Right Greater Saphenous  Vein Graft harvested endoscopically   PARATHYROIDECTOMY  12/15/2011   Procedure: PARATHYROIDECTOMY;  Surgeon: Dalia Heading, MD;  Location: [facility] ORS;  Service: General;  Laterality: Bilateral;    Family History  Problem Relation Age of Onset   Hypertension Mother    Diabetes type II Mother    Diabetes type II Sister    Diabetes type II Brother     SOCIAL HISTORY: Social History   Socioeconomic History   Marital status: Married    Spouse name: SHERMAN   Number of children: Not on file   Years of education: Not on file   Highest education level: Not on file  Occupational History    Occupation: RETIRED    Employer: RETIRED  Tobacco Use   Smoking status: Former    Current packs/day: 0.00    Average packs/day: 1 pack/day for 20.0 years (20.0 ttl pk-yrs)    Types: Cigarettes    Start date: 08/28/1960    Quit date: 08/28/1980    Years since quitting: 42.6   Smokeless tobacco: Never   Tobacco comments:    quit smoking about 25 years ago  Vaping Use   Vaping status: Never Used  Substance and Sexual Activity   Alcohol use: No   Drug use: No   Sexual activity: Not on file  Other Topics Concern   Not on file  Social History Narrative   Not on file   Social Determinants of Health   Financial Resource Strain: Low Risk  (03/15/2023)   Overall Financial Resource Strain (CARDIA)    Difficulty of Paying Living Expenses: Not very hard  Food Insecurity: No Food Insecurity (08/07/2022)   Hunger Vital Sign    Worried About Running Out of Food in the Last Year: Never true    Ran Out of Food in the Last Year: Never true  Transportation Needs: No Transportation Needs (03/15/2023)   PRAPARE - Administrator, Civil Service (Medical): No    Lack of Transportation (Non-Medical): No  Physical Activity: Not on file  Stress: Not on  file  Social Connections: Not on file  Intimate Partner Violence: Not on file    Allergies  Allergen Reactions   Cortisone     Knot that's lasted 1 year and leg pain   Furosemide Cough    Current Outpatient Medications  Medication Sig Dispense Refill   aspirin 81 MG tablet Take 81 mg by mouth daily.     atorvastatin (LIPITOR) 40 MG tablet Take 1 tablet (40 mg total) by mouth every evening. 30 tablet 11   cloNIDine (CATAPRES) 0.1 MG tablet Take 1 tablet (0.1 mg total) by mouth 2 (two) times daily. 60 tablet 6   hydrALAZINE (APRESOLINE) 50 MG tablet Take 50 mg by mouth 2 (two) times daily.     isosorbide mononitrate (IMDUR) 30 MG 24 hr tablet Take 1 tablet by mouth once daily 90 tablet 1   metoprolol succinate (TOPROL-XL) 100 MG 24 hr  tablet Take 100 mg by mouth daily.     pantoprazole (PROTONIX) 40 MG tablet Take 40 mg by mouth 2 (two) times daily.      torsemide (DEMADEX) 100 MG tablet Take 1/2 (one-half) tablet by mouth once daily 45 tablet 1   No current facility-administered medications for this visit.    REVIEW OF SYSTEMS:  [X]  denotes positive finding, [ ]  denotes negative finding Cardiac  Comments:  Chest pain or chest pressure:    Shortness of breath upon exertion:    Short of breath when lying flat:    Irregular heart rhythm:        Vascular    Pain in calf, thigh, or hip brought on by ambulation:    Pain in feet at night that wakes you up from your sleep:     Blood clot in your veins:    Leg swelling:         Pulmonary    Oxygen at home:    Productive cough:     Wheezing:         Neurologic    Sudden weakness in arms or legs:     Sudden numbness in arms or legs:     Sudden onset of difficulty speaking or slurred speech:    Temporary loss of vision in one eye:     Problems with dizziness:         Gastrointestinal    Blood in stool:     Vomited blood:         Genitourinary    Burning when urinating:     Blood in urine:        Psychiatric    Major depression:         Hematologic    Bleeding problems:    Problems with blood clotting too easily:        Skin    Rashes or ulcers:        Constitutional    Fever or chills:      PHYSICAL EXAM: There were no vitals filed for this visit.  GENERAL: The patient is a well-nourished female, in no acute distress. The vital signs are documented above. CARDIAC: There is a regular rate and rhythm.  VASCULAR:  Bilateral brachial pulses palpable Bilateral radial pulses weakly palpable PULMONARY: No respiratory distresss ABDOMEN: Soft and non-tender. MUSCULOSKELETAL: There are no major deformities or cyanosis. NEUROLOGIC: No focal weakness or paresthesias are detected. SKIN: There are no ulcers or rashes noted. PSYCHIATRIC: The patient has a  normal affect.  DATA:   No new vein mapping  Assessment/Plan:  82 y.o. female, with history of CKD stage IV/V, hypertension, hyperlipidemia, coronary artery disease status post CABG in 2013 that presents for evaluation of permanent dialysis access.  Discussed that Dr. Arbie Cookey previously evaluated her arms with [employer] and she had no good surface veins and he recommended AV graft.  Patient is right-handed and I recommended access in the nondominant arm which would be her left arm.    I discussed that I would reevaluate her left arm in the operating room to look for usable surface veins with ultrasound but again would anticipate AV graft placement.  I discussed with AVG higher risk of infection and has poorer durability.  I have asked that she confirm with her nephrologist Dr. Kathrene Bongo that it is appropriate to proceed given she is not on dialysis at this time although she has worsening GFR per her notes.  Our office will call and get her scheduled at her convenience.  Discussed risk of bleeding, infection, steal syndrome, failure to mature.  Discussed this being done as an outpatient at [facility].   Cephus Shelling, MD [facility] Office: [phone]

## 2023-04-17 ENCOUNTER — Encounter: Payer: Self-pay | Admitting: Vascular Surgery

## 2023-04-17 ENCOUNTER — Ambulatory Visit: Payer: Medicare HMO | Admitting: Vascular Surgery

## 2023-04-17 VITALS — BP 256/108 | HR 74 | Temp 97.2°F

## 2023-04-17 DIAGNOSIS — N185 Chronic kidney disease, stage 5: Secondary | ICD-10-CM | POA: Diagnosis not present

## 2023-04-18 ENCOUNTER — Other Ambulatory Visit: Payer: Self-pay

## 2023-04-18 DIAGNOSIS — N185 Chronic kidney disease, stage 5: Secondary | ICD-10-CM

## 2023-04-22 DIAGNOSIS — R109 Unspecified abdominal pain: Secondary | ICD-10-CM | POA: Diagnosis not present

## 2023-04-22 DIAGNOSIS — K5730 Diverticulosis of large intestine without perforation or abscess without bleeding: Secondary | ICD-10-CM | POA: Diagnosis not present

## 2023-04-22 DIAGNOSIS — R4182 Altered mental status, unspecified: Secondary | ICD-10-CM | POA: Diagnosis not present

## 2023-04-22 DIAGNOSIS — N2889 Other specified disorders of kidney and ureter: Secondary | ICD-10-CM | POA: Diagnosis not present

## 2023-04-22 DIAGNOSIS — Z888 Allergy status to other drugs, medicaments and biological substances status: Secondary | ICD-10-CM | POA: Diagnosis not present

## 2023-04-22 DIAGNOSIS — N281 Cyst of kidney, acquired: Secondary | ICD-10-CM | POA: Diagnosis not present

## 2023-04-22 DIAGNOSIS — Z7982 Long term (current) use of aspirin: Secondary | ICD-10-CM | POA: Diagnosis not present

## 2023-04-22 DIAGNOSIS — Z8616 Personal history of COVID-19: Secondary | ICD-10-CM | POA: Diagnosis not present

## 2023-04-22 DIAGNOSIS — I10 Essential (primary) hypertension: Secondary | ICD-10-CM | POA: Diagnosis not present

## 2023-04-22 DIAGNOSIS — R531 Weakness: Secondary | ICD-10-CM | POA: Diagnosis not present

## 2023-04-22 DIAGNOSIS — D649 Anemia, unspecified: Secondary | ICD-10-CM | POA: Diagnosis not present

## 2023-04-22 DIAGNOSIS — D259 Leiomyoma of uterus, unspecified: Secondary | ICD-10-CM | POA: Diagnosis not present

## 2023-04-22 DIAGNOSIS — Z79899 Other long term (current) drug therapy: Secondary | ICD-10-CM | POA: Diagnosis not present

## 2023-04-23 ENCOUNTER — Encounter (HOSPITAL_COMMUNITY): Payer: Self-pay | Admitting: Emergency Medicine

## 2023-04-23 ENCOUNTER — Emergency Department (HOSPITAL_COMMUNITY)
Admission: EM | Admit: 2023-04-23 | Discharge: 2023-04-24 | Disposition: A | Discharge status: Home / Self Care | Payer: Medicare HMO | Attending: Emergency Medicine | Admitting: Emergency Medicine

## 2023-04-23 ENCOUNTER — Emergency Department (HOSPITAL_COMMUNITY): Payer: Medicare HMO

## 2023-04-23 ENCOUNTER — Other Ambulatory Visit: Payer: Self-pay

## 2023-04-23 DIAGNOSIS — Z79899 Other long term (current) drug therapy: Secondary | ICD-10-CM | POA: Diagnosis not present

## 2023-04-23 DIAGNOSIS — Z7982 Long term (current) use of aspirin: Secondary | ICD-10-CM | POA: Diagnosis not present

## 2023-04-23 DIAGNOSIS — D649 Anemia, unspecified: Secondary | ICD-10-CM | POA: Diagnosis not present

## 2023-04-23 DIAGNOSIS — R109 Unspecified abdominal pain: Secondary | ICD-10-CM

## 2023-04-23 DIAGNOSIS — R1011 Right upper quadrant pain: Secondary | ICD-10-CM | POA: Insufficient documentation

## 2023-04-23 DIAGNOSIS — R079 Chest pain, unspecified: Secondary | ICD-10-CM | POA: Diagnosis not present

## 2023-04-23 DIAGNOSIS — Z8616 Personal history of COVID-19: Secondary | ICD-10-CM | POA: Insufficient documentation

## 2023-04-23 DIAGNOSIS — E11649 Type 2 diabetes mellitus with hypoglycemia without coma: Secondary | ICD-10-CM | POA: Diagnosis not present

## 2023-04-23 DIAGNOSIS — E162 Hypoglycemia, unspecified: Secondary | ICD-10-CM | POA: Diagnosis not present

## 2023-04-23 DIAGNOSIS — I10 Essential (primary) hypertension: Secondary | ICD-10-CM | POA: Diagnosis not present

## 2023-04-23 DIAGNOSIS — I517 Cardiomegaly: Secondary | ICD-10-CM | POA: Diagnosis not present

## 2023-04-23 LAB — CBC WITH DIFFERENTIAL/PLATELET
Abs Immature Granulocytes: 0.02 10*3/uL (ref 0.00–0.07)
Basophils Absolute: 0.1 10*3/uL (ref 0.0–0.1)
Basophils Relative: 1 %
Eosinophils Absolute: 0.3 10*3/uL (ref 0.0–0.5)
Eosinophils Relative: 4 %
HCT: 31.3 % — ABNORMAL LOW (ref 36.0–46.0)
Hemoglobin: 9.8 g/dL — ABNORMAL LOW (ref 12.0–15.0)
Immature Granulocytes: 0 %
Lymphocytes Relative: 39 %
Lymphs Abs: 2.3 10*3/uL (ref 0.7–4.0)
MCH: 27.8 pg (ref 26.0–34.0)
MCHC: 31.3 g/dL (ref 30.0–36.0)
MCV: 88.7 fL (ref 80.0–100.0)
Monocytes Absolute: 0.5 10*3/uL (ref 0.1–1.0)
Monocytes Relative: 8 %
Neutro Abs: 2.9 10*3/uL (ref 1.7–7.7)
Neutrophils Relative %: 48 %
Platelets: 243 10*3/uL (ref 150–400)
RBC: 3.53 MIL/uL — ABNORMAL LOW (ref 3.87–5.11)
RDW: 15.5 % (ref 11.5–15.5)
WBC: 6 10*3/uL (ref 4.0–10.5)
nRBC: 0 % (ref 0.0–0.2)

## 2023-04-23 LAB — COMPREHENSIVE METABOLIC PANEL
ALT: 16 U/L (ref 0–44)
AST: 25 U/L (ref 15–41)
Albumin: 3.8 g/dL (ref 3.5–5.0)
Alkaline Phosphatase: 66 U/L (ref 38–126)
Anion gap: 12 (ref 5–15)
BUN: 23 mg/dL (ref 8–23)
CO2: 21 mmol/L — ABNORMAL LOW (ref 22–32)
Calcium: 11.3 mg/dL — ABNORMAL HIGH (ref 8.9–10.3)
Chloride: 101 mmol/L (ref 98–111)
Creatinine, Ser: 2.26 mg/dL — ABNORMAL HIGH (ref 0.44–1.00)
GFR, Estimated: 21 mL/min — ABNORMAL LOW (ref >60)
Glucose, Bld: 58 mg/dL — ABNORMAL LOW (ref 70–99)
Potassium: 3.6 mmol/L (ref 3.5–5.1)
Sodium: 134 mmol/L — ABNORMAL LOW (ref 135–145)
Total Bilirubin: 1 mg/dL (ref 0.3–1.2)
Total Protein: 6.7 g/dL (ref 6.5–8.1)

## 2023-04-23 LAB — LIPASE, BLOOD: Lipase: 95 U/L — ABNORMAL HIGH (ref 11–51)

## 2023-04-23 MED ORDER — SODIUM CHLORIDE 0.9 % IV BOLUS
1000.0000 mL | Freq: Once | INTRAVENOUS | Status: AC
  Administered 2023-04-23: 1000 mL via INTRAVENOUS

## 2023-04-23 MED ORDER — CLONIDINE HCL 0.1 MG PO TABS
0.1000 mg | ORAL_TABLET | Freq: Once | ORAL | Status: AC
  Administered 2023-04-23: 0.1 mg via ORAL
  Filled 2023-04-23: qty 1

## 2023-04-23 MED ORDER — FAMOTIDINE IN NACL 20-0.9 MG/50ML-% IV SOLN
20.0000 mg | Freq: Once | INTRAVENOUS | Status: AC
  Administered 2023-04-23: 20 mg via INTRAVENOUS
  Filled 2023-04-23: qty 50

## 2023-04-23 MED ORDER — TRAMADOL HCL 50 MG PO TABS: 50.0000 mg | ORAL_TABLET | Freq: Three times a day (TID) | ORAL | 0 refills | Status: DC | PRN

## 2023-04-23 MED ORDER — FAMOTIDINE 20 MG PO TABS: 20.0000 mg | ORAL_TABLET | Freq: Two times a day (BID) | ORAL | 0 refills | Status: AC

## 2023-04-23 MED ORDER — HYDRALAZINE HCL 25 MG PO TABS
50.0000 mg | ORAL_TABLET | Freq: Once | ORAL | Status: AC
  Administered 2023-04-23: 50 mg via ORAL
  Filled 2023-04-23: qty 2

## 2023-04-23 NOTE — ED Provider Notes (Signed)
[place] EMERGENCY DEPARTMENT AT [facility] Provider Note   CSN: [id number] Arrival date & time: 04/23/23  1919     History {Add pertinent medical, surgical, social history, OB history to HPI:1} Chief Complaint  Patient presents with   Flank Pain    Julia Santana is a 82 y.o. female presenting to ED with chronic left flank pain.  Supplement history is provided by the patient's son, reports that the patient's memory has been "off" ever since she had COVID about a month ago.  The patient reports she has been complaining of left-sided flank pain and left abdominal pain ongoing for a long time, but acutely worsening in the past several days.  This prompted their visit to an outside hospital ED yesterday.  When they return to home today, he says he found the patient doubled over in her room clutching her left side and complaining of severe pain.  There was no reported nausea or vomiting.  Currently the patient reports that her pain is extremely minimal.  Her son also notes the patient complained of significant pain after drinking orange juice, however she did not have any pain after drinking water.  She went to an outside hospital records are visible on Care Everywhere, where she had a workup done for abdominal pain which included a right upper quadrant ultrasound as well as a CT abdomen pelvis without contrast.  On the CT scan she was noted to have a 3 cm subcapsular lesion in the lateral midpole of the left kidney which could be consistent with a solid renal mass or hemorrhagic cyst, and had been recommended for outpatient MRI to better characterize this.  The patient reports that she produces only a small amount of urine daily, but she has not yet on dialysis, although she has an process in getting evaluated for that.  She had an xray of the chest yesterday as well showing no active disease  Records show UA performed yesterday with high specific gravity, negative nitrite and  leukocyte. Cr 2.30, BUN 21, K 3.7. Trop normal.  Hgb 8.6. WBC 4.9.  HPI     Home Medications Prior to Admission medications   Medication Sig Start Date End Date Taking? Authorizing Provider  aspirin 81 MG tablet Take 81 mg by mouth daily.    [provider]  atorvastatin (LIPITOR) 40 MG tablet Take 1 tablet (40 mg total) by mouth every evening.    Jonelle Sidle, MD  cloNIDine (CATAPRES) 0.1 MG tablet Take 1 tablet (0.1 mg total) by mouth 2 (two) times daily. 12/07/22   Jonelle Sidle, MD  hydrALAZINE (APRESOLINE) 50 MG tablet Take 50 mg by mouth 2 (two) times daily.    [provider]  isosorbide mononitrate (IMDUR) 30 MG 24 hr tablet Take 1 tablet by mouth once daily 11/30/22   Jonelle Sidle, MD  metoprolol succinate (TOPROL-XL) 100 MG 24 hr tablet Take 100 mg by mouth daily. 02/02/22   [provider]  pantoprazole (PROTONIX) 40 MG tablet Take 40 mg by mouth 2 (two) times daily.     [provider]  torsemide (DEMADEX) 100 MG tablet Take 1/2 (one-half) tablet by mouth once daily 12/19/22   Jonelle Sidle, MD      Allergies    Cortisone and Furosemide    Review of Systems   Review of Systems  Physical Exam Updated Vital Signs BP (!) 220/70   Pulse 72   Temp 99.7 F (37.6 C) (Oral)  Resp 15   Ht 5\' 7"  (1.702 m)   Wt 70 kg   SpO2 99%   BMI 24.17 kg/m  Physical Exam Constitutional:      General: She is not in acute distress. HENT:     Head: Normocephalic and atraumatic.  Eyes:     Conjunctiva/sclera: Conjunctivae normal.     Pupils: Pupils are equal, round, and reactive to light.  Cardiovascular:     Rate and Rhythm: Normal rate and regular rhythm.  Pulmonary:     Effort: Pulmonary effort is normal. No respiratory distress.  Abdominal:     General: There is no distension.     Tenderness: There is no abdominal tenderness.  Skin:    General: Skin is warm and dry.  Neurological:     General: No focal deficit present.      Mental Status: She is alert. Mental status is at baseline.  Psychiatric:        Mood and Affect: Mood normal.        Behavior: Behavior normal.     ED Results / Procedures / Treatments   Labs (all labs ordered are listed, but only abnormal results are displayed) Labs Reviewed  CBC WITH DIFFERENTIAL/PLATELET - Abnormal; Notable for the following components:      Result Value   RBC 3.53 (*)    Hemoglobin 9.8 (*)    HCT 31.3 (*)    All other components within normal limits  LIPASE, BLOOD - Abnormal; Notable for the following components:   Lipase 95 (*)    All other components within normal limits  COMPREHENSIVE METABOLIC PANEL - Abnormal; Notable for the following components:   Sodium 134 (*)    CO2 21 (*)    Glucose, Bld 58 (*)    Creatinine, Ser 2.26 (*)    Calcium 11.3 (*)    GFR, Estimated 21 (*)    All other components within normal limits    EKG None  Radiology DG Chest 2 View  Result Date: 04/23/2023 CLINICAL DATA:  Left-sided chest pain.  Evaluate for pneumonia. EXAM: CHEST - 2 VIEW COMPARISON:  Chest radiograph dated 04/22/2023. FINDINGS: No focal consolidation, pleural effusion, pneumothorax. Stable cardiomegaly. Median sternotomy wires and CABG vascular clips. No acute osseous pathology. Thyroidectomy clips. IMPRESSION: 1. No active cardiopulmonary disease. 2. Cardiomegaly. Electronically Signed   By: Elgie Collard M.D.   On: 04/23/2023 22:00    Procedures Procedures  {Document cardiac monitor, telemetry assessment procedure when appropriate:1}  Medications Ordered in ED Medications  cloNIDine (CATAPRES) tablet 0.1 mg (has no administration in time range)  hydrALAZINE (APRESOLINE) tablet 50 mg (has no administration in time range)  sodium chloride 0.9 % bolus 1,000 mL (1,000 mLs Intravenous New Bag/Given 04/23/23 2238)  famotidine (PEPCID) IVPB 20 mg premix (20 mg Intravenous New Bag/Given 04/23/23 2239)    ED Course/ Medical Decision Making/  A&P Clinical Course as of 04/23/23 2321  Mon Apr 23, 2023  2321 PO challenge [MT]    Clinical Course User Index [MT] Terald Sleeper, MD   {   Click here for ABCD2, HEART and other calculatorsREFRESH Note before signing :1}                              Medical Decision Making Amount and/or Complexity of Data Reviewed Labs: ordered. Radiology: ordered.  Risk Prescription drug management.   This patient presents to the ED with concern for left flank  and abdominal pain. This involves an extensive number of treatment options, and is a complaint that carries with it a high risk of complications and morbidity.  The differential diagnosis includes gastritis versus peptic ulcer versus functional bowel disease or abdominal pain versus gas pain versus referred musculoskeletal pain versus other  The patient appears quite comfortable in the room does not have acute pain out of proportion on exam to suggest that this is acute mesenteric ischemia, and likewise no evidence of AAA on her prior imaging or persistent pain to suggest AAA.  Additional history obtained from patient's son at bedside  External records from outside source obtained and reviewed including OSH ED records from [facility] yesterday, RUQ ultrasound and CT abdomen pelvis without contrast, noting an indeterminate 3 cm lesion in the lateral midpole of the left kidney, potential hemorrhagic cyst versus solid renal mass, recommending MRI   I ordered and personally interpreted labs.  The pertinent results include: Creatinine level stable near baseline, 2.26.  Glucose is low at 58.  Hemoglobin is stable to chronic anemic levels, 9.8.  Lipase very mildly elevated.  The patient was maintained on a cardiac monitor.  I personally viewed and interpreted the cardiac monitored which showed an underlying rhythm of: Sinus rhythm  I ordered medication including clonidine and hydralazine for hypertension, IV fluid as well as IV Pepcid for  hydration and suspected gastritis  I have reviewed the patients home medicines and have made adjustments as needed  Given that the patient has very minimal abdominal pain or discomfort I do not see an indication for repeat CT imaging at this time.  I was able to review her imaging and ultrasound from yesterday, and there were no emergent findings.  Likewise I do not see evidence of infection from her UA obtained yesterday  After the interventions noted above, I reevaluated the patient and found that they have: stayed the same  At the time of signout to the evening ED team, Dr Bebe Shaggy, the patient was pending p.o. challenge and reassessment of her glucose, if she is able to keep down food and fluids without significant abdominal tenderness anticipate she could be discharged home, potentially with an as needed pain medicine prescription which was provided.  I did explain to the patient's son is present at the bedside that she does need follow-up as an outpatient with her nephrologist regarding this renal cyst versus mass, and may need an MRI, but this is not emergently indicated in the emergency room today.  {Document critical care time when appropriate:1} {Document review of labs and clinical decision tools ie heart score, Chads2Vasc2 etc:1}  {Document your independent review of radiology images, and any outside records:1} {Document your discussion with family members, caretakers, and with consultants:1} {Document social determinants of health affecting pt's care:1} {Document your decision making why or why not admission, treatments were needed:1} Final Clinical Impression(s) / ED Diagnoses Final diagnoses:  None    Rx / DC Orders ED Discharge Orders     None

## 2023-04-23 NOTE — ED Provider Notes (Signed)
Plan at signout - PO Challenge, recheck CBG and likely d/c home Will call sign prior to D/C   Zadie Rhine, MD 04/23/23 2339

## 2023-04-23 NOTE — ED Triage Notes (Addendum)
Patient reports left flank pain that started on Tuesday.  Patient recently had covid pna and the pain started after that.  Patient reports "kidney problems." Patient seen at [facility] yesterday for same and had labs/chest xray and was told she needed an MRI.

## 2023-04-24 LAB — CBG MONITORING, ED
Glucose-Capillary: 52 mg/dL — ABNORMAL LOW (ref 70–99)
Glucose-Capillary: 56 mg/dL — ABNORMAL LOW (ref 70–99)
Glucose-Capillary: 81 mg/dL (ref 70–99)

## 2023-04-24 NOTE — ED Provider Notes (Signed)
Overall patient is improved.  She was finally able to take ginger ale and crackers and a sandwich.  Glucose is now trending in the right direction.  Patient is not a known diabetic and is not on any oral hypoglycemics  Patient is in no acute distress, no new pain.  Patient and family feel comfortable discharge home.  Prescriptions have already been sent to pharmacy from previous EDP   Zadie Rhine, MD 04/24/23 0201

## 2023-05-01 ENCOUNTER — Encounter (HOSPITAL_COMMUNITY): Payer: Self-pay | Admitting: Physician Assistant

## 2023-05-01 ENCOUNTER — Telehealth: Payer: Self-pay

## 2023-05-01 NOTE — Progress Notes (Signed)
Spoke to patients son to review information for SDW.  States he is waiting a call back from her PCP as sometime she takes her medications and other times she does not.  He is not entirely sure what medication she is suppose to be on.  He is concerned about whether she should have the surgery on 05/02/2023.  Her PCP recommended he speak to the surgeon, Dr. Chestine Spore.  I gave him the number to Dr. Ophelia Charter office and sent a message to [facility].  I will call him later and review SDW information if they are going to proceed with the surgery.

## 2023-05-01 NOTE — Progress Notes (Signed)
Anesthesia Chart Review:  Follows with cardiology for history of CAD s/p CABG 2013, HTN, HLD, carotid stenosis (1 to 39% right ICA, 40 to 59% left ICA).  Echo in 2021 showed LVEF 65 to 70%.  Lexiscan Myoview at that time revealed variable soft tissue attenuation at the apex versus minor ischemic territory.  Last seen by Dr. Diona Browner 12/07/2022, stable at that time, recommended continue current medical therapy for CAD.  Blood pressure noted to be elevated.  Recommended continue hydralazine Toprol succinate.  Clonidine 0.1 mg was added twice daily.  51-month follow-up recommended.  Blood pressure continues to be uncontrolled.  BP recorded was 197/79 when seen by gastroenterology on 03/22/2023,  256/108 when seen by Dr. Chestine Spore on 04/17/2023, 190/62 when seen in the ED for gastritis on 04/24/2023. I reached out to Dr. Ophelia Charter office to advise pt at high risk of DOS cancellation given uncontrolled HTN.   History of CKD 5, not yet on dialysis.  CMP and CBC from 04/23/2023 reviewed, mild hyponatremia sodium 134, creatinine 2.26 consistent with history of CKD, chronic anemia with hemoglobin 9.8.  EKG 12/07/2022: Sinus Bradycardia -First degree A-V block.  Rate 59. Old anterior infarct. Nonspecific T-abnormality.  Nuclear stress 09/22/2019: No diagnostic ST segment changes to indicate ischemia. Small, mild intensity, apical anteroseptal defect that is partially reversible. Suggestive of variable soft tissue attenuation versus minor ischemic territory. This is a low risk study. Nuclear stress EF: 65%.  TTE 09/17/2019:  1. Left ventricular ejection fraction, by visual estimation, is 65 to  70%. The left ventricle has normal function. There is borderline left  ventricular hypertrophy.   2. Left ventricular diastolic parameters are consistent with Grade I  diastolic dysfunction (impaired relaxation).   3. The left ventricle has no regional wall motion abnormalities.   4. Global right ventricle has normal systolic  function.The right  ventricular size is normal. No increase in right ventricular wall  thickness.   5. Left atrial size was upper normal.   6. Right atrial size was normal.   7. The mitral valve is grossly normal. Mild mitral valve regurgitation.   8. The tricuspid valve is grossly normal.   9. The tricuspid valve is grossly normal. Tricuspid valve regurgitation  is trivial.  10. The aortic valve is tricuspid. Aortic valve regurgitation is not  visualized.  11. The pulmonic valve was grossly normal. Pulmonic valve regurgitation is  not visualized.  12. Normal pulmonary artery systolic pressure.  13. The tricuspid regurgitant velocity is 1.93 m/s, and with an assumed  right atrial pressure of 3 mmHg, the estimated right ventricular systolic  pressure is normal at 17.9 mmHg.  14. The inferior vena cava is normal in size with greater than 50%  respiratory variability, suggesting right atrial pressure of 3 mmHg.     Zannie Cove [facility] Short Stay Center/Anesthesiology Phone [phone] 05/01/2023 10:09 AM

## 2023-05-01 NOTE — Telephone Encounter (Signed)
Received a call from patient's son Thereasa Distance Wentling. He reports patient is weak, has not been eating, losing weight and not taking medications as prescribed. He does not feel that she is capable of having surgery on tomorrow and would like to postpone for now and will call back when ready. He has already contacted PCP office to address concerns. Will update Dr. Chestine Spore.

## 2023-05-02 ENCOUNTER — Encounter (HOSPITAL_COMMUNITY): Admission: RE | Payer: Self-pay | Source: Ambulatory Visit

## 2023-05-02 ENCOUNTER — Ambulatory Visit (HOSPITAL_COMMUNITY): Admission: RE | Admit: 2023-05-02 | Payer: Medicare HMO | Source: Ambulatory Visit | Admitting: Vascular Surgery

## 2023-05-02 SURGERY — ARTERIOVENOUS (AV) FISTULA CREATION
Anesthesia: Choice | Laterality: Left

## 2023-05-07 NOTE — Progress Notes (Deleted)
GI Office Note    Referring Provider: Royann Santana, * Primary Care Physician:  Julia Santana Primary Gastroenterologist: ***  Date:  05/07/2023  ID:  Julia Santana, DOB 10-02-1940, MRN [id number]   Chief Complaint   No chief complaint on file.    History of Present Illness  Julia Santana is a 82 y.o. female with a history of *** presenting today with complaint of     Current Outpatient Medications  Medication Sig Dispense Refill   aspirin 81 MG tablet Take 81 mg by mouth daily.     atorvastatin (LIPITOR) 40 MG tablet Take 1 tablet (40 mg total) by mouth every evening. 30 tablet 11   cloNIDine (CATAPRES) 0.1 MG tablet Take 1 tablet (0.1 mg total) by mouth 2 (two) times daily. 60 tablet 6   famotidine (PEPCID) 20 MG tablet Take 1 tablet (20 mg total) by mouth 2 (two) times daily. 60 tablet 0   hydrALAZINE (APRESOLINE) 50 MG tablet Take 50 mg by mouth 2 (two) times daily.     isosorbide mononitrate (IMDUR) 30 MG 24 hr tablet Take 1 tablet by mouth once daily 90 tablet 1   metoprolol succinate (TOPROL-XL) 100 MG 24 hr tablet Take 100 mg by mouth daily.     pantoprazole (PROTONIX) 40 MG tablet Take 40 mg by mouth 2 (two) times daily.      torsemide (DEMADEX) 100 MG tablet Take 1/2 (one-half) tablet by mouth once daily 45 tablet 1   traMADol (ULTRAM) 50 MG tablet Take 1 tablet (50 mg total) by mouth every 8 (eight) hours as needed for up to 15 doses for severe pain. 15 tablet 0   No current facility-administered medications for this visit.    Past Medical History:  Diagnosis Date   Anemia of chronic disease    Asthma    CKD (chronic kidney disease) stage 4, GFR 15-29 ml/min (HCC)    Coronary atherosclerosis of native coronary artery    Multivessel status post CABG 8/13   Essential hypertension    GERD (gastroesophageal reflux disease)    Gout    Hyperparathyroidism (HCC)    Mixed hyperlipidemia    Seasonal allergies     Past Surgical History:   Procedure Laterality Date   CORONARY ARTERY BYPASS GRAFT  03/28/2012   Procedure: CORONARY ARTERY BYPASS GRAFTING (CABG);  Surgeon: Julia Perna, MD;  Location: [facility] OR;  Service: Open Heart Surgery;  Laterality: N/A;  times 3, using Left Internal Mammary and Right Greater Saphenous  Vein Graft harvested endoscopically   PARATHYROIDECTOMY  12/15/2011   Procedure: PARATHYROIDECTOMY;  Surgeon: Julia Heading, MD;  Location: [facility] ORS;  Service: General;  Laterality: Bilateral;    Family History  Problem Relation Age of Onset   Hypertension Mother    Diabetes type II Mother    Diabetes type II Sister    Diabetes type II Brother     Allergies as of 05/08/2023 - Review Complete 04/23/2023  Allergen Reaction Noted   Cortisone  09/11/2019   Furosemide Cough 02/25/2009    Social History   Socioeconomic History   Marital status: Married    Spouse name: Julia Santana   Number of children: Not on file   Years of education: Not on file   Highest education level: Not on file  Occupational History   Occupation: RETIRED    Employer: RETIRED  Tobacco Use   Smoking status: Former    Current packs/day: 0.00  Average packs/day: 1 pack/day for 20.0 years (20.0 ttl pk-yrs)    Types: Cigarettes    Start date: 08/28/1960    Quit date: 08/28/1980    Years since quitting: 42.7   Smokeless tobacco: Never   Tobacco comments:    quit smoking about 25 years ago  Vaping Use   Vaping status: Never Used  Substance and Sexual Activity   Alcohol use: No   Drug use: No   Sexual activity: Not on file  Other Topics Concern   Not on file  Social History Narrative   Not on file   Social Determinants of Health   Financial Resource Strain: Low Risk  (03/15/2023)   Overall Financial Resource Strain (CARDIA)    Difficulty of Paying Living Expenses: Not very hard  Food Insecurity: No Food Insecurity (08/07/2022)   Hunger Vital Sign    Worried About Running Out of Food in the Last Year: Never true    Ran Out of  Food in the Last Year: Never true  Transportation Needs: No Transportation Needs (03/15/2023)   PRAPARE - Administrator, Civil Service (Medical): No    Lack of Transportation (Non-Medical): No  Physical Activity: Not on file  Stress: Not on file  Social Connections: Not on file     Review of Systems   Gen: Denies fever, chills, anorexia. Denies fatigue, weakness, weight loss.  CV: Denies chest pain, palpitations, syncope, peripheral edema, and claudication. Resp: Denies dyspnea at rest, cough, wheezing, coughing up blood, and pleurisy. GI: See HPI Derm: Denies rash, itching, dry skin Psych: Denies depression, anxiety, memory loss, confusion. No homicidal or suicidal ideation.  Heme: Denies bruising, bleeding, and enlarged lymph nodes.   Physical Exam   There were no vitals taken for this visit.  General:   Alert and oriented. No distress noted. Pleasant and cooperative.  Head:  Normocephalic and atraumatic. Eyes:  Conjuctiva clear without scleral icterus. Mouth:  Oral mucosa pink and moist. Good dentition. No lesions. Lungs:  Clear to auscultation bilaterally. No wheezes, rales, or rhonchi. No distress.  Heart:  S1, S2 present without murmurs appreciated.  Abdomen:  +BS, soft, non-tender and non-distended. No rebound or guarding. No HSM or masses noted. Rectal: *** Msk:  Symmetrical without gross deformities. Normal posture. Extremities:  Without edema. Neurologic:  Alert and  oriented x4 Psych:  Alert and cooperative. Normal mood and affect.   Assessment  Julia Santana is a 82 y.o. female with a history of *** presenting today with   Dysphagia:   PLAN   *** Mesenteric dopplers?     Brooke Bonito, MSN, FNP-BC, AGACNP-BC [facility]

## 2023-05-08 ENCOUNTER — Ambulatory Visit: Payer: Medicare HMO | Admitting: Gastroenterology

## 2023-05-08 ENCOUNTER — Encounter: Payer: Self-pay | Admitting: Gastroenterology

## 2023-05-08 DIAGNOSIS — R609 Edema, unspecified: Secondary | ICD-10-CM | POA: Diagnosis not present

## 2023-05-08 DIAGNOSIS — K219 Gastro-esophageal reflux disease without esophagitis: Secondary | ICD-10-CM | POA: Diagnosis not present

## 2023-05-08 DIAGNOSIS — M1000 Idiopathic gout, unspecified site: Secondary | ICD-10-CM | POA: Diagnosis not present

## 2023-05-08 DIAGNOSIS — E049 Nontoxic goiter, unspecified: Secondary | ICD-10-CM | POA: Diagnosis not present

## 2023-05-08 DIAGNOSIS — E213 Hyperparathyroidism, unspecified: Secondary | ICD-10-CM | POA: Diagnosis not present

## 2023-05-08 DIAGNOSIS — I10 Essential (primary) hypertension: Secondary | ICD-10-CM | POA: Diagnosis not present

## 2023-05-08 DIAGNOSIS — E7849 Other hyperlipidemia: Secondary | ICD-10-CM | POA: Diagnosis not present

## 2023-05-08 DIAGNOSIS — I5031 Acute diastolic (congestive) heart failure: Secondary | ICD-10-CM | POA: Diagnosis not present

## 2023-05-08 DIAGNOSIS — N184 Chronic kidney disease, stage 4 (severe): Secondary | ICD-10-CM | POA: Diagnosis not present

## 2023-05-08 NOTE — Telephone Encounter (Signed)
Received a call from patient's son, Julia Santana. Reports patient was seen by her PCP on today and was advised for her to proceed with scheduling surgery. Rescheduled left arm AVF/AVG for 9/18. Instructions provided and will be sent to email provided. Julia Santana voiced understanding.

## 2023-05-15 ENCOUNTER — Encounter (HOSPITAL_COMMUNITY): Payer: Self-pay | Admitting: Vascular Surgery

## 2023-05-15 ENCOUNTER — Other Ambulatory Visit: Payer: Self-pay

## 2023-05-15 NOTE — Progress Notes (Signed)
PCP - [facility] Cardiologist - Dr Nona Dell Nephrology - Dr Annie Sable  Chest x-ray - 04/23/23 (2V) EKG - 12/07/22 Stress Test - 09/22/19 ECHO - 09/17/19 Cardiac Cath - 03/22/12  ICD Pacemaker/Loop - n/a  Sleep Study -  n/a CPAP - none  Diabetes - n/a  NPO  Anesthesia review: Yes, note written on 05/01/23  STOP now taking any Aspirin (unless otherwise instructed by your surgeon), Aleve, Naproxen, Ibuprofen, Motrin, Advil, Goody's, BC's, all herbal medications, fish oil, and all vitamins.   Coronavirus Screening Does the patient have any of the following symptoms:  Cough yes/no: No Fever (>100.75F)  yes/no: No Runny nose yes/no: No Sore throat yes/no: No Difficulty breathing/shortness of breath  Yes  Has the patient traveled in the last 14 days and where? yes/no: No  Patient's Son Julia Santana verbalized understanding of instructions that were given via phone.

## 2023-05-16 ENCOUNTER — Ambulatory Visit (HOSPITAL_COMMUNITY)
Admission: RE | Admit: 2023-05-16 | Discharge: 2023-05-16 | Disposition: A | Discharge status: Home / Self Care | Payer: Medicare HMO | Source: Ambulatory Visit | Attending: Vascular Surgery | Admitting: Vascular Surgery

## 2023-05-16 ENCOUNTER — Encounter (HOSPITAL_COMMUNITY)
Admission: RE | Disposition: A | Discharge status: Home / Self Care | Payer: Self-pay | Source: Ambulatory Visit | Attending: Vascular Surgery

## 2023-05-16 ENCOUNTER — Other Ambulatory Visit: Payer: Self-pay

## 2023-05-16 ENCOUNTER — Ambulatory Visit (HOSPITAL_COMMUNITY): Payer: Self-pay | Admitting: Vascular Surgery

## 2023-05-16 DIAGNOSIS — Z87891 Personal history of nicotine dependence: Secondary | ICD-10-CM | POA: Diagnosis not present

## 2023-05-16 DIAGNOSIS — E782 Mixed hyperlipidemia: Secondary | ICD-10-CM | POA: Insufficient documentation

## 2023-05-16 DIAGNOSIS — I120 Hypertensive chronic kidney disease with stage 5 chronic kidney disease or end stage renal disease: Secondary | ICD-10-CM

## 2023-05-16 DIAGNOSIS — I340 Nonrheumatic mitral (valve) insufficiency: Secondary | ICD-10-CM | POA: Diagnosis not present

## 2023-05-16 DIAGNOSIS — N185 Chronic kidney disease, stage 5: Secondary | ICD-10-CM | POA: Insufficient documentation

## 2023-05-16 DIAGNOSIS — Z951 Presence of aortocoronary bypass graft: Secondary | ICD-10-CM | POA: Diagnosis not present

## 2023-05-16 DIAGNOSIS — I2510 Atherosclerotic heart disease of native coronary artery without angina pectoris: Secondary | ICD-10-CM

## 2023-05-16 DIAGNOSIS — J45909 Unspecified asthma, uncomplicated: Secondary | ICD-10-CM | POA: Diagnosis not present

## 2023-05-16 DIAGNOSIS — G8918 Other acute postprocedural pain: Secondary | ICD-10-CM | POA: Diagnosis not present

## 2023-05-16 DIAGNOSIS — K219 Gastro-esophageal reflux disease without esophagitis: Secondary | ICD-10-CM | POA: Insufficient documentation

## 2023-05-16 HISTORY — PX: AV FISTULA PLACEMENT: SHX1204

## 2023-05-16 LAB — POCT I-STAT, CHEM 8
BUN: 43 mg/dL — ABNORMAL HIGH (ref 8–23)
Calcium, Ion: 1.36 mmol/L (ref 1.15–1.40)
Chloride: 98 mmol/L (ref 98–111)
Creatinine, Ser: 4.7 mg/dL — ABNORMAL HIGH (ref 0.44–1.00)
Glucose, Bld: 83 mg/dL (ref 70–99)
HCT: 29 % — ABNORMAL LOW (ref 36.0–46.0)
Hemoglobin: 9.9 g/dL — ABNORMAL LOW (ref 12.0–15.0)
Potassium: 3.7 mmol/L (ref 3.5–5.1)
Sodium: 134 mmol/L — ABNORMAL LOW (ref 135–145)
TCO2: 26 mmol/L (ref 22–32)

## 2023-05-16 SURGERY — ARTERIOVENOUS (AV) FISTULA CREATION
Anesthesia: Regional | Site: Arm Upper | Laterality: Left

## 2023-05-16 MED ORDER — ORAL CARE MOUTH RINSE: 15.0000 mL | Freq: Once | OROMUCOSAL | Status: AC

## 2023-05-16 MED ORDER — ONDANSETRON HCL 4 MG/2ML IJ SOLN: 4.0000 mg | Freq: Once | INTRAMUSCULAR | Status: DC | PRN

## 2023-05-16 MED ORDER — MIDAZOLAM HCL 2 MG/2ML IJ SOLN
INTRAMUSCULAR | Status: AC
  Filled 2023-05-16: qty 2

## 2023-05-16 MED ORDER — FENTANYL CITRATE PF 50 MCG/ML IJ SOSY
25.0000 ug | PREFILLED_SYRINGE | Freq: Once | INTRAMUSCULAR | Status: AC
  Administered 2023-05-16: 25 ug via INTRAVENOUS
  Filled 2023-05-16: qty 0.5

## 2023-05-16 MED ORDER — CHLORHEXIDINE GLUCONATE 0.12 % MT SOLN
OROMUCOSAL | Status: AC
  Administered 2023-05-16: 15 mL via OROMUCOSAL
  Filled 2023-05-16: qty 15

## 2023-05-16 MED ORDER — PROPOFOL 10 MG/ML IV BOLUS
INTRAVENOUS | Status: AC
  Filled 2023-05-16: qty 20

## 2023-05-16 MED ORDER — LIDOCAINE-EPINEPHRINE (PF) 1 %-1:200000 IJ SOLN
INTRAMUSCULAR | Status: DC | PRN
Start: 2023-05-16 — End: 2023-05-16
  Administered 2023-05-16: 7 mL

## 2023-05-16 MED ORDER — PROPOFOL 10 MG/ML IV BOLUS
INTRAVENOUS | Status: DC | PRN
  Administered 2023-05-16 (×2): 30 mg via INTRAVENOUS

## 2023-05-16 MED ORDER — FENTANYL CITRATE (PF) 100 MCG/2ML IJ SOLN: 25.0000 ug | INTRAMUSCULAR | Status: DC | PRN

## 2023-05-16 MED ORDER — LIDOCAINE 2% (20 MG/ML) 5 ML SYRINGE
INTRAMUSCULAR | Status: DC | PRN
  Administered 2023-05-16: 40 mg via INTRAVENOUS

## 2023-05-16 MED ORDER — CEFAZOLIN SODIUM-DEXTROSE 2-4 GM/100ML-% IV SOLN
INTRAVENOUS | Status: AC
  Filled 2023-05-16: qty 100

## 2023-05-16 MED ORDER — HEMOSTATIC AGENTS (NO CHARGE) OPTIME
TOPICAL | Status: DC | PRN
Start: 2023-05-16 — End: 2023-05-16
  Administered 2023-05-16: 1 via TOPICAL

## 2023-05-16 MED ORDER — CHLORHEXIDINE GLUCONATE 4 % EX SOLN: 60.0000 mL | Freq: Once | CUTANEOUS | Status: DC

## 2023-05-16 MED ORDER — LIDOCAINE 2% (20 MG/ML) 5 ML SYRINGE
INTRAMUSCULAR | Status: AC
  Filled 2023-05-16: qty 5

## 2023-05-16 MED ORDER — FENTANYL CITRATE (PF) 100 MCG/2ML IJ SOLN
INTRAMUSCULAR | Status: AC
  Filled 2023-05-16: qty 2

## 2023-05-16 MED ORDER — HEPARIN 6000 UNIT IRRIGATION SOLUTION
Status: AC
  Filled 2023-05-16: qty 500

## 2023-05-16 MED ORDER — CEFAZOLIN SODIUM-DEXTROSE 2-4 GM/100ML-% IV SOLN
2.0000 g | INTRAVENOUS | Status: AC
  Administered 2023-05-16: 2 g via INTRAVENOUS
  Filled 2023-05-16: qty 100

## 2023-05-16 MED ORDER — PROPOFOL 500 MG/50ML IV EMUL
INTRAVENOUS | Status: DC | PRN
  Administered 2023-05-16: 65 ug/kg/min via INTRAVENOUS

## 2023-05-16 MED ORDER — SODIUM CHLORIDE 0.9 % IV SOLN: INTRAVENOUS | Status: DC

## 2023-05-16 MED ORDER — HYDRALAZINE HCL 20 MG/ML IJ SOLN
10.0000 mg | Freq: Once | INTRAMUSCULAR | Status: AC
  Administered 2023-05-16: 10 mg via INTRAVENOUS
  Filled 2023-05-16: qty 1
  Filled 2023-05-16: qty 0.5

## 2023-05-16 MED ORDER — PHENYLEPHRINE HCL-NACL 20-0.9 MG/250ML-% IV SOLN
INTRAVENOUS | Status: DC | PRN
  Administered 2023-05-16: 40 ug/min via INTRAVENOUS

## 2023-05-16 MED ORDER — 0.9 % SODIUM CHLORIDE (POUR BTL) OPTIME
TOPICAL | Status: DC | PRN
  Administered 2023-05-16: 1000 mL

## 2023-05-16 MED ORDER — HEPARIN 6000 UNIT IRRIGATION SOLUTION
Status: DC | PRN
  Administered 2023-05-16: 1

## 2023-05-16 MED ORDER — PROPOFOL 1000 MG/100ML IV EMUL
INTRAVENOUS | Status: AC
  Filled 2023-05-16: qty 100

## 2023-05-16 MED ORDER — ROPIVACAINE HCL 5 MG/ML IJ SOLN
INTRAMUSCULAR | Status: DC | PRN
Start: 2023-05-16 — End: 2023-05-16
  Administered 2023-05-16: 25 mL via PERINEURAL

## 2023-05-16 MED ORDER — ACETAMINOPHEN 10 MG/ML IV SOLN: 1000.0000 mg | Freq: Once | INTRAVENOUS | Status: DC | PRN

## 2023-05-16 MED ORDER — LIDOCAINE-EPINEPHRINE (PF) 1 %-1:200000 IJ SOLN
INTRAMUSCULAR | Status: AC
  Filled 2023-05-16: qty 30

## 2023-05-16 MED ORDER — HEPARIN SODIUM (PORCINE) 1000 UNIT/ML IJ SOLN
INTRAMUSCULAR | Status: AC
  Filled 2023-05-16: qty 10

## 2023-05-16 MED ORDER — FENTANYL CITRATE (PF) 250 MCG/5ML IJ SOLN
INTRAMUSCULAR | Status: DC | PRN
  Administered 2023-05-16: 50 ug via INTRAVENOUS

## 2023-05-16 MED ORDER — ONDANSETRON HCL 4 MG/2ML IJ SOLN
INTRAMUSCULAR | Status: AC
  Filled 2023-05-16: qty 2

## 2023-05-16 MED ORDER — FENTANYL CITRATE (PF) 250 MCG/5ML IJ SOLN
INTRAMUSCULAR | Status: AC
  Filled 2023-05-16: qty 5

## 2023-05-16 MED ORDER — EPHEDRINE 5 MG/ML INJ
INTRAVENOUS | Status: AC
  Filled 2023-05-16: qty 5

## 2023-05-16 MED ORDER — CHLORHEXIDINE GLUCONATE 0.12 % MT SOLN: 15.0000 mL | Freq: Once | OROMUCOSAL | Status: AC

## 2023-05-16 MED ORDER — PHENYLEPHRINE 80 MCG/ML (10ML) SYRINGE FOR IV PUSH (FOR BLOOD PRESSURE SUPPORT)
PREFILLED_SYRINGE | INTRAVENOUS | Status: AC
  Filled 2023-05-16: qty 10

## 2023-05-16 MED ORDER — HEPARIN SODIUM (PORCINE) 1000 UNIT/ML IJ SOLN
INTRAMUSCULAR | Status: DC | PRN
Start: 2023-05-16 — End: 2023-05-16
  Administered 2023-05-16: 3000 [IU] via INTRAVENOUS

## 2023-05-16 MED ORDER — TRAMADOL HCL 50 MG PO TABS
50.0000 mg | ORAL_TABLET | Freq: Four times a day (QID) | ORAL | 0 refills | Status: AC | PRN
Start: 2023-05-16 — End: ?

## 2023-05-16 SURGICAL SUPPLY — 40 items
ADH SKN CLS APL DERMABOND .7 (GAUZE/BANDAGES/DRESSINGS) ×1
AGENT HMST SPONGE THK3/8 (HEMOSTASIS)
ARMBAND PINK RESTRICT EXTREMIT (MISCELLANEOUS) ×2 IMPLANT
BAG COUNTER SPONGE SURGICOUNT (BAG) ×1 IMPLANT
BAG SPNG CNTER NS LX DISP (BAG) ×1
BLADE CLIPPER SURG (BLADE) ×1 IMPLANT
BNDG CMPR 5X4 KNIT ELC UNQ LF (GAUZE/BANDAGES/DRESSINGS) ×1
BNDG ELASTIC 4INX 5YD STR LF (GAUZE/BANDAGES/DRESSINGS) IMPLANT
CANISTER SUCT 3000ML PPV (MISCELLANEOUS) ×1 IMPLANT
CLIP TI MEDIUM 6 (CLIP) ×1 IMPLANT
CLIP TI WIDE RED SMALL 6 (CLIP) ×1 IMPLANT
COVER PROBE W GEL 5X96 (DRAPES) ×1 IMPLANT
DERMABOND ADVANCED .7 DNX12 (GAUZE/BANDAGES/DRESSINGS) ×1 IMPLANT
ELECT REM PT RETURN 9FT ADLT (ELECTROSURGICAL) ×1
ELECTRODE REM PT RTRN 9FT ADLT (ELECTROSURGICAL) ×1 IMPLANT
GLOVE BIO SURGEON STRL SZ7.5 (GLOVE) ×1 IMPLANT
GLOVE BIOGEL PI IND STRL 8 (GLOVE) ×1 IMPLANT
GOWN STRL REUS W/ TWL LRG LVL3 (GOWN DISPOSABLE) ×2 IMPLANT
GOWN STRL REUS W/ TWL XL LVL3 (GOWN DISPOSABLE) ×2 IMPLANT
GOWN STRL REUS W/TWL LRG LVL3 (GOWN DISPOSABLE) ×2
GOWN STRL REUS W/TWL XL LVL3 (GOWN DISPOSABLE) ×2
GRAFT GORETEX STRT 4-7X45 (Vascular Products) IMPLANT
HEMOSTAT SNOW SURGICEL 2X4 (HEMOSTASIS) IMPLANT
HEMOSTAT SPONGE AVITENE ULTRA (HEMOSTASIS) IMPLANT
KIT BASIN OR (CUSTOM PROCEDURE TRAY) ×1 IMPLANT
KIT TURNOVER KIT B (KITS) ×1 IMPLANT
NS IRRIG 1000ML POUR BTL (IV SOLUTION) ×1 IMPLANT
PACK CV ACCESS (CUSTOM PROCEDURE TRAY) ×1 IMPLANT
PAD ARMBOARD 7.5X6 YLW CONV (MISCELLANEOUS) ×2 IMPLANT
SLING ARM FOAM STRAP LRG (SOFTGOODS) IMPLANT
SLING ARM FOAM STRAP MED (SOFTGOODS) IMPLANT
SPIKE FLUID TRANSFER (MISCELLANEOUS) ×1 IMPLANT
SUT MNCRL AB 4-0 PS2 18 (SUTURE) ×1 IMPLANT
SUT PROLENE 6 0 BV (SUTURE) ×1 IMPLANT
SUT PROLENE 7 0 BV 1 (SUTURE) IMPLANT
SUT VIC AB 3-0 SH 27 (SUTURE) ×1
SUT VIC AB 3-0 SH 27X BRD (SUTURE) ×1 IMPLANT
TOWEL GREEN STERILE (TOWEL DISPOSABLE) ×1 IMPLANT
UNDERPAD 30X36 HEAVY ABSORB (UNDERPADS AND DIAPERS) ×1 IMPLANT
WATER STERILE IRR 1000ML POUR (IV SOLUTION) ×1 IMPLANT

## 2023-05-16 NOTE — H&P (Signed)
History and Physical Interval Note:  05/16/2023 2:21 PM  Julia Santana  has presented today for surgery, with the diagnosis of Chronic kidney disease, stage V.  The various methods of treatment have been discussed with the patient and family. After consideration of risks, benefits and other options for treatment, the patient has consented to  Procedure(s): LEFT ARM ARTERIOVENOUS (AV) FISTULA VERUS ARTERIOVENOUS GRAFT CREATION (Left) as a surgical intervention.  The patient's history has been reviewed, patient examined, no change in status, stable for surgery.  I have reviewed the patient's chart and labs.  Questions were answered to the patient's satisfaction.    Left arm AVF vs graft.  Liekly AVG given no previous surface vein when seen by Dr. Arbie Cookey.  Cephus Shelling     Patient name: Julia Santana  MRN: [id number]        DOB: 12/05/1940          Sex: female   REASON FOR CONSULT: Dialysis access placement   HPI: Julia Santana is a 82 y.o. female, with history of CKD stage IV/V, hypertension, hyperlipidemia, coronary artery disease status post CABG in 2013 that presents for evaluation of permanent dialysis access.  She is right-handed.  Patient has previously been evaluates by Dr. Arbie Cookey and she did not have any surface vein and he recommended left arm AV graft.  She is right handed.  She is not on dialysis at this time.  She is followed by Dr. Lacy Duverney with nephrology.  No chest wall implants.         Past Medical History:  Diagnosis Date   Anemia of chronic disease     Asthma     CKD (chronic kidney disease) stage 4, GFR 15-29 ml/min (HCC)     Coronary atherosclerosis of native coronary artery      Multivessel status post CABG 8/13   Essential hypertension     GERD (gastroesophageal reflux disease)     Gout     Hyperparathyroidism (HCC)     Mixed hyperlipidemia     Seasonal allergies                 Past Surgical History:  Procedure Laterality Date   CORONARY ARTERY  BYPASS GRAFT   03/28/2012    Procedure: CORONARY ARTERY BYPASS GRAFTING (CABG);  Surgeon: Kerin Perna, MD;  Location: [facility] OR;  Service: Open Heart Surgery;  Laterality: N/A;  times 3, using Left Internal Mammary and Right Greater Saphenous  Vein Graft harvested endoscopically   PARATHYROIDECTOMY   12/15/2011    Procedure: PARATHYROIDECTOMY;  Surgeon: Dalia Heading, MD;  Location: [facility] ORS;  Service: General;  Laterality: Bilateral;               Family History  Problem Relation Age of Onset   Hypertension Mother     Diabetes type II Mother     Diabetes type II Sister     Diabetes type II Brother            SOCIAL HISTORY: Social History         Socioeconomic History   Marital status: Married      Spouse name: SHERMAN   Number of children: Not on file   Years of education: Not on file   Highest education level: Not on file  Occupational History   Occupation: RETIRED      Employer: RETIRED  Tobacco Use   Smoking status: Former      Current packs/day:  0.00      Average packs/day: 1 pack/day for 20.0 years (20.0 ttl pk-yrs)      Types: Cigarettes      Start date: 08/28/1960      Quit date: 08/28/1980      Years since quitting: 42.6   Smokeless tobacco: Never   Tobacco comments:      quit smoking about 25 years ago  Vaping Use   Vaping status: Never Used  Substance and Sexual Activity   Alcohol use: No   Drug use: No   Sexual activity: Not on file  Other Topics Concern   Not on file  Social History Narrative   Not on file    Social Determinants of Health        Financial Resource Strain: Low Risk  (03/15/2023)    Overall Financial Resource Strain (CARDIA)     Difficulty of Paying Living Expenses: Not very hard  Food Insecurity: No Food Insecurity (08/07/2022)    Hunger Vital Sign     Worried About Running Out of Food in the Last Year: Never true     Ran Out of Food in the Last Year: Never true  Transportation Needs: No Transportation Needs (03/15/2023)    PRAPARE  - Therapist, art (Medical): No     Lack of Transportation (Non-Medical): No  Physical Activity: Not on file  Stress: Not on file  Social Connections: Not on file  Intimate Partner Violence: Not on file      Allergies       Allergies  Allergen Reactions   Cortisone        Knot that's lasted 1 year and leg pain   Furosemide Cough              Current Outpatient Medications  Medication Sig Dispense Refill   aspirin 81 MG tablet Take 81 mg by mouth daily.       atorvastatin (LIPITOR) 40 MG tablet Take 1 tablet (40 mg total) by mouth every evening. 30 tablet 11   cloNIDine (CATAPRES) 0.1 MG tablet Take 1 tablet (0.1 mg total) by mouth 2 (two) times daily. 60 tablet 6   hydrALAZINE (APRESOLINE) 50 MG tablet Take 50 mg by mouth 2 (two) times daily.       isosorbide mononitrate (IMDUR) 30 MG 24 hr tablet Take 1 tablet by mouth once daily 90 tablet 1   metoprolol succinate (TOPROL-XL) 100 MG 24 hr tablet Take 100 mg by mouth daily.       pantoprazole (PROTONIX) 40 MG tablet Take 40 mg by mouth 2 (two) times daily.        torsemide (DEMADEX) 100 MG tablet Take 1/2 (one-half) tablet by mouth once daily 45 tablet 1      No current facility-administered medications for this visit.        REVIEW OF SYSTEMS:  [X]  denotes positive finding, [ ]  denotes negative finding Cardiac   Comments:  Chest pain or chest pressure:      Shortness of breath upon exertion:      Short of breath when lying flat:      Irregular heart rhythm:             Vascular      Pain in calf, thigh, or hip brought on by ambulation:      Pain in feet at night that wakes you up from your sleep:       Blood clot in your veins:  Leg swelling:              Pulmonary      Oxygen at home:      Productive cough:       Wheezing:              Neurologic      Sudden weakness in arms or legs:       Sudden numbness in arms or legs:       Sudden onset of difficulty speaking or slurred  speech:      Temporary loss of vision in one eye:       Problems with dizziness:              Gastrointestinal      Blood in stool:       Vomited blood:              Genitourinary      Burning when urinating:       Blood in urine:             Psychiatric      Major depression:              Hematologic      Bleeding problems:      Problems with blood clotting too easily:             Skin      Rashes or ulcers:             Constitutional      Fever or chills:          PHYSICAL EXAM: There were no vitals filed for this visit.   GENERAL: The patient is a well-nourished female, in no acute distress. The vital signs are documented above. CARDIAC: There is a regular rate and rhythm.  VASCULAR:  Bilateral brachial pulses palpable Bilateral radial pulses weakly palpable PULMONARY: No respiratory distresss ABDOMEN: Soft and non-tender. MUSCULOSKELETAL: There are no major deformities or cyanosis. NEUROLOGIC: No focal weakness or paresthesias are detected. SKIN: There are no ulcers or rashes noted. PSYCHIATRIC: The patient has a normal affect.   DATA:    No new vein mapping   Assessment/Plan:   82 y.o. female, with history of CKD stage IV/V, hypertension, hyperlipidemia, coronary artery disease status post CABG in 2013 that presents for evaluation of permanent dialysis access.  Discussed that Dr. Arbie Cookey previously evaluated her arms with [employer] and she had no good surface veins and he recommended AV graft.  Patient is right-handed and I recommended access in the nondominant arm which would be her left arm.    I discussed that I would reevaluate her left arm in the operating room to look for usable surface veins with ultrasound but again would anticipate AV graft placement.  I discussed with AVG higher risk of infection and has poorer durability.  I have asked that she confirm with her nephrologist Dr. Kathrene Bongo that it is appropriate to proceed given she is not on dialysis at  this time although she has worsening GFR per her notes.  Our office will call and get her scheduled at her convenience.  Discussed risk of bleeding, infection, steal syndrome, failure to mature.  Discussed this being done as an outpatient at [facility].     Cephus Shelling, MD [facility] Office: [phone]

## 2023-05-16 NOTE — Discharge Instructions (Signed)
[facility]  Discharge Instructions  AV Fistula or Graft Surgery for Dialysis Access  Please refer to the following instructions for your post-procedure care. Your surgeon or physician assistant will discuss any changes with you.  Activity  You may drive the day following your surgery, if you are comfortable and no longer taking prescription pain medication. Resume full activity as the soreness in your incision resolves.  Bathing/Showering  You may shower after you go home. Keep your incision dry for 48 hours. Do not soak in a bathtub, hot tub, or swim until the incision heals completely. You may not shower if you have a hemodialysis catheter.  Incision Care  Clean your incision with mild soap and water after 48 hours. Pat the area dry with a clean towel. You do not need a bandage unless otherwise instructed. Do not apply any ointments or creams to your incision. You may have skin glue on your incision. Do not peel it off. It will come off on its own in about one week. Your arm may swell a bit after surgery. To reduce swelling use pillows to elevate your arm so it is above your heart. Your doctor will tell you if you need to lightly wrap your arm with an ACE bandage.  Diet  Resume your normal diet. There are not special food restrictions following this procedure. In order to heal from your surgery, it is CRITICAL to get adequate nutrition. Your body requires vitamins, minerals, and protein. Vegetables are the best source of vitamins and minerals. Vegetables also provide the perfect balance of protein. Processed food has little nutritional value, so try to avoid this.  Medications  Resume taking all of your medications. If your incision is causing pain, you may take over-the counter pain relievers such as acetaminophen (Tylenol). If you were prescribed a stronger pain medication, please be aware these medications can cause nausea and constipation. Prevent  nausea by taking the medication with a snack or meal. Avoid constipation by drinking plenty of fluids and eating foods with high amount of fiber, such as fruits, vegetables, and grains. Do not take Tylenol if you are taking prescription pain medications.     Follow up Your surgeon may want to see you in the office following your access surgery. If so, this will be arranged at the time of your surgery.  Please call us immediately for any of the following conditions:  Increased pain, redness, drainage (pus) from your incision site Fever of 101 degrees or higher Severe or worsening pain at your incision site Hand pain or numbness.  Reduce your risk of vascular disease:  Stop smoking. If you would like help, call [facility] at 1-800-QUIT-NOW (8384439849) or [place] at [phone]  Manage your cholesterol Maintain a desired weight Control your diabetes Keep your blood pressure down  Dialysis  It will take several weeks to several months for your new dialysis access to be ready for use. Your surgeon will determine when it is OK to use it. Your nephrologist will continue to direct your dialysis. You can continue to use your Permcath until your new access is ready for use.  If you have any questions, please call the office at [phone].

## 2023-05-16 NOTE — Anesthesia Postprocedure Evaluation (Signed)
Anesthesia Post Note  Patient: Julia Santana  Procedure(s) Performed: INSERTION OF ARTERIOVENOUS GRAFT LEFT ARM (Left: Arm Upper)     Patient location during evaluation: PACU Anesthesia Type: Regional Level of consciousness: awake and alert Pain management: pain level controlled Vital Signs Assessment: post-procedure vital signs reviewed and stable Respiratory status: spontaneous breathing, nonlabored ventilation, respiratory function stable and patient connected to nasal cannula oxygen Cardiovascular status: stable and blood pressure returned to baseline Postop Assessment: no apparent nausea or vomiting Anesthetic complications: no   No notable events documented.  Last Vitals:  Vitals:   05/16/23 1715 05/16/23 1730  BP: (!) 175/93 (!) 169/64  Pulse: 69 67  Resp: (!) 25 (!) 22  Temp:  (!) 36.2 C  SpO2: 100% 100%    Last Pain:  Vitals:   05/16/23 1706  TempSrc:   PainSc: 0-No pain                 [name]

## 2023-05-16 NOTE — Op Note (Signed)
OPERATIVE NOTE  DATE: May 16, 2023  PROCEDURE:  left upper arm arteriovenous graft (4 mm x 7 mm gore-tex graft)   PRE-OPERATIVE DIAGNOSIS: CKD stage 5  POST-OPERATIVE DIAGNOSIS: same  SURGEON: Cephus Shelling, MD  ASSISTANT(S): Aggie Moats, PA  ANESTHESIA: regional  ESTIMATED BLOOD LOSS: <75 mL  FINDING(S): Left upper arm AVG with excellent thrill and palpable radial pulse at the wrist at completion.  SPECIMEN(S):  None  INDICATIONS:   Julia Santana is a 82 y.o. female who presents with CKD stage 5 with continued decline in her renal function.  Pre-operative evaluation showed no good surface vein and discussed high likelihood for AVG.  Risk, benefits, and alternatives to access surgery were discussed.  The patient is aware the risks include but are not limited to: bleeding, infection, steal syndrome, nerve damage, ischemic monomelic neuropathy, failure to mature, and need for additional procedures.  The patient is aware of the risks and elects to proceed forward.  An assistant was needed given the complexity of the case and to sew the anastomosis.    DESCRIPTION: After full informed written consent was obtained from the patient, the patient was brought back to the operating room and placed supine upon the operating table.  The patient was given IV antibiotics prior to proceeding.  After obtaining adequate sedation, the patient was prepped and draped in standard fashion for a left arm access procedure.  I evaluated all of the surface veins with ultrasound and they were small as indicated on pre-operative vein mapping.  I turned my attention first to the antecubitum.  Under ultrasound guidance, I identified the location of the brachial artery and marked it on the skin.  I then looked on the upper arm near the axilla and marked the brachial vein as well as axillary vein. I injected 1% lidocaine without epinephrine in the axilla for about 7 mL total.  I made a longitudinal  incision over the brachial artery above the antecubitum and another longitudinal incision over the brachial vein near the axillary vein.  I dissected down through the subcutaneous tissue and  fascia carefully and was able to dissect out the brachial artery.  The artery was about 3.5 mm externally.  It was controlled proximally and distally with vessel loops.  I then dissected out the larger brachial vein through the upper arm incision.  Externally, it appeared to be 5 mm in diameter.  I then dissected this vein proximally and distal.  II took a metal Gore tunneler and dissected from the antecubital incision to the axillary incision.  Then I delivered the 4 x 7-mm stretch Gore-Tex graft, through this metal tunneler and then pulled out the metal tunneler leaving the graft in place.  The 4-mm end was left on the brachial artery side and the 7-mm end toward the vein side.  I then gave the patient 3,000 units of heparin to gain anticoagulation.  After waiting 2 minutes, I placed the brachial artery under tension proximally and distally with vessel loops, made an arteriotomy and extended it with a Potts scissor.  I sewed the 4-mm end of the graft to this arteriotomy with a running stitch of 6-0 Prolene with the help of my assistant.  At this point, then I completed the anastomosis in the usual fashion.  I released the vessel loops on the inflow and allowed the artery to decompress through the graft. There was good pulsatile bleeding through this graft.  I clamped the graft near its  arterial anastomosis and sucked out all the blood in the graft and loaded the graft with heparinized saline.  At this point, I pulled the graft to appropriate length and reset my exposure of the high brachial vein. The vein was controlled with Henle clamps and opened with 11 blade scalpel and extended with Potts scissors.  There was good venous backbleeding from the vein. I spatulated the graft to facilitate an end-to-side anastomosis.  In the  process of spatulating, I cut the graft to appropriate length for this anastomosis.  This graft was sewn to the vein in an end-to-side configuration with a 6-0 Prolene with the help of my assistant.  Prior to completing this anastomosis, I allowed the vein to back bleed and then I also allowed the artery to bleed in an antegrade fashion.  I completed this anastomosis in the usual fashion.  I irrigated out both incisions.  I used surgicel snow to get hemostasis.   The graft had a good palpable thrill.  The radial and ulnar artery has good signals.  There was a palpable radial pulse at the end of the case.  The subcutaneous tissue in each incision was reapproximated with a running stitch of 3-0 Vicryl.  The skin was then reapproximated with a running subcuticular 4-0 Monocryl.  The skin was then cleaned, dried, and Dermabond used to reinforce the skin closure.   COMPLICATIONS: None  CONDITION: Stable  Cephus Shelling, MD [facility] Office: [phone]  Cephus Shelling    05/16/2023, 4:42 PM

## 2023-05-16 NOTE — Anesthesia Procedure Notes (Signed)
Anesthesia Regional Block: Interscalene brachial plexus block   Pre-Anesthetic Checklist: , timeout performed,  Correct Patient, Correct Site, Correct Laterality,  Correct Procedure, Correct Position, site marked,  Risks and benefits discussed,  Surgical consent,  Pre-op evaluation,  At surgeon's request and post-op pain management  Laterality: Upper and Left  Prep: Maximum Sterile Barrier Precautions used, chloraprep       Needles:  Injection technique: Single-shot  Needle Type: Echogenic Needle     Needle Length: 5cm  Needle Gauge: 21     Additional Needles:   Procedures:,,,, ultrasound used (permanent image in chart),,    Narrative:  Start time: 05/16/2023 1:20 PM End time: 05/16/2023 1:28 PM Injection made incrementally with aspirations every 5 mL.  Performed by: Personally  Anesthesiologist: [name], MD  Additional Notes: Block assessed prior to procedure. Patient tolerated procedure well.

## 2023-05-16 NOTE — Anesthesia Preprocedure Evaluation (Addendum)
Anesthesia Evaluation  Patient identified by MRN, date of birth, ID band Patient awake    Reviewed: Allergy & Precautions, NPO status , Patient's Chart, lab work & pertinent test results  Airway Mallampati: I  TM Distance: >3 FB Neck ROM: Full    Dental no notable dental hx. (+) Edentulous Upper,    Pulmonary former smoker   Pulmonary exam normal breath sounds clear to auscultation       Cardiovascular hypertension, Pt. on medications + CABG and + Peripheral Vascular Disease  Normal cardiovascular exam Rhythm:Regular Rate:Normal  08/2019 Echo 1. Left ventricular ejection fraction, by visual estimation, is 65 to  70%. The left ventricle has normal function. There is borderline left  ventricular hypertrophy.   2. Left ventricular diastolic parameters are consistent with Grade I  diastolic dysfunction (impaired relaxation).   3. The left ventricle has no regional wall motion abnormalities.   4. Global right ventricle has normal systolic function.The right  ventricular size is normal. No increase in right ventricular wall  thickness.   5. Left atrial size was upper normal.   6. Right atrial size was normal.   7. The mitral valve is grossly normal. Mild mitral valve regurgitation.   8. The tricuspid valve is grossly normal.   9. The tricuspid valve is grossly normal. Tricuspid valve regurgitation  is trivial.  10. The aortic valve is tricuspid. Aortic valve regurgitation is not  visualized.  11. The pulmonic valve was grossly normal. Pulmonic valve regurgitation is  not visualized.  12. Normal pulmonary artery systolic pressure.  13. The tricuspid regurgitant velocity is 1.93 m/s, and with an assumed  right atrial pressure of 3 mmHg, the estimated right ventricular systolic  pressure is normal at 17.9 mmHg.  14. The inferior vena cava is normal in size with greater than 50%  respiratory variability, suggesting right atrial  pressure of 3 mmHg.     Neuro/Psych    GI/Hepatic ,GERD  ,,  Endo/Other    Renal/GU Renal diseaseLab Results      Component                Value               Date                      NA                       134 (L)             04/23/2023                CL                       101                 04/23/2023                K                        3.6                 04/23/2023                CREATININE               2.26 (H)            04/23/2023  Musculoskeletal   Abdominal   Peds  Hematology  (+) Blood dyscrasia, anemia Lab Results      Component                Value               Date                      WBC                      6.0                 04/23/2023                HGB                      9.8 (L)             04/23/2023                HCT                      31.3 (L)            04/23/2023                MCV                      88.7                04/23/2023                PLT                      243                 04/23/2023              Anesthesia Other Findings All; Furosemide, Cortisone  Reproductive/Obstetrics                              Anesthesia Physical Anesthesia Plan  ASA: 3  Anesthesia Plan: Regional and General   Post-op Pain Management: Regional block* and Sufentanil infusion   Induction:   PONV Risk Score and Plan: Treatment may vary due to age or medical condition and Propofol infusion  Airway Management Planned: Natural Airway and Nasal Cannula  Additional Equipment: None  Intra-op Plan:   Post-operative Plan:   Informed Consent:      Dental advisory given  Plan Discussed with:   Anesthesia Plan Comments: (L [place] block)         Anesthesia Quick Evaluation

## 2023-05-16 NOTE — Transfer of Care (Signed)
Immediate Anesthesia Transfer of Care Note  Patient: Julia Santana  Procedure(s) Performed: INSERTION OF ARTERIOVENOUS GRAFT LEFT ARM (Left: Arm Upper)  Patient Location: PACU  Anesthesia Type:MAC and Regional  Level of Consciousness: awake, oriented, and patient cooperative  Airway & Oxygen Therapy: Patient Spontanous Breathing and Patient connected to face mask oxygen  Post-op Assessment: Report given to RN and Post -op Vital signs reviewed and stable  Post vital signs: Reviewed and stable  Last Vitals:  Vitals Value Taken Time  BP 149/59 05/16/23 1706  Temp 36.2 C 05/16/23 1706  Pulse 69 05/16/23 1706  Resp 17 05/16/23 1708  SpO2 100 % 05/16/23 1706  Vitals shown include unfiled device data.  Last Pain:  Vitals:   05/16/23 1300  TempSrc:   PainSc: 0-No pain         Complications: No notable events documented.

## 2023-05-16 NOTE — Progress Notes (Signed)
Patient's B/P elevated.  Dr. Richardson Landry in room and orders received.  Will continue to monitor.

## 2023-05-17 ENCOUNTER — Telehealth: Payer: Self-pay | Admitting: Vascular Surgery

## 2023-05-17 ENCOUNTER — Encounter (HOSPITAL_COMMUNITY): Payer: Self-pay | Admitting: Vascular Surgery

## 2023-05-18 NOTE — Telephone Encounter (Signed)
Pt appt scheduled

## 2023-06-12 ENCOUNTER — Ambulatory Visit (INDEPENDENT_AMBULATORY_CARE_PROVIDER_SITE_OTHER): Payer: Medicare HMO | Admitting: Physician Assistant

## 2023-06-12 VITALS — BP 202/64 | HR 72 | Temp 97.9°F | Resp 18 | Ht 64.0 in

## 2023-06-12 DIAGNOSIS — N185 Chronic kidney disease, stage 5: Secondary | ICD-10-CM

## 2023-06-12 NOTE — Progress Notes (Signed)
    Postoperative Access Visit   History of Present Illness   Julia Santana is a 82 y.o. year old female who presents with her son for postoperative follow-up for: left upper arm AV graft by Dr. Chestine Spore on 05/16/23. The patient's wounds are healing well. She is having some numbness and aching in her forearm and hand. Also reports some tingling and burning in her palm and fingers. Her son reports that she has been favoring the arm and very worried to exercise or use it. However they do feel it is gradually getting better. She has not cleaned over the incisions as she says she did not know that she could. She is not currently on HD. She is unsure who her Nephrologist is.   Physical Examination   Vitals:   06/12/23 0922  BP: (!) 202/64  Pulse: 72  Resp: 18  Temp: 97.9 F (36.6 C)  SpO2: 97%  Height: 5\' 4"  (1.626 m)   Body mass index is 25.43 kg/m.  left arm Incisions are healing well, Dermabond still present. Radial pulse is not palpable but brisk doppler signal, no ulnar signal. Palmar signals present. hand grip is 3/5, hand is warm. sensation in digits is intact,palpable thrill, bruit can be auscultated     Medical Decision Making   Julia Santana is a 82 y.o. year old female who presents s/p left upper arm AV graft by Dr. Chestine Spore on 05/16/23. Her incisions are healing well. Gave instructions on washing incisions with mild soap and water. She is having some decreased motor and sensation in the left hand. This however is improving per patient and her son. I discussed options of ligating the graft vs giving her a few more weeks to see if it continues to improve. No signs of acute ischemia. Doppler radial and palmar signals. I provided her with exercise ball to try to exercise her hand. She is not currently on HD. Her son will figure out who her Nephrologist is and arrange follow up. The patient's access will be ready for use 06/15/23. I will bring her back in 4-6 weeks to recheck her arm.  Discussed that if her arm and hand gets worse to call for earlier follow up.   Graceann Congress, PA-C [facility] [place] Office: [phone]  Clinic MD: Steve Rattler

## 2023-06-13 ENCOUNTER — Encounter (HOSPITAL_COMMUNITY): Payer: Self-pay | Admitting: Nephrology

## 2023-06-14 ENCOUNTER — Ambulatory Visit: Payer: Medicare HMO | Admitting: Cardiology

## 2023-06-22 ENCOUNTER — Other Ambulatory Visit: Payer: Self-pay | Admitting: Cardiology

## 2023-06-25 ENCOUNTER — Encounter: Payer: Self-pay | Admitting: Cardiology

## 2023-06-25 ENCOUNTER — Ambulatory Visit: Payer: Medicare HMO | Attending: Cardiology | Admitting: Cardiology

## 2023-06-25 VITALS — BP 142/58 | HR 58 | Ht 64.0 in | Wt 119.6 lb

## 2023-06-25 DIAGNOSIS — E782 Mixed hyperlipidemia: Secondary | ICD-10-CM

## 2023-06-25 DIAGNOSIS — I25119 Atherosclerotic heart disease of native coronary artery with unspecified angina pectoris: Secondary | ICD-10-CM | POA: Diagnosis not present

## 2023-06-25 DIAGNOSIS — I10 Essential (primary) hypertension: Secondary | ICD-10-CM | POA: Diagnosis not present

## 2023-06-25 DIAGNOSIS — N185 Chronic kidney disease, stage 5: Secondary | ICD-10-CM | POA: Diagnosis not present

## 2023-06-25 MED ORDER — CLONIDINE HCL 0.1 MG PO TABS: 0.1000 mg | ORAL_TABLET | Freq: Two times a day (BID) | ORAL | 6 refills | Status: DC

## 2023-06-25 NOTE — Progress Notes (Signed)
Cardiology Office Note  Date: 06/25/2023   ID: Julia Santana, DOB Jan 08, 1941, MRN [id number]  History of Present Illness: Julia Santana is an 82 y.o. female last seen in April.  She is here today with her son for a follow-up visit.  Reports being weak, relatively low appetite.  She has had progressive kidney disease and is recently status post left arm AV graft in anticipation of hemodialysis.  She has follow-up pending with nephrology and also VVS.  She has prior intolerances to higher dose hydralazine (decreased appetite) and Norvasc (leg swelling).  Not on ARB/ACE inhibitor/MRA at this point due to progressive renal insufficiency.  Current regimen includes clonidine, hydralazine, Imdur, and Demadex.  I rechecked her blood pressure in the right arm as noted below.  Physical Exam: VS:  BP (!) 142/58   Pulse (!) 58   Ht 5\' 4"  (1.626 m)   Wt 119 lb 9.6 oz (54.3 kg)   SpO2 98%   BMI 20.53 kg/m , BMI Body mass index is 20.53 kg/m.  Wt Readings from Last 3 Encounters:  06/25/23 119 lb 9.6 oz (54.3 kg)  05/16/23 148 lb 2.4 oz (67.2 kg)  04/23/23 154 lb 5.2 oz (70 kg)    General: Patient appears comfortable at rest. HEENT: Conjunctiva and lids normal. Neck: Supple, no elevated JVP. Lungs: Clear to auscultation, nonlabored breathing at rest. Cardiac: Regular rate and rhythm, no S3, 2/6 systolic murmur. Extremities: Comparing leg edema.  Left upper arm AV graft with palpable thrill.  ECG:  An ECG dated 12/07/2022 was personally reviewed today and demonstrated:  Sinus bradycardia with prolonged PR interval, decreased R wave progression rule out old anterior infarct pattern, nonspecific T wave changes.  Labwork: 04/23/2023: ALT 16; AST 25; Platelets 243 05/16/2023: BUN 43; Creatinine, Ser 4.70; Hemoglobin 9.9; Potassium 3.7; Sodium 134  April 2024: Cholesterol 129, triglycerides 69, HDL 48, LDL 67  Other Studies Reviewed Today:  No interval cardiac testing for review  today.  Assessment and Plan:  1.  Multivessel CAD status post CABG in 2013 with LIMA to LAD, SVG to OM, and SVG to OM 2.  Echocardiogram in 2021 revealed LVEF 65 to 70%.  Lexiscan Myoview at that time revealed variable soft tissue attenuation at the apex versus minor ischemic territory.  She does not report any angina with low-level activity.  Continue aspirin and Lipitor.   2.  Primary hypertension.  Currently on clonidine, hydralazine, Imdur, and Toprol-XL with other intolerances as discussed above.  Refill provided for clonidine today.   3.  CKD stage V.  Continues to follow with nephrology.  Recently status post left arm AV graft in anticipation of hemodialysis.   4.  Mixed hyperlipidemia.  LDL 67 in April.  Continue Lipitor.   5.  Asymptomatic carotid artery disease.  Mild to moderate disease by Dopplers in January.  Continue aspirin and statin.  Disposition:  Follow up  6 months.  Signed, Jonelle Sidle, M.D., F.A.C.C. [facility]

## 2023-06-25 NOTE — Patient Instructions (Addendum)

## 2023-06-28 DIAGNOSIS — E213 Hyperparathyroidism, unspecified: Secondary | ICD-10-CM | POA: Diagnosis not present

## 2023-06-28 DIAGNOSIS — E782 Mixed hyperlipidemia: Secondary | ICD-10-CM | POA: Diagnosis not present

## 2023-06-28 DIAGNOSIS — K219 Gastro-esophageal reflux disease without esophagitis: Secondary | ICD-10-CM | POA: Diagnosis not present

## 2023-07-03 DIAGNOSIS — E049 Nontoxic goiter, unspecified: Secondary | ICD-10-CM | POA: Diagnosis not present

## 2023-07-03 DIAGNOSIS — E7849 Other hyperlipidemia: Secondary | ICD-10-CM | POA: Diagnosis not present

## 2023-07-03 DIAGNOSIS — K219 Gastro-esophageal reflux disease without esophagitis: Secondary | ICD-10-CM | POA: Diagnosis not present

## 2023-07-03 DIAGNOSIS — E213 Hyperparathyroidism, unspecified: Secondary | ICD-10-CM | POA: Diagnosis not present

## 2023-07-03 DIAGNOSIS — R609 Edema, unspecified: Secondary | ICD-10-CM | POA: Diagnosis not present

## 2023-07-03 DIAGNOSIS — J45909 Unspecified asthma, uncomplicated: Secondary | ICD-10-CM | POA: Diagnosis not present

## 2023-07-03 DIAGNOSIS — M1000 Idiopathic gout, unspecified site: Secondary | ICD-10-CM | POA: Diagnosis not present

## 2023-07-03 DIAGNOSIS — I10 Essential (primary) hypertension: Secondary | ICD-10-CM | POA: Diagnosis not present

## 2023-07-03 DIAGNOSIS — I5031 Acute diastolic (congestive) heart failure: Secondary | ICD-10-CM | POA: Diagnosis not present

## 2023-07-09 DIAGNOSIS — E213 Hyperparathyroidism, unspecified: Secondary | ICD-10-CM | POA: Diagnosis not present

## 2023-07-09 DIAGNOSIS — Z95828 Presence of other vascular implants and grafts: Secondary | ICD-10-CM | POA: Diagnosis not present

## 2023-07-09 DIAGNOSIS — I129 Hypertensive chronic kidney disease with stage 1 through stage 4 chronic kidney disease, or unspecified chronic kidney disease: Secondary | ICD-10-CM | POA: Diagnosis not present

## 2023-07-09 DIAGNOSIS — N184 Chronic kidney disease, stage 4 (severe): Secondary | ICD-10-CM | POA: Diagnosis not present

## 2023-07-09 DIAGNOSIS — D631 Anemia in chronic kidney disease: Secondary | ICD-10-CM | POA: Diagnosis not present

## 2023-07-09 DIAGNOSIS — R609 Edema, unspecified: Secondary | ICD-10-CM | POA: Diagnosis not present

## 2023-07-17 ENCOUNTER — Encounter: Payer: Self-pay | Admitting: Physician Assistant

## 2023-07-17 ENCOUNTER — Ambulatory Visit: Payer: Medicare HMO | Admitting: Physician Assistant

## 2023-07-17 VITALS — BP 187/79 | HR 65 | Temp 97.9°F | Resp 18 | Ht 64.0 in | Wt 126.0 lb

## 2023-07-17 DIAGNOSIS — E785 Hyperlipidemia, unspecified: Secondary | ICD-10-CM | POA: Diagnosis not present

## 2023-07-17 DIAGNOSIS — L89309 Pressure ulcer of unspecified buttock, unspecified stage: Secondary | ICD-10-CM | POA: Diagnosis not present

## 2023-07-17 DIAGNOSIS — I5042 Chronic combined systolic (congestive) and diastolic (congestive) heart failure: Secondary | ICD-10-CM | POA: Diagnosis not present

## 2023-07-17 DIAGNOSIS — N189 Chronic kidney disease, unspecified: Secondary | ICD-10-CM | POA: Diagnosis not present

## 2023-07-17 DIAGNOSIS — N185 Chronic kidney disease, stage 5: Secondary | ICD-10-CM

## 2023-07-17 DIAGNOSIS — J45909 Unspecified asthma, uncomplicated: Secondary | ICD-10-CM | POA: Diagnosis not present

## 2023-07-17 DIAGNOSIS — M109 Gout, unspecified: Secondary | ICD-10-CM | POA: Diagnosis not present

## 2023-07-17 DIAGNOSIS — N184 Chronic kidney disease, stage 4 (severe): Secondary | ICD-10-CM | POA: Diagnosis not present

## 2023-07-17 DIAGNOSIS — E049 Nontoxic goiter, unspecified: Secondary | ICD-10-CM | POA: Diagnosis not present

## 2023-07-17 DIAGNOSIS — I509 Heart failure, unspecified: Secondary | ICD-10-CM | POA: Diagnosis not present

## 2023-07-17 DIAGNOSIS — E213 Hyperparathyroidism, unspecified: Secondary | ICD-10-CM | POA: Diagnosis not present

## 2023-07-17 DIAGNOSIS — N289 Disorder of kidney and ureter, unspecified: Secondary | ICD-10-CM | POA: Diagnosis not present

## 2023-07-17 DIAGNOSIS — R011 Cardiac murmur, unspecified: Secondary | ICD-10-CM | POA: Diagnosis not present

## 2023-07-17 DIAGNOSIS — L89153 Pressure ulcer of sacral region, stage 3: Secondary | ICD-10-CM | POA: Diagnosis not present

## 2023-07-17 DIAGNOSIS — L89152 Pressure ulcer of sacral region, stage 2: Secondary | ICD-10-CM | POA: Diagnosis not present

## 2023-07-17 DIAGNOSIS — I130 Hypertensive heart and chronic kidney disease with heart failure and stage 1 through stage 4 chronic kidney disease, or unspecified chronic kidney disease: Secondary | ICD-10-CM | POA: Diagnosis not present

## 2023-07-17 DIAGNOSIS — G629 Polyneuropathy, unspecified: Secondary | ICD-10-CM | POA: Diagnosis not present

## 2023-07-17 NOTE — Progress Notes (Signed)
POST OPERATIVE OFFICE NOTE    CC:  F/u for surgery  HPI:  This is a 82 y.o. female who is s/p LUA AVG on 05/16/2023 by Dr. Chestine Spore.   She was seen on 06/12/2023 and at that time, she was having some numbness and aching in the forearm and hand but felt it was improving.  She was scheduled for follow up to recheck her arm.   She is here today with her son.    Pt states she continues to have pain and numbness in the left hand.  She states that it also stays cold.  This has been present since her access surgery.  She does not have any problems with the right hand.  She states she cannot hold her dishes with the left hand and was able to prior to surgery.    She is not yet on HD but feels it is going to happen in the near future.      Allergies  Allergen Reactions   Cortisone     Knot that's lasted 1 year and leg pain   Furosemide Cough    Current Outpatient Medications  Medication Sig Dispense Refill   aspirin 81 MG tablet Take 162 mg by mouth daily.     atorvastatin (LIPITOR) 40 MG tablet Take 1 tablet (40 mg total) by mouth every evening. 30 tablet 11   cloNIDine (CATAPRES) 0.1 MG tablet Take 1 tablet (0.1 mg total) by mouth 2 (two) times daily. 60 tablet 6   famotidine (PEPCID) 20 MG tablet Take 1 tablet (20 mg total) by mouth 2 (two) times daily. (Patient taking differently: Take 20 mg by mouth 3 (three) times daily.) 60 tablet 0   hydrALAZINE (APRESOLINE) 50 MG tablet Take 50 mg by mouth 3 (three) times daily.     isosorbide mononitrate (IMDUR) 30 MG 24 hr tablet Take 1 tablet by mouth once daily 90 tablet 1   metoprolol succinate (TOPROL-XL) 100 MG 24 hr tablet Take 100 mg by mouth daily.     pantoprazole (PROTONIX) 40 MG tablet Take 40 mg by mouth 2 (two) times daily.      torsemide (DEMADEX) 100 MG tablet Take 1/2 (one-half) tablet by mouth once daily 45 tablet 1   traMADol (ULTRAM) 50 MG tablet Take 1 tablet (50 mg total) by mouth every 6 (six) hours as needed for up to 20 doses  for severe pain. 20 tablet 0   No current facility-administered medications for this visit.     ROS:  See HPI  Physical Exam:  Today's Vitals   07/17/23 0841  BP: (!) 187/79  Pulse: 65  Resp: 18  Temp: 97.9 F (36.6 C)  TempSrc: Temporal  SpO2: 98%  Weight: 126 lb (57.2 kg)  Height: 5\' 4"  (1.626 m)  PainSc: 0-No pain   Body mass index is 21.63 kg/m.   Incision:  both incisions are well healed Extremities:   There is not a palpable left radial pulse.  She has a monophasic left radial doppler signal that does minimally augments with compression of the graft.  She has a palmar arch signal that does slightly augment with compression of the graft.  She has decreased sensation of the fingers in the left hand and hand grip on the left is 2-3/5 and 5/5 on the right  There is a thrill/bruit present.  Access is  easily palpable      Assessment/Plan:  This is a 82 y.o. female who is s/p: LUA AVG on  05/16/2023 by Dr. Chestine Spore.   -the pt does  have evidence of steal.  Long discussion with pt and her son about ligation of graft.  She does not want to have anymore surgery.  Discussed if we ligated the graft and went to the right arm, she would be at risk for steal in that arm as well.  After long discussion, she would like to not ligate the graft and start dialysis.  Discussed with them that if her pain worsens or she develops ulcerations on the left hand, her graft would need to be ligated.  Discussed that she can wear gloves to keep her hands warm.   -pt's access can be used now. -discussed with pt that access does not last forever and will need intervention or even new access at some point.  -the pt will follow up as needed   Doreatha Massed, [facility] [phone]  Clinic MD:  Karin Lieu

## 2023-07-20 ENCOUNTER — Telehealth: Payer: Self-pay | Admitting: Cardiology

## 2023-07-20 DIAGNOSIS — J45909 Unspecified asthma, uncomplicated: Secondary | ICD-10-CM | POA: Diagnosis not present

## 2023-07-20 DIAGNOSIS — M109 Gout, unspecified: Secondary | ICD-10-CM | POA: Diagnosis not present

## 2023-07-20 DIAGNOSIS — D631 Anemia in chronic kidney disease: Secondary | ICD-10-CM | POA: Diagnosis not present

## 2023-07-20 DIAGNOSIS — L89152 Pressure ulcer of sacral region, stage 2: Secondary | ICD-10-CM | POA: Diagnosis not present

## 2023-07-20 DIAGNOSIS — N186 End stage renal disease: Secondary | ICD-10-CM | POA: Diagnosis not present

## 2023-07-20 DIAGNOSIS — K219 Gastro-esophageal reflux disease without esophagitis: Secondary | ICD-10-CM | POA: Diagnosis not present

## 2023-07-20 DIAGNOSIS — Z992 Dependence on renal dialysis: Secondary | ICD-10-CM | POA: Diagnosis not present

## 2023-07-20 DIAGNOSIS — I132 Hypertensive heart and chronic kidney disease with heart failure and with stage 5 chronic kidney disease, or end stage renal disease: Secondary | ICD-10-CM | POA: Diagnosis not present

## 2023-07-20 DIAGNOSIS — I509 Heart failure, unspecified: Secondary | ICD-10-CM | POA: Diagnosis not present

## 2023-07-20 MED ORDER — TORSEMIDE 100 MG PO TABS: 50.0000 mg | ORAL_TABLET | Freq: Every day | ORAL | 1 refills | Status: DC

## 2023-07-20 NOTE — Telephone Encounter (Signed)
*  STAT* If patient is at the pharmacy, call can be transferred to refill team.   1. Which medications need to be refilled? (please list name of each medication and dose if known)   torsemide (DEMADEX) 100 MG tablet    2. Which pharmacy/location (including street and city if local pharmacy) is medication to be sent to? [facility] [address], [place] - [address]    3. Do they need a 30 day or 90 day supply? 90 day

## 2023-07-20 NOTE — Telephone Encounter (Signed)
Done

## 2023-07-20 NOTE — Telephone Encounter (Signed)
Pam with [facility] needs an updated list of the pt's medications sent over to the pharmacy and to her PCP. Please advise

## 2023-07-25 DIAGNOSIS — J45909 Unspecified asthma, uncomplicated: Secondary | ICD-10-CM | POA: Diagnosis not present

## 2023-07-25 DIAGNOSIS — K219 Gastro-esophageal reflux disease without esophagitis: Secondary | ICD-10-CM | POA: Diagnosis not present

## 2023-07-25 DIAGNOSIS — M109 Gout, unspecified: Secondary | ICD-10-CM | POA: Diagnosis not present

## 2023-07-25 DIAGNOSIS — D631 Anemia in chronic kidney disease: Secondary | ICD-10-CM | POA: Diagnosis not present

## 2023-07-25 DIAGNOSIS — I509 Heart failure, unspecified: Secondary | ICD-10-CM | POA: Diagnosis not present

## 2023-07-25 DIAGNOSIS — L89152 Pressure ulcer of sacral region, stage 2: Secondary | ICD-10-CM | POA: Diagnosis not present

## 2023-07-25 DIAGNOSIS — Z992 Dependence on renal dialysis: Secondary | ICD-10-CM | POA: Diagnosis not present

## 2023-07-25 DIAGNOSIS — I132 Hypertensive heart and chronic kidney disease with heart failure and with stage 5 chronic kidney disease, or end stage renal disease: Secondary | ICD-10-CM | POA: Diagnosis not present

## 2023-07-25 DIAGNOSIS — N186 End stage renal disease: Secondary | ICD-10-CM | POA: Diagnosis not present

## 2023-07-31 DIAGNOSIS — N186 End stage renal disease: Secondary | ICD-10-CM | POA: Diagnosis not present

## 2023-07-31 DIAGNOSIS — J45909 Unspecified asthma, uncomplicated: Secondary | ICD-10-CM | POA: Diagnosis not present

## 2023-07-31 DIAGNOSIS — M109 Gout, unspecified: Secondary | ICD-10-CM | POA: Diagnosis not present

## 2023-07-31 DIAGNOSIS — K219 Gastro-esophageal reflux disease without esophagitis: Secondary | ICD-10-CM | POA: Diagnosis not present

## 2023-07-31 DIAGNOSIS — I132 Hypertensive heart and chronic kidney disease with heart failure and with stage 5 chronic kidney disease, or end stage renal disease: Secondary | ICD-10-CM | POA: Diagnosis not present

## 2023-07-31 DIAGNOSIS — L89152 Pressure ulcer of sacral region, stage 2: Secondary | ICD-10-CM | POA: Diagnosis not present

## 2023-07-31 DIAGNOSIS — D631 Anemia in chronic kidney disease: Secondary | ICD-10-CM | POA: Diagnosis not present

## 2023-07-31 DIAGNOSIS — Z992 Dependence on renal dialysis: Secondary | ICD-10-CM | POA: Diagnosis not present

## 2023-07-31 DIAGNOSIS — I509 Heart failure, unspecified: Secondary | ICD-10-CM | POA: Diagnosis not present

## 2023-08-01 DIAGNOSIS — D631 Anemia in chronic kidney disease: Secondary | ICD-10-CM | POA: Diagnosis not present

## 2023-08-01 DIAGNOSIS — Z992 Dependence on renal dialysis: Secondary | ICD-10-CM | POA: Diagnosis not present

## 2023-08-01 DIAGNOSIS — I132 Hypertensive heart and chronic kidney disease with heart failure and with stage 5 chronic kidney disease, or end stage renal disease: Secondary | ICD-10-CM | POA: Diagnosis not present

## 2023-08-01 DIAGNOSIS — M109 Gout, unspecified: Secondary | ICD-10-CM | POA: Diagnosis not present

## 2023-08-01 DIAGNOSIS — N186 End stage renal disease: Secondary | ICD-10-CM | POA: Diagnosis not present

## 2023-08-01 DIAGNOSIS — J45909 Unspecified asthma, uncomplicated: Secondary | ICD-10-CM | POA: Diagnosis not present

## 2023-08-01 DIAGNOSIS — I509 Heart failure, unspecified: Secondary | ICD-10-CM | POA: Diagnosis not present

## 2023-08-01 DIAGNOSIS — K219 Gastro-esophageal reflux disease without esophagitis: Secondary | ICD-10-CM | POA: Diagnosis not present

## 2023-08-01 DIAGNOSIS — L89152 Pressure ulcer of sacral region, stage 2: Secondary | ICD-10-CM | POA: Diagnosis not present

## 2023-08-02 DIAGNOSIS — M109 Gout, unspecified: Secondary | ICD-10-CM | POA: Diagnosis not present

## 2023-08-02 DIAGNOSIS — J45909 Unspecified asthma, uncomplicated: Secondary | ICD-10-CM | POA: Diagnosis not present

## 2023-08-02 DIAGNOSIS — I132 Hypertensive heart and chronic kidney disease with heart failure and with stage 5 chronic kidney disease, or end stage renal disease: Secondary | ICD-10-CM | POA: Diagnosis not present

## 2023-08-02 DIAGNOSIS — Z992 Dependence on renal dialysis: Secondary | ICD-10-CM | POA: Diagnosis not present

## 2023-08-02 DIAGNOSIS — I509 Heart failure, unspecified: Secondary | ICD-10-CM | POA: Diagnosis not present

## 2023-08-02 DIAGNOSIS — L89152 Pressure ulcer of sacral region, stage 2: Secondary | ICD-10-CM | POA: Diagnosis not present

## 2023-08-02 DIAGNOSIS — N186 End stage renal disease: Secondary | ICD-10-CM | POA: Diagnosis not present

## 2023-08-02 DIAGNOSIS — K219 Gastro-esophageal reflux disease without esophagitis: Secondary | ICD-10-CM | POA: Diagnosis not present

## 2023-08-02 DIAGNOSIS — D631 Anemia in chronic kidney disease: Secondary | ICD-10-CM | POA: Diagnosis not present

## 2023-08-07 DIAGNOSIS — L89152 Pressure ulcer of sacral region, stage 2: Secondary | ICD-10-CM | POA: Diagnosis not present

## 2023-08-07 DIAGNOSIS — I509 Heart failure, unspecified: Secondary | ICD-10-CM | POA: Diagnosis not present

## 2023-08-07 DIAGNOSIS — I132 Hypertensive heart and chronic kidney disease with heart failure and with stage 5 chronic kidney disease, or end stage renal disease: Secondary | ICD-10-CM | POA: Diagnosis not present

## 2023-08-07 DIAGNOSIS — Z992 Dependence on renal dialysis: Secondary | ICD-10-CM | POA: Diagnosis not present

## 2023-08-07 DIAGNOSIS — N184 Chronic kidney disease, stage 4 (severe): Secondary | ICD-10-CM | POA: Diagnosis not present

## 2023-08-07 DIAGNOSIS — N186 End stage renal disease: Secondary | ICD-10-CM | POA: Diagnosis not present

## 2023-08-07 DIAGNOSIS — D631 Anemia in chronic kidney disease: Secondary | ICD-10-CM | POA: Diagnosis not present

## 2023-08-07 DIAGNOSIS — I120 Hypertensive chronic kidney disease with stage 5 chronic kidney disease or end stage renal disease: Secondary | ICD-10-CM | POA: Diagnosis not present

## 2023-08-08 DIAGNOSIS — Z992 Dependence on renal dialysis: Secondary | ICD-10-CM | POA: Diagnosis not present

## 2023-08-08 DIAGNOSIS — M109 Gout, unspecified: Secondary | ICD-10-CM | POA: Diagnosis not present

## 2023-08-08 DIAGNOSIS — K219 Gastro-esophageal reflux disease without esophagitis: Secondary | ICD-10-CM | POA: Diagnosis not present

## 2023-08-08 DIAGNOSIS — I509 Heart failure, unspecified: Secondary | ICD-10-CM | POA: Diagnosis not present

## 2023-08-08 DIAGNOSIS — I132 Hypertensive heart and chronic kidney disease with heart failure and with stage 5 chronic kidney disease, or end stage renal disease: Secondary | ICD-10-CM | POA: Diagnosis not present

## 2023-08-08 DIAGNOSIS — L89152 Pressure ulcer of sacral region, stage 2: Secondary | ICD-10-CM | POA: Diagnosis not present

## 2023-08-08 DIAGNOSIS — J45909 Unspecified asthma, uncomplicated: Secondary | ICD-10-CM | POA: Diagnosis not present

## 2023-08-08 DIAGNOSIS — D631 Anemia in chronic kidney disease: Secondary | ICD-10-CM | POA: Diagnosis not present

## 2023-08-08 DIAGNOSIS — N186 End stage renal disease: Secondary | ICD-10-CM | POA: Diagnosis not present

## 2023-08-09 DIAGNOSIS — D631 Anemia in chronic kidney disease: Secondary | ICD-10-CM | POA: Diagnosis not present

## 2023-08-09 DIAGNOSIS — J45909 Unspecified asthma, uncomplicated: Secondary | ICD-10-CM | POA: Diagnosis not present

## 2023-08-09 DIAGNOSIS — K219 Gastro-esophageal reflux disease without esophagitis: Secondary | ICD-10-CM | POA: Diagnosis not present

## 2023-08-09 DIAGNOSIS — Z992 Dependence on renal dialysis: Secondary | ICD-10-CM | POA: Diagnosis not present

## 2023-08-09 DIAGNOSIS — I132 Hypertensive heart and chronic kidney disease with heart failure and with stage 5 chronic kidney disease, or end stage renal disease: Secondary | ICD-10-CM | POA: Diagnosis not present

## 2023-08-09 DIAGNOSIS — I509 Heart failure, unspecified: Secondary | ICD-10-CM | POA: Diagnosis not present

## 2023-08-09 DIAGNOSIS — L89152 Pressure ulcer of sacral region, stage 2: Secondary | ICD-10-CM | POA: Diagnosis not present

## 2023-08-09 DIAGNOSIS — N186 End stage renal disease: Secondary | ICD-10-CM | POA: Diagnosis not present

## 2023-08-09 DIAGNOSIS — M109 Gout, unspecified: Secondary | ICD-10-CM | POA: Diagnosis not present

## 2023-08-10 DIAGNOSIS — K219 Gastro-esophageal reflux disease without esophagitis: Secondary | ICD-10-CM | POA: Diagnosis not present

## 2023-08-10 DIAGNOSIS — Z992 Dependence on renal dialysis: Secondary | ICD-10-CM | POA: Diagnosis not present

## 2023-08-10 DIAGNOSIS — I509 Heart failure, unspecified: Secondary | ICD-10-CM | POA: Diagnosis not present

## 2023-08-10 DIAGNOSIS — M109 Gout, unspecified: Secondary | ICD-10-CM | POA: Diagnosis not present

## 2023-08-10 DIAGNOSIS — N186 End stage renal disease: Secondary | ICD-10-CM | POA: Diagnosis not present

## 2023-08-10 DIAGNOSIS — I132 Hypertensive heart and chronic kidney disease with heart failure and with stage 5 chronic kidney disease, or end stage renal disease: Secondary | ICD-10-CM | POA: Diagnosis not present

## 2023-08-10 DIAGNOSIS — J45909 Unspecified asthma, uncomplicated: Secondary | ICD-10-CM | POA: Diagnosis not present

## 2023-08-10 DIAGNOSIS — L89152 Pressure ulcer of sacral region, stage 2: Secondary | ICD-10-CM | POA: Diagnosis not present

## 2023-08-10 DIAGNOSIS — D631 Anemia in chronic kidney disease: Secondary | ICD-10-CM | POA: Diagnosis not present

## 2023-08-13 DIAGNOSIS — M109 Gout, unspecified: Secondary | ICD-10-CM | POA: Diagnosis not present

## 2023-08-13 DIAGNOSIS — I509 Heart failure, unspecified: Secondary | ICD-10-CM | POA: Diagnosis not present

## 2023-08-13 DIAGNOSIS — K219 Gastro-esophageal reflux disease without esophagitis: Secondary | ICD-10-CM | POA: Diagnosis not present

## 2023-08-13 DIAGNOSIS — L89152 Pressure ulcer of sacral region, stage 2: Secondary | ICD-10-CM | POA: Diagnosis not present

## 2023-08-13 DIAGNOSIS — I132 Hypertensive heart and chronic kidney disease with heart failure and with stage 5 chronic kidney disease, or end stage renal disease: Secondary | ICD-10-CM | POA: Diagnosis not present

## 2023-08-13 DIAGNOSIS — D631 Anemia in chronic kidney disease: Secondary | ICD-10-CM | POA: Diagnosis not present

## 2023-08-13 DIAGNOSIS — J45909 Unspecified asthma, uncomplicated: Secondary | ICD-10-CM | POA: Diagnosis not present

## 2023-08-13 DIAGNOSIS — Z992 Dependence on renal dialysis: Secondary | ICD-10-CM | POA: Diagnosis not present

## 2023-08-13 DIAGNOSIS — N186 End stage renal disease: Secondary | ICD-10-CM | POA: Diagnosis not present

## 2023-08-14 DIAGNOSIS — Z992 Dependence on renal dialysis: Secondary | ICD-10-CM | POA: Diagnosis not present

## 2023-08-14 DIAGNOSIS — N186 End stage renal disease: Secondary | ICD-10-CM | POA: Diagnosis not present

## 2023-08-14 DIAGNOSIS — Z23 Encounter for immunization: Secondary | ICD-10-CM | POA: Diagnosis not present

## 2023-08-15 DIAGNOSIS — J45909 Unspecified asthma, uncomplicated: Secondary | ICD-10-CM | POA: Diagnosis not present

## 2023-08-15 DIAGNOSIS — D631 Anemia in chronic kidney disease: Secondary | ICD-10-CM | POA: Diagnosis not present

## 2023-08-15 DIAGNOSIS — M109 Gout, unspecified: Secondary | ICD-10-CM | POA: Diagnosis not present

## 2023-08-15 DIAGNOSIS — I509 Heart failure, unspecified: Secondary | ICD-10-CM | POA: Diagnosis not present

## 2023-08-15 DIAGNOSIS — Z992 Dependence on renal dialysis: Secondary | ICD-10-CM | POA: Diagnosis not present

## 2023-08-15 DIAGNOSIS — L89152 Pressure ulcer of sacral region, stage 2: Secondary | ICD-10-CM | POA: Diagnosis not present

## 2023-08-15 DIAGNOSIS — N186 End stage renal disease: Secondary | ICD-10-CM | POA: Diagnosis not present

## 2023-08-15 DIAGNOSIS — K219 Gastro-esophageal reflux disease without esophagitis: Secondary | ICD-10-CM | POA: Diagnosis not present

## 2023-08-15 DIAGNOSIS — I132 Hypertensive heart and chronic kidney disease with heart failure and with stage 5 chronic kidney disease, or end stage renal disease: Secondary | ICD-10-CM | POA: Diagnosis not present

## 2023-08-16 DIAGNOSIS — N186 End stage renal disease: Secondary | ICD-10-CM | POA: Diagnosis not present

## 2023-08-16 DIAGNOSIS — Z23 Encounter for immunization: Secondary | ICD-10-CM | POA: Diagnosis not present

## 2023-08-16 DIAGNOSIS — Z992 Dependence on renal dialysis: Secondary | ICD-10-CM | POA: Diagnosis not present

## 2023-08-17 DIAGNOSIS — J45909 Unspecified asthma, uncomplicated: Secondary | ICD-10-CM | POA: Diagnosis not present

## 2023-08-17 DIAGNOSIS — Z992 Dependence on renal dialysis: Secondary | ICD-10-CM | POA: Diagnosis not present

## 2023-08-17 DIAGNOSIS — I132 Hypertensive heart and chronic kidney disease with heart failure and with stage 5 chronic kidney disease, or end stage renal disease: Secondary | ICD-10-CM | POA: Diagnosis not present

## 2023-08-17 DIAGNOSIS — K219 Gastro-esophageal reflux disease without esophagitis: Secondary | ICD-10-CM | POA: Diagnosis not present

## 2023-08-17 DIAGNOSIS — I509 Heart failure, unspecified: Secondary | ICD-10-CM | POA: Diagnosis not present

## 2023-08-17 DIAGNOSIS — L89152 Pressure ulcer of sacral region, stage 2: Secondary | ICD-10-CM | POA: Diagnosis not present

## 2023-08-17 DIAGNOSIS — M109 Gout, unspecified: Secondary | ICD-10-CM | POA: Diagnosis not present

## 2023-08-17 DIAGNOSIS — D631 Anemia in chronic kidney disease: Secondary | ICD-10-CM | POA: Diagnosis not present

## 2023-08-17 DIAGNOSIS — N186 End stage renal disease: Secondary | ICD-10-CM | POA: Diagnosis not present

## 2023-08-18 DIAGNOSIS — N186 End stage renal disease: Secondary | ICD-10-CM | POA: Diagnosis not present

## 2023-08-18 DIAGNOSIS — Z23 Encounter for immunization: Secondary | ICD-10-CM | POA: Diagnosis not present

## 2023-08-18 DIAGNOSIS — Z992 Dependence on renal dialysis: Secondary | ICD-10-CM | POA: Diagnosis not present

## 2023-08-19 DIAGNOSIS — L89152 Pressure ulcer of sacral region, stage 2: Secondary | ICD-10-CM | POA: Diagnosis not present

## 2023-08-19 DIAGNOSIS — I132 Hypertensive heart and chronic kidney disease with heart failure and with stage 5 chronic kidney disease, or end stage renal disease: Secondary | ICD-10-CM | POA: Diagnosis not present

## 2023-08-19 DIAGNOSIS — M109 Gout, unspecified: Secondary | ICD-10-CM | POA: Diagnosis not present

## 2023-08-19 DIAGNOSIS — K219 Gastro-esophageal reflux disease without esophagitis: Secondary | ICD-10-CM | POA: Diagnosis not present

## 2023-08-19 DIAGNOSIS — I509 Heart failure, unspecified: Secondary | ICD-10-CM | POA: Diagnosis not present

## 2023-08-19 DIAGNOSIS — Z992 Dependence on renal dialysis: Secondary | ICD-10-CM | POA: Diagnosis not present

## 2023-08-19 DIAGNOSIS — D631 Anemia in chronic kidney disease: Secondary | ICD-10-CM | POA: Diagnosis not present

## 2023-08-19 DIAGNOSIS — N186 End stage renal disease: Secondary | ICD-10-CM | POA: Diagnosis not present

## 2023-08-19 DIAGNOSIS — J45909 Unspecified asthma, uncomplicated: Secondary | ICD-10-CM | POA: Diagnosis not present

## 2023-08-20 DIAGNOSIS — N186 End stage renal disease: Secondary | ICD-10-CM | POA: Diagnosis not present

## 2023-08-20 DIAGNOSIS — Z992 Dependence on renal dialysis: Secondary | ICD-10-CM | POA: Diagnosis not present

## 2023-08-20 DIAGNOSIS — Z23 Encounter for immunization: Secondary | ICD-10-CM | POA: Diagnosis not present

## 2023-08-24 DIAGNOSIS — Z992 Dependence on renal dialysis: Secondary | ICD-10-CM | POA: Diagnosis not present

## 2023-08-24 DIAGNOSIS — M109 Gout, unspecified: Secondary | ICD-10-CM | POA: Diagnosis not present

## 2023-08-24 DIAGNOSIS — L89152 Pressure ulcer of sacral region, stage 2: Secondary | ICD-10-CM | POA: Diagnosis not present

## 2023-08-24 DIAGNOSIS — D631 Anemia in chronic kidney disease: Secondary | ICD-10-CM | POA: Diagnosis not present

## 2023-08-24 DIAGNOSIS — I509 Heart failure, unspecified: Secondary | ICD-10-CM | POA: Diagnosis not present

## 2023-08-24 DIAGNOSIS — K219 Gastro-esophageal reflux disease without esophagitis: Secondary | ICD-10-CM | POA: Diagnosis not present

## 2023-08-24 DIAGNOSIS — N186 End stage renal disease: Secondary | ICD-10-CM | POA: Diagnosis not present

## 2023-08-24 DIAGNOSIS — I132 Hypertensive heart and chronic kidney disease with heart failure and with stage 5 chronic kidney disease, or end stage renal disease: Secondary | ICD-10-CM | POA: Diagnosis not present

## 2023-08-24 DIAGNOSIS — J45909 Unspecified asthma, uncomplicated: Secondary | ICD-10-CM | POA: Diagnosis not present

## 2023-08-25 DIAGNOSIS — Z992 Dependence on renal dialysis: Secondary | ICD-10-CM | POA: Diagnosis not present

## 2023-08-25 DIAGNOSIS — Z23 Encounter for immunization: Secondary | ICD-10-CM | POA: Diagnosis not present

## 2023-08-25 DIAGNOSIS — N186 End stage renal disease: Secondary | ICD-10-CM | POA: Diagnosis not present

## 2023-08-28 DIAGNOSIS — N186 End stage renal disease: Secondary | ICD-10-CM | POA: Diagnosis not present

## 2023-08-28 DIAGNOSIS — Z992 Dependence on renal dialysis: Secondary | ICD-10-CM | POA: Diagnosis not present

## 2023-08-28 DIAGNOSIS — Z23 Encounter for immunization: Secondary | ICD-10-CM | POA: Diagnosis not present

## 2023-08-30 DIAGNOSIS — N186 End stage renal disease: Secondary | ICD-10-CM | POA: Diagnosis not present

## 2023-08-30 DIAGNOSIS — Z23 Encounter for immunization: Secondary | ICD-10-CM | POA: Diagnosis not present

## 2023-08-30 DIAGNOSIS — Z992 Dependence on renal dialysis: Secondary | ICD-10-CM | POA: Diagnosis not present

## 2023-08-31 DIAGNOSIS — I509 Heart failure, unspecified: Secondary | ICD-10-CM | POA: Diagnosis not present

## 2023-08-31 DIAGNOSIS — L89152 Pressure ulcer of sacral region, stage 2: Secondary | ICD-10-CM | POA: Diagnosis not present

## 2023-08-31 DIAGNOSIS — J45909 Unspecified asthma, uncomplicated: Secondary | ICD-10-CM | POA: Diagnosis not present

## 2023-08-31 DIAGNOSIS — D631 Anemia in chronic kidney disease: Secondary | ICD-10-CM | POA: Diagnosis not present

## 2023-08-31 DIAGNOSIS — I132 Hypertensive heart and chronic kidney disease with heart failure and with stage 5 chronic kidney disease, or end stage renal disease: Secondary | ICD-10-CM | POA: Diagnosis not present

## 2023-08-31 DIAGNOSIS — Z992 Dependence on renal dialysis: Secondary | ICD-10-CM | POA: Diagnosis not present

## 2023-08-31 DIAGNOSIS — K219 Gastro-esophageal reflux disease without esophagitis: Secondary | ICD-10-CM | POA: Diagnosis not present

## 2023-08-31 DIAGNOSIS — N186 End stage renal disease: Secondary | ICD-10-CM | POA: Diagnosis not present

## 2023-08-31 DIAGNOSIS — M109 Gout, unspecified: Secondary | ICD-10-CM | POA: Diagnosis not present

## 2023-09-01 DIAGNOSIS — Z23 Encounter for immunization: Secondary | ICD-10-CM | POA: Diagnosis not present

## 2023-09-01 DIAGNOSIS — Z992 Dependence on renal dialysis: Secondary | ICD-10-CM | POA: Diagnosis not present

## 2023-09-01 DIAGNOSIS — N186 End stage renal disease: Secondary | ICD-10-CM | POA: Diagnosis not present

## 2023-09-02 DIAGNOSIS — L89152 Pressure ulcer of sacral region, stage 2: Secondary | ICD-10-CM | POA: Diagnosis not present

## 2023-09-02 DIAGNOSIS — I132 Hypertensive heart and chronic kidney disease with heart failure and with stage 5 chronic kidney disease, or end stage renal disease: Secondary | ICD-10-CM | POA: Diagnosis not present

## 2023-09-02 DIAGNOSIS — J45909 Unspecified asthma, uncomplicated: Secondary | ICD-10-CM | POA: Diagnosis not present

## 2023-09-02 DIAGNOSIS — I509 Heart failure, unspecified: Secondary | ICD-10-CM | POA: Diagnosis not present

## 2023-09-02 DIAGNOSIS — D631 Anemia in chronic kidney disease: Secondary | ICD-10-CM | POA: Diagnosis not present

## 2023-09-02 DIAGNOSIS — Z992 Dependence on renal dialysis: Secondary | ICD-10-CM | POA: Diagnosis not present

## 2023-09-02 DIAGNOSIS — M109 Gout, unspecified: Secondary | ICD-10-CM | POA: Diagnosis not present

## 2023-09-02 DIAGNOSIS — K219 Gastro-esophageal reflux disease without esophagitis: Secondary | ICD-10-CM | POA: Diagnosis not present

## 2023-09-02 DIAGNOSIS — N186 End stage renal disease: Secondary | ICD-10-CM | POA: Diagnosis not present

## 2023-09-04 DIAGNOSIS — N186 End stage renal disease: Secondary | ICD-10-CM | POA: Diagnosis not present

## 2023-09-04 DIAGNOSIS — Z23 Encounter for immunization: Secondary | ICD-10-CM | POA: Diagnosis not present

## 2023-09-04 DIAGNOSIS — Z992 Dependence on renal dialysis: Secondary | ICD-10-CM | POA: Diagnosis not present

## 2023-09-05 DIAGNOSIS — L89152 Pressure ulcer of sacral region, stage 2: Secondary | ICD-10-CM | POA: Diagnosis not present

## 2023-09-05 DIAGNOSIS — I132 Hypertensive heart and chronic kidney disease with heart failure and with stage 5 chronic kidney disease, or end stage renal disease: Secondary | ICD-10-CM | POA: Diagnosis not present

## 2023-09-05 DIAGNOSIS — K219 Gastro-esophageal reflux disease without esophagitis: Secondary | ICD-10-CM | POA: Diagnosis not present

## 2023-09-05 DIAGNOSIS — D631 Anemia in chronic kidney disease: Secondary | ICD-10-CM | POA: Diagnosis not present

## 2023-09-05 DIAGNOSIS — I509 Heart failure, unspecified: Secondary | ICD-10-CM | POA: Diagnosis not present

## 2023-09-05 DIAGNOSIS — N186 End stage renal disease: Secondary | ICD-10-CM | POA: Diagnosis not present

## 2023-09-05 DIAGNOSIS — M109 Gout, unspecified: Secondary | ICD-10-CM | POA: Diagnosis not present

## 2023-09-05 DIAGNOSIS — J45909 Unspecified asthma, uncomplicated: Secondary | ICD-10-CM | POA: Diagnosis not present

## 2023-09-05 DIAGNOSIS — Z992 Dependence on renal dialysis: Secondary | ICD-10-CM | POA: Diagnosis not present

## 2023-09-06 DIAGNOSIS — Z992 Dependence on renal dialysis: Secondary | ICD-10-CM | POA: Diagnosis not present

## 2023-09-06 DIAGNOSIS — Z23 Encounter for immunization: Secondary | ICD-10-CM | POA: Diagnosis not present

## 2023-09-06 DIAGNOSIS — N186 End stage renal disease: Secondary | ICD-10-CM | POA: Diagnosis not present

## 2023-09-10 DIAGNOSIS — Z992 Dependence on renal dialysis: Secondary | ICD-10-CM | POA: Diagnosis not present

## 2023-09-10 DIAGNOSIS — Z23 Encounter for immunization: Secondary | ICD-10-CM | POA: Diagnosis not present

## 2023-09-10 DIAGNOSIS — N186 End stage renal disease: Secondary | ICD-10-CM | POA: Diagnosis not present

## 2023-09-13 DIAGNOSIS — Z23 Encounter for immunization: Secondary | ICD-10-CM | POA: Diagnosis not present

## 2023-09-13 DIAGNOSIS — N186 End stage renal disease: Secondary | ICD-10-CM | POA: Diagnosis not present

## 2023-09-13 DIAGNOSIS — Z992 Dependence on renal dialysis: Secondary | ICD-10-CM | POA: Diagnosis not present

## 2023-09-14 DIAGNOSIS — J45909 Unspecified asthma, uncomplicated: Secondary | ICD-10-CM | POA: Diagnosis not present

## 2023-09-14 DIAGNOSIS — K219 Gastro-esophageal reflux disease without esophagitis: Secondary | ICD-10-CM | POA: Diagnosis not present

## 2023-09-14 DIAGNOSIS — M109 Gout, unspecified: Secondary | ICD-10-CM | POA: Diagnosis not present

## 2023-09-14 DIAGNOSIS — I132 Hypertensive heart and chronic kidney disease with heart failure and with stage 5 chronic kidney disease, or end stage renal disease: Secondary | ICD-10-CM | POA: Diagnosis not present

## 2023-09-14 DIAGNOSIS — Z992 Dependence on renal dialysis: Secondary | ICD-10-CM | POA: Diagnosis not present

## 2023-09-14 DIAGNOSIS — L89152 Pressure ulcer of sacral region, stage 2: Secondary | ICD-10-CM | POA: Diagnosis not present

## 2023-09-14 DIAGNOSIS — I509 Heart failure, unspecified: Secondary | ICD-10-CM | POA: Diagnosis not present

## 2023-09-14 DIAGNOSIS — D631 Anemia in chronic kidney disease: Secondary | ICD-10-CM | POA: Diagnosis not present

## 2023-09-14 DIAGNOSIS — N186 End stage renal disease: Secondary | ICD-10-CM | POA: Diagnosis not present

## 2023-09-15 DIAGNOSIS — Z992 Dependence on renal dialysis: Secondary | ICD-10-CM | POA: Diagnosis not present

## 2023-09-15 DIAGNOSIS — N186 End stage renal disease: Secondary | ICD-10-CM | POA: Diagnosis not present

## 2023-09-15 DIAGNOSIS — Z23 Encounter for immunization: Secondary | ICD-10-CM | POA: Diagnosis not present

## 2023-09-18 DIAGNOSIS — Z23 Encounter for immunization: Secondary | ICD-10-CM | POA: Diagnosis not present

## 2023-09-18 DIAGNOSIS — N186 End stage renal disease: Secondary | ICD-10-CM | POA: Diagnosis not present

## 2023-09-18 DIAGNOSIS — Z992 Dependence on renal dialysis: Secondary | ICD-10-CM | POA: Diagnosis not present

## 2023-09-20 DIAGNOSIS — N186 End stage renal disease: Secondary | ICD-10-CM | POA: Diagnosis not present

## 2023-09-20 DIAGNOSIS — Z992 Dependence on renal dialysis: Secondary | ICD-10-CM | POA: Diagnosis not present

## 2023-09-20 DIAGNOSIS — Z23 Encounter for immunization: Secondary | ICD-10-CM | POA: Diagnosis not present

## 2023-09-22 DIAGNOSIS — Z992 Dependence on renal dialysis: Secondary | ICD-10-CM | POA: Diagnosis not present

## 2023-09-22 DIAGNOSIS — Z23 Encounter for immunization: Secondary | ICD-10-CM | POA: Diagnosis not present

## 2023-09-22 DIAGNOSIS — N186 End stage renal disease: Secondary | ICD-10-CM | POA: Diagnosis not present

## 2023-09-25 DIAGNOSIS — N186 End stage renal disease: Secondary | ICD-10-CM | POA: Diagnosis not present

## 2023-09-25 DIAGNOSIS — Z23 Encounter for immunization: Secondary | ICD-10-CM | POA: Diagnosis not present

## 2023-09-25 DIAGNOSIS — Z992 Dependence on renal dialysis: Secondary | ICD-10-CM | POA: Diagnosis not present

## 2023-09-27 DIAGNOSIS — Z23 Encounter for immunization: Secondary | ICD-10-CM | POA: Diagnosis not present

## 2023-09-27 DIAGNOSIS — Z992 Dependence on renal dialysis: Secondary | ICD-10-CM | POA: Diagnosis not present

## 2023-09-27 DIAGNOSIS — N186 End stage renal disease: Secondary | ICD-10-CM | POA: Diagnosis not present

## 2023-09-28 DIAGNOSIS — E213 Hyperparathyroidism, unspecified: Secondary | ICD-10-CM | POA: Diagnosis not present

## 2023-09-28 DIAGNOSIS — J45909 Unspecified asthma, uncomplicated: Secondary | ICD-10-CM | POA: Diagnosis not present

## 2023-09-28 DIAGNOSIS — E782 Mixed hyperlipidemia: Secondary | ICD-10-CM | POA: Diagnosis not present

## 2023-09-28 DIAGNOSIS — Z992 Dependence on renal dialysis: Secondary | ICD-10-CM | POA: Diagnosis not present

## 2023-09-28 DIAGNOSIS — N186 End stage renal disease: Secondary | ICD-10-CM | POA: Diagnosis not present

## 2023-09-28 DIAGNOSIS — K219 Gastro-esophageal reflux disease without esophagitis: Secondary | ICD-10-CM | POA: Diagnosis not present

## 2023-09-29 DIAGNOSIS — Z992 Dependence on renal dialysis: Secondary | ICD-10-CM | POA: Diagnosis not present

## 2023-09-29 DIAGNOSIS — N186 End stage renal disease: Secondary | ICD-10-CM | POA: Diagnosis not present

## 2023-09-29 DIAGNOSIS — Z23 Encounter for immunization: Secondary | ICD-10-CM | POA: Diagnosis not present

## 2023-10-02 DIAGNOSIS — Z992 Dependence on renal dialysis: Secondary | ICD-10-CM | POA: Diagnosis not present

## 2023-10-02 DIAGNOSIS — N186 End stage renal disease: Secondary | ICD-10-CM | POA: Diagnosis not present

## 2023-10-02 DIAGNOSIS — Z23 Encounter for immunization: Secondary | ICD-10-CM | POA: Diagnosis not present

## 2023-10-04 DIAGNOSIS — Z23 Encounter for immunization: Secondary | ICD-10-CM | POA: Diagnosis not present

## 2023-10-04 DIAGNOSIS — Z992 Dependence on renal dialysis: Secondary | ICD-10-CM | POA: Diagnosis not present

## 2023-10-04 DIAGNOSIS — N186 End stage renal disease: Secondary | ICD-10-CM | POA: Diagnosis not present

## 2023-10-06 DIAGNOSIS — N186 End stage renal disease: Secondary | ICD-10-CM | POA: Diagnosis not present

## 2023-10-06 DIAGNOSIS — Z992 Dependence on renal dialysis: Secondary | ICD-10-CM | POA: Diagnosis not present

## 2023-10-06 DIAGNOSIS — Z23 Encounter for immunization: Secondary | ICD-10-CM | POA: Diagnosis not present

## 2023-10-09 DIAGNOSIS — Z992 Dependence on renal dialysis: Secondary | ICD-10-CM | POA: Diagnosis not present

## 2023-10-09 DIAGNOSIS — N186 End stage renal disease: Secondary | ICD-10-CM | POA: Diagnosis not present

## 2023-10-09 DIAGNOSIS — Z23 Encounter for immunization: Secondary | ICD-10-CM | POA: Diagnosis not present

## 2023-10-11 DIAGNOSIS — Z992 Dependence on renal dialysis: Secondary | ICD-10-CM | POA: Diagnosis not present

## 2023-10-11 DIAGNOSIS — N186 End stage renal disease: Secondary | ICD-10-CM | POA: Diagnosis not present

## 2023-10-11 DIAGNOSIS — Z23 Encounter for immunization: Secondary | ICD-10-CM | POA: Diagnosis not present

## 2023-10-13 DIAGNOSIS — Z992 Dependence on renal dialysis: Secondary | ICD-10-CM | POA: Diagnosis not present

## 2023-10-13 DIAGNOSIS — N186 End stage renal disease: Secondary | ICD-10-CM | POA: Diagnosis not present

## 2023-10-13 DIAGNOSIS — Z23 Encounter for immunization: Secondary | ICD-10-CM | POA: Diagnosis not present

## 2023-10-16 DIAGNOSIS — Z992 Dependence on renal dialysis: Secondary | ICD-10-CM | POA: Diagnosis not present

## 2023-10-16 DIAGNOSIS — N186 End stage renal disease: Secondary | ICD-10-CM | POA: Diagnosis not present

## 2023-10-16 DIAGNOSIS — Z23 Encounter for immunization: Secondary | ICD-10-CM | POA: Diagnosis not present

## 2023-10-18 DIAGNOSIS — Z23 Encounter for immunization: Secondary | ICD-10-CM | POA: Diagnosis not present

## 2023-10-18 DIAGNOSIS — N186 End stage renal disease: Secondary | ICD-10-CM | POA: Diagnosis not present

## 2023-10-18 DIAGNOSIS — Z992 Dependence on renal dialysis: Secondary | ICD-10-CM | POA: Diagnosis not present

## 2023-10-20 DIAGNOSIS — N186 End stage renal disease: Secondary | ICD-10-CM | POA: Diagnosis not present

## 2023-10-20 DIAGNOSIS — Z23 Encounter for immunization: Secondary | ICD-10-CM | POA: Diagnosis not present

## 2023-10-20 DIAGNOSIS — Z992 Dependence on renal dialysis: Secondary | ICD-10-CM | POA: Diagnosis not present

## 2023-10-22 ENCOUNTER — Telehealth: Payer: Self-pay | Admitting: Cardiology

## 2023-10-22 NOTE — Telephone Encounter (Signed)
 Pts son came in today stating that the nurse that takes care of his mother had an RX filled and Dr. Diona Browner had approved it. He is stating that the Pt has run out of refills for that med and is wondering if the pt needs to continue the med. If she needs to continue do they need to f/u w/Dr. Diona Browner before getting that refill. Pts son is not sure of which med and will bring the empty bottle in tomorrow. Please get updated DPR from pt.

## 2023-10-23 DIAGNOSIS — Z992 Dependence on renal dialysis: Secondary | ICD-10-CM | POA: Diagnosis not present

## 2023-10-23 DIAGNOSIS — N186 End stage renal disease: Secondary | ICD-10-CM | POA: Diagnosis not present

## 2023-10-23 DIAGNOSIS — Z23 Encounter for immunization: Secondary | ICD-10-CM | POA: Diagnosis not present

## 2023-10-23 MED ORDER — TORSEMIDE 100 MG PO TABS: 50.0000 mg | ORAL_TABLET | Freq: Every day | ORAL | 0 refills | Status: DC

## 2023-10-23 NOTE — Telephone Encounter (Signed)
 Torsemide refill sent to [facility]. Due to see Diona Browner in April 2025. Sent to schedulers to contact patient.

## 2023-10-23 NOTE — Telephone Encounter (Signed)
 1. Which medications need to be refilled? (please list name of each medication and dose if known) Torsemide 100mg   2. Which pharmacy/location (including street and city if local pharmacy) is medication to be sent to? [facility]  3. Do they need a 30 day or 90 day supply? 90 day supply

## 2023-10-25 DIAGNOSIS — Z992 Dependence on renal dialysis: Secondary | ICD-10-CM | POA: Diagnosis not present

## 2023-10-25 DIAGNOSIS — N186 End stage renal disease: Secondary | ICD-10-CM | POA: Diagnosis not present

## 2023-10-25 DIAGNOSIS — Z23 Encounter for immunization: Secondary | ICD-10-CM | POA: Diagnosis not present

## 2023-10-26 DIAGNOSIS — E213 Hyperparathyroidism, unspecified: Secondary | ICD-10-CM | POA: Diagnosis not present

## 2023-10-26 DIAGNOSIS — E782 Mixed hyperlipidemia: Secondary | ICD-10-CM | POA: Diagnosis not present

## 2023-10-26 DIAGNOSIS — J45909 Unspecified asthma, uncomplicated: Secondary | ICD-10-CM | POA: Diagnosis not present

## 2023-10-26 DIAGNOSIS — N186 End stage renal disease: Secondary | ICD-10-CM | POA: Diagnosis not present

## 2023-10-26 DIAGNOSIS — Z992 Dependence on renal dialysis: Secondary | ICD-10-CM | POA: Diagnosis not present

## 2023-10-26 DIAGNOSIS — K219 Gastro-esophageal reflux disease without esophagitis: Secondary | ICD-10-CM | POA: Diagnosis not present

## 2023-10-27 DIAGNOSIS — N186 End stage renal disease: Secondary | ICD-10-CM | POA: Diagnosis not present

## 2023-10-27 DIAGNOSIS — Z992 Dependence on renal dialysis: Secondary | ICD-10-CM | POA: Diagnosis not present

## 2023-10-30 DIAGNOSIS — Z992 Dependence on renal dialysis: Secondary | ICD-10-CM | POA: Diagnosis not present

## 2023-10-30 DIAGNOSIS — N186 End stage renal disease: Secondary | ICD-10-CM | POA: Diagnosis not present

## 2023-11-01 DIAGNOSIS — Z992 Dependence on renal dialysis: Secondary | ICD-10-CM | POA: Diagnosis not present

## 2023-11-01 DIAGNOSIS — N186 End stage renal disease: Secondary | ICD-10-CM | POA: Diagnosis not present

## 2023-11-03 DIAGNOSIS — Z992 Dependence on renal dialysis: Secondary | ICD-10-CM | POA: Diagnosis not present

## 2023-11-03 DIAGNOSIS — N186 End stage renal disease: Secondary | ICD-10-CM | POA: Diagnosis not present

## 2023-11-06 DIAGNOSIS — N186 End stage renal disease: Secondary | ICD-10-CM | POA: Diagnosis not present

## 2023-11-06 DIAGNOSIS — Z992 Dependence on renal dialysis: Secondary | ICD-10-CM | POA: Diagnosis not present

## 2023-11-08 DIAGNOSIS — N186 End stage renal disease: Secondary | ICD-10-CM | POA: Diagnosis not present

## 2023-11-08 DIAGNOSIS — Z992 Dependence on renal dialysis: Secondary | ICD-10-CM | POA: Diagnosis not present

## 2023-11-10 DIAGNOSIS — Z992 Dependence on renal dialysis: Secondary | ICD-10-CM | POA: Diagnosis not present

## 2023-11-10 DIAGNOSIS — N186 End stage renal disease: Secondary | ICD-10-CM | POA: Diagnosis not present

## 2023-11-13 DIAGNOSIS — N186 End stage renal disease: Secondary | ICD-10-CM | POA: Diagnosis not present

## 2023-11-13 DIAGNOSIS — Z992 Dependence on renal dialysis: Secondary | ICD-10-CM | POA: Diagnosis not present

## 2023-11-17 DIAGNOSIS — Z992 Dependence on renal dialysis: Secondary | ICD-10-CM | POA: Diagnosis not present

## 2023-11-17 DIAGNOSIS — N186 End stage renal disease: Secondary | ICD-10-CM | POA: Diagnosis not present

## 2023-11-20 DIAGNOSIS — Z992 Dependence on renal dialysis: Secondary | ICD-10-CM | POA: Diagnosis not present

## 2023-11-20 DIAGNOSIS — Z13220 Encounter for screening for lipoid disorders: Secondary | ICD-10-CM | POA: Diagnosis not present

## 2023-11-20 DIAGNOSIS — N186 End stage renal disease: Secondary | ICD-10-CM | POA: Diagnosis not present

## 2023-11-22 DIAGNOSIS — N186 End stage renal disease: Secondary | ICD-10-CM | POA: Diagnosis not present

## 2023-11-22 DIAGNOSIS — Z992 Dependence on renal dialysis: Secondary | ICD-10-CM | POA: Diagnosis not present

## 2023-11-24 DIAGNOSIS — Z992 Dependence on renal dialysis: Secondary | ICD-10-CM | POA: Diagnosis not present

## 2023-11-24 DIAGNOSIS — N186 End stage renal disease: Secondary | ICD-10-CM | POA: Diagnosis not present

## 2023-11-26 DIAGNOSIS — J45909 Unspecified asthma, uncomplicated: Secondary | ICD-10-CM | POA: Diagnosis not present

## 2023-11-26 DIAGNOSIS — E782 Mixed hyperlipidemia: Secondary | ICD-10-CM | POA: Diagnosis not present

## 2023-11-26 DIAGNOSIS — Z992 Dependence on renal dialysis: Secondary | ICD-10-CM | POA: Diagnosis not present

## 2023-11-26 DIAGNOSIS — K219 Gastro-esophageal reflux disease without esophagitis: Secondary | ICD-10-CM | POA: Diagnosis not present

## 2023-11-26 DIAGNOSIS — N186 End stage renal disease: Secondary | ICD-10-CM | POA: Diagnosis not present

## 2023-11-26 DIAGNOSIS — E213 Hyperparathyroidism, unspecified: Secondary | ICD-10-CM | POA: Diagnosis not present

## 2023-11-27 DIAGNOSIS — N186 End stage renal disease: Secondary | ICD-10-CM | POA: Diagnosis not present

## 2023-11-27 DIAGNOSIS — Z992 Dependence on renal dialysis: Secondary | ICD-10-CM | POA: Diagnosis not present

## 2023-11-29 DIAGNOSIS — Z992 Dependence on renal dialysis: Secondary | ICD-10-CM | POA: Diagnosis not present

## 2023-11-29 DIAGNOSIS — N186 End stage renal disease: Secondary | ICD-10-CM | POA: Diagnosis not present

## 2023-12-01 DIAGNOSIS — Z992 Dependence on renal dialysis: Secondary | ICD-10-CM | POA: Diagnosis not present

## 2023-12-01 DIAGNOSIS — N186 End stage renal disease: Secondary | ICD-10-CM | POA: Diagnosis not present

## 2023-12-04 DIAGNOSIS — N186 End stage renal disease: Secondary | ICD-10-CM | POA: Diagnosis not present

## 2023-12-04 DIAGNOSIS — Z992 Dependence on renal dialysis: Secondary | ICD-10-CM | POA: Diagnosis not present

## 2023-12-06 DIAGNOSIS — N186 End stage renal disease: Secondary | ICD-10-CM | POA: Diagnosis not present

## 2023-12-06 DIAGNOSIS — Z992 Dependence on renal dialysis: Secondary | ICD-10-CM | POA: Diagnosis not present

## 2023-12-08 DIAGNOSIS — Z992 Dependence on renal dialysis: Secondary | ICD-10-CM | POA: Diagnosis not present

## 2023-12-08 DIAGNOSIS — N186 End stage renal disease: Secondary | ICD-10-CM | POA: Diagnosis not present

## 2023-12-11 DIAGNOSIS — N186 End stage renal disease: Secondary | ICD-10-CM | POA: Diagnosis not present

## 2023-12-11 DIAGNOSIS — Z992 Dependence on renal dialysis: Secondary | ICD-10-CM | POA: Diagnosis not present

## 2023-12-13 DIAGNOSIS — Z992 Dependence on renal dialysis: Secondary | ICD-10-CM | POA: Diagnosis not present

## 2023-12-13 DIAGNOSIS — N186 End stage renal disease: Secondary | ICD-10-CM | POA: Diagnosis not present

## 2023-12-15 DIAGNOSIS — N186 End stage renal disease: Secondary | ICD-10-CM | POA: Diagnosis not present

## 2023-12-15 DIAGNOSIS — Z992 Dependence on renal dialysis: Secondary | ICD-10-CM | POA: Diagnosis not present

## 2023-12-17 DIAGNOSIS — Z681 Body mass index (BMI) 19 or less, adult: Secondary | ICD-10-CM | POA: Diagnosis not present

## 2023-12-17 DIAGNOSIS — I10 Essential (primary) hypertension: Secondary | ICD-10-CM | POA: Diagnosis not present

## 2023-12-17 DIAGNOSIS — R509 Fever, unspecified: Secondary | ICD-10-CM | POA: Diagnosis not present

## 2023-12-17 DIAGNOSIS — J189 Pneumonia, unspecified organism: Secondary | ICD-10-CM | POA: Diagnosis not present

## 2023-12-17 DIAGNOSIS — Z20828 Contact with and (suspected) exposure to other viral communicable diseases: Secondary | ICD-10-CM | POA: Diagnosis not present

## 2023-12-17 DIAGNOSIS — I5032 Chronic diastolic (congestive) heart failure: Secondary | ICD-10-CM | POA: Diagnosis not present

## 2023-12-20 DIAGNOSIS — N186 End stage renal disease: Secondary | ICD-10-CM | POA: Diagnosis not present

## 2023-12-20 DIAGNOSIS — Z992 Dependence on renal dialysis: Secondary | ICD-10-CM | POA: Diagnosis not present

## 2023-12-22 DIAGNOSIS — N186 End stage renal disease: Secondary | ICD-10-CM | POA: Diagnosis not present

## 2023-12-22 DIAGNOSIS — Z992 Dependence on renal dialysis: Secondary | ICD-10-CM | POA: Diagnosis not present

## 2023-12-25 ENCOUNTER — Encounter: Payer: Self-pay | Admitting: Cardiology

## 2023-12-25 ENCOUNTER — Ambulatory Visit: Payer: Medicare HMO | Attending: Cardiology | Admitting: Cardiology

## 2023-12-25 VITALS — BP 138/70 | HR 70 | Ht 64.0 in | Wt 124.2 lb

## 2023-12-25 DIAGNOSIS — Z992 Dependence on renal dialysis: Secondary | ICD-10-CM

## 2023-12-25 DIAGNOSIS — E782 Mixed hyperlipidemia: Secondary | ICD-10-CM

## 2023-12-25 DIAGNOSIS — I10 Essential (primary) hypertension: Secondary | ICD-10-CM | POA: Diagnosis not present

## 2023-12-25 DIAGNOSIS — N186 End stage renal disease: Secondary | ICD-10-CM

## 2023-12-25 DIAGNOSIS — I25119 Atherosclerotic heart disease of native coronary artery with unspecified angina pectoris: Secondary | ICD-10-CM

## 2023-12-25 NOTE — Progress Notes (Signed)
    Cardiology Office Note  Date: 12/25/2023   ID: Katreena Achey Klemp, DOB 10/30/1940, MRN [id number]  History of Present Illness: Julia Santana is an 83 y.o. female last seen in October 2024.  She is here with her son for a follow-up visit.  Since last encounter she has started on hemodialysis, began in December 2024.  She dialyzes on Tuesdays, Thursdays, and Saturdays.  States that she is doing reasonably well.  Appetite has been better, her blood pressure is trending better as well.  She does not report any angina.  We went over her medications.  She reports compliance with treatment, I reviewed her interval lab work.  Physical Exam: VS:  BP 138/70   Pulse 70   Ht 5\' 4"  (1.626 m)   Wt 124 lb 3.2 oz (56.3 kg)   SpO2 100%   BMI 21.32 kg/m , BMI Body mass index is 21.32 kg/m.  Wt Readings from Last 3 Encounters:  12/25/23 124 lb 3.2 oz (56.3 kg)  07/17/23 126 lb (57.2 kg)  06/25/23 119 lb 9.6 oz (54.3 kg)    General: Patient appears comfortable at rest. HEENT: Conjunctiva and lids normal. Neck: Supple, no elevated JVP or carotid bruits. Lungs: Clear to auscultation, nonlabored breathing at rest. Cardiac: Regular rate and rhythm, no S3, 2/6 systolic murmur, no pericardial rub. Extremities: Left upper arm AV graft.  ECG:  An ECG dated 05/17/2023 was personally reviewed today and demonstrated:  Sinus rhythm with decreased R wave progression, rule out old inferior infarct pattern.  Labwork: April 2024: Cholesterol 129, triglycerides 69, HDL 48, LDL 67 04/23/2023: ALT 16; AST 25; Platelets 243 05/16/2023: BUN 43; Creatinine, Ser 4.70; Hemoglobin 9.9; Potassium 3.7; Sodium 134  December 2024: Hemoglobin 8.9, platelets 238, BUN 48, creatinine 4, GFR 11, potassium 4.3  Other Studies Reviewed Today:  Cardiac testing for review today.  Assessment and Plan:  1.  Multivessel CAD status post CABG in 2013 with LIMA to LAD, SVG to OM, and SVG to OM 2.  Echocardiogram in 2021 revealed LVEF  65 to 70%.  Lexiscan  Myoview  at that time revealed variable soft tissue attenuation at the apex versus minor ischemic territory.  She does not report any angina on medical therapy.  Continue aspirin  81 mg daily, Lipitor 40 mg daily, Imdur  30 mg daily, and Toprol -XL 100 mg daily.   2.  Primary hypertension.  No change to current regimen which also includes clonidine  0.1 mg twice daily and hydralazine  50 mg 3 times a day.   3.  ESRD on hemodialysis.  She dialyzes on Tuesdays, Thursdays, and Saturdays.   4.  Mixed hyperlipidemia.  LDL 67 in April 2024.  Continue Lipitor 40 mg daily.   5.  Asymptomatic carotid artery disease.  Carotid Dopplers in January indicated 1 to 39% RICA stenosis and 40 to 59% LICA stenosis.  Continue aspirin  and statin.  Disposition:  Follow up  6 months.  Signed, Gerard Knight, M.D., F.A.C.C. [facility]

## 2023-12-25 NOTE — Patient Instructions (Signed)

## 2023-12-26 DIAGNOSIS — Z992 Dependence on renal dialysis: Secondary | ICD-10-CM | POA: Diagnosis not present

## 2023-12-26 DIAGNOSIS — E782 Mixed hyperlipidemia: Secondary | ICD-10-CM | POA: Diagnosis not present

## 2023-12-26 DIAGNOSIS — K219 Gastro-esophageal reflux disease without esophagitis: Secondary | ICD-10-CM | POA: Diagnosis not present

## 2023-12-26 DIAGNOSIS — E213 Hyperparathyroidism, unspecified: Secondary | ICD-10-CM | POA: Diagnosis not present

## 2023-12-26 DIAGNOSIS — J45909 Unspecified asthma, uncomplicated: Secondary | ICD-10-CM | POA: Diagnosis not present

## 2023-12-26 DIAGNOSIS — N186 End stage renal disease: Secondary | ICD-10-CM | POA: Diagnosis not present

## 2023-12-27 DIAGNOSIS — N186 End stage renal disease: Secondary | ICD-10-CM | POA: Diagnosis not present

## 2023-12-27 DIAGNOSIS — Z992 Dependence on renal dialysis: Secondary | ICD-10-CM | POA: Diagnosis not present

## 2023-12-29 DIAGNOSIS — Z992 Dependence on renal dialysis: Secondary | ICD-10-CM | POA: Diagnosis not present

## 2023-12-29 DIAGNOSIS — N186 End stage renal disease: Secondary | ICD-10-CM | POA: Diagnosis not present

## 2024-01-01 DIAGNOSIS — N186 End stage renal disease: Secondary | ICD-10-CM | POA: Diagnosis not present

## 2024-01-01 DIAGNOSIS — Z992 Dependence on renal dialysis: Secondary | ICD-10-CM | POA: Diagnosis not present

## 2024-01-03 DIAGNOSIS — N186 End stage renal disease: Secondary | ICD-10-CM | POA: Diagnosis not present

## 2024-01-03 DIAGNOSIS — Z992 Dependence on renal dialysis: Secondary | ICD-10-CM | POA: Diagnosis not present

## 2024-01-05 DIAGNOSIS — Z992 Dependence on renal dialysis: Secondary | ICD-10-CM | POA: Diagnosis not present

## 2024-01-05 DIAGNOSIS — N186 End stage renal disease: Secondary | ICD-10-CM | POA: Diagnosis not present

## 2024-01-09 DIAGNOSIS — Z992 Dependence on renal dialysis: Secondary | ICD-10-CM | POA: Diagnosis not present

## 2024-01-09 DIAGNOSIS — N186 End stage renal disease: Secondary | ICD-10-CM | POA: Diagnosis not present

## 2024-01-12 DIAGNOSIS — Z992 Dependence on renal dialysis: Secondary | ICD-10-CM | POA: Diagnosis not present

## 2024-01-12 DIAGNOSIS — N186 End stage renal disease: Secondary | ICD-10-CM | POA: Diagnosis not present

## 2024-01-15 DIAGNOSIS — Z992 Dependence on renal dialysis: Secondary | ICD-10-CM | POA: Diagnosis not present

## 2024-01-15 DIAGNOSIS — N186 End stage renal disease: Secondary | ICD-10-CM | POA: Diagnosis not present

## 2024-01-17 DIAGNOSIS — N186 End stage renal disease: Secondary | ICD-10-CM | POA: Diagnosis not present

## 2024-01-17 DIAGNOSIS — Z992 Dependence on renal dialysis: Secondary | ICD-10-CM | POA: Diagnosis not present

## 2024-01-19 DIAGNOSIS — N186 End stage renal disease: Secondary | ICD-10-CM | POA: Diagnosis not present

## 2024-01-19 DIAGNOSIS — Z992 Dependence on renal dialysis: Secondary | ICD-10-CM | POA: Diagnosis not present

## 2024-01-22 DIAGNOSIS — Z992 Dependence on renal dialysis: Secondary | ICD-10-CM | POA: Diagnosis not present

## 2024-01-22 DIAGNOSIS — N186 End stage renal disease: Secondary | ICD-10-CM | POA: Diagnosis not present

## 2024-01-24 DIAGNOSIS — N186 End stage renal disease: Secondary | ICD-10-CM | POA: Diagnosis not present

## 2024-01-24 DIAGNOSIS — Z992 Dependence on renal dialysis: Secondary | ICD-10-CM | POA: Diagnosis not present

## 2024-01-25 DIAGNOSIS — J45909 Unspecified asthma, uncomplicated: Secondary | ICD-10-CM | POA: Diagnosis not present

## 2024-01-25 DIAGNOSIS — E213 Hyperparathyroidism, unspecified: Secondary | ICD-10-CM | POA: Diagnosis not present

## 2024-01-25 DIAGNOSIS — K219 Gastro-esophageal reflux disease without esophagitis: Secondary | ICD-10-CM | POA: Diagnosis not present

## 2024-01-25 DIAGNOSIS — E782 Mixed hyperlipidemia: Secondary | ICD-10-CM | POA: Diagnosis not present

## 2024-01-26 DIAGNOSIS — Z992 Dependence on renal dialysis: Secondary | ICD-10-CM | POA: Diagnosis not present

## 2024-01-26 DIAGNOSIS — N186 End stage renal disease: Secondary | ICD-10-CM | POA: Diagnosis not present

## 2024-01-29 DIAGNOSIS — Z992 Dependence on renal dialysis: Secondary | ICD-10-CM | POA: Diagnosis not present

## 2024-01-29 DIAGNOSIS — N186 End stage renal disease: Secondary | ICD-10-CM | POA: Diagnosis not present

## 2024-01-29 DIAGNOSIS — Z23 Encounter for immunization: Secondary | ICD-10-CM | POA: Diagnosis not present

## 2024-01-31 DIAGNOSIS — Z992 Dependence on renal dialysis: Secondary | ICD-10-CM | POA: Diagnosis not present

## 2024-01-31 DIAGNOSIS — N186 End stage renal disease: Secondary | ICD-10-CM | POA: Diagnosis not present

## 2024-01-31 DIAGNOSIS — Z23 Encounter for immunization: Secondary | ICD-10-CM | POA: Diagnosis not present

## 2024-02-01 ENCOUNTER — Other Ambulatory Visit: Payer: Self-pay | Admitting: Cardiology

## 2024-02-02 DIAGNOSIS — Z992 Dependence on renal dialysis: Secondary | ICD-10-CM | POA: Diagnosis not present

## 2024-02-02 DIAGNOSIS — N186 End stage renal disease: Secondary | ICD-10-CM | POA: Diagnosis not present

## 2024-02-02 DIAGNOSIS — Z23 Encounter for immunization: Secondary | ICD-10-CM | POA: Diagnosis not present

## 2024-02-04 NOTE — Telephone Encounter (Signed)
 Patient's son Charlann Confer informed and verbalized understanding of plan.

## 2024-02-05 DIAGNOSIS — N186 End stage renal disease: Secondary | ICD-10-CM | POA: Diagnosis not present

## 2024-02-05 DIAGNOSIS — Z992 Dependence on renal dialysis: Secondary | ICD-10-CM | POA: Diagnosis not present

## 2024-02-05 DIAGNOSIS — Z23 Encounter for immunization: Secondary | ICD-10-CM | POA: Diagnosis not present

## 2024-02-07 DIAGNOSIS — N186 End stage renal disease: Secondary | ICD-10-CM | POA: Diagnosis not present

## 2024-02-07 DIAGNOSIS — Z23 Encounter for immunization: Secondary | ICD-10-CM | POA: Diagnosis not present

## 2024-02-07 DIAGNOSIS — Z992 Dependence on renal dialysis: Secondary | ICD-10-CM | POA: Diagnosis not present

## 2024-02-09 DIAGNOSIS — Z23 Encounter for immunization: Secondary | ICD-10-CM | POA: Diagnosis not present

## 2024-02-09 DIAGNOSIS — Z992 Dependence on renal dialysis: Secondary | ICD-10-CM | POA: Diagnosis not present

## 2024-02-09 DIAGNOSIS — N186 End stage renal disease: Secondary | ICD-10-CM | POA: Diagnosis not present

## 2024-02-12 DIAGNOSIS — N186 End stage renal disease: Secondary | ICD-10-CM | POA: Diagnosis not present

## 2024-02-12 DIAGNOSIS — Z992 Dependence on renal dialysis: Secondary | ICD-10-CM | POA: Diagnosis not present

## 2024-02-12 DIAGNOSIS — Z23 Encounter for immunization: Secondary | ICD-10-CM | POA: Diagnosis not present

## 2024-02-14 DIAGNOSIS — Z23 Encounter for immunization: Secondary | ICD-10-CM | POA: Diagnosis not present

## 2024-02-14 DIAGNOSIS — N186 End stage renal disease: Secondary | ICD-10-CM | POA: Diagnosis not present

## 2024-02-14 DIAGNOSIS — Z992 Dependence on renal dialysis: Secondary | ICD-10-CM | POA: Diagnosis not present

## 2024-02-15 ENCOUNTER — Other Ambulatory Visit: Payer: Self-pay | Admitting: Cardiology

## 2024-02-16 DIAGNOSIS — Z23 Encounter for immunization: Secondary | ICD-10-CM | POA: Diagnosis not present

## 2024-02-16 DIAGNOSIS — Z992 Dependence on renal dialysis: Secondary | ICD-10-CM | POA: Diagnosis not present

## 2024-02-16 DIAGNOSIS — N186 End stage renal disease: Secondary | ICD-10-CM | POA: Diagnosis not present

## 2024-02-18 DIAGNOSIS — I5032 Chronic diastolic (congestive) heart failure: Secondary | ICD-10-CM | POA: Diagnosis not present

## 2024-02-18 DIAGNOSIS — I10 Essential (primary) hypertension: Secondary | ICD-10-CM | POA: Diagnosis not present

## 2024-02-18 DIAGNOSIS — Z681 Body mass index (BMI) 19 or less, adult: Secondary | ICD-10-CM | POA: Diagnosis not present

## 2024-02-18 DIAGNOSIS — I2510 Atherosclerotic heart disease of native coronary artery without angina pectoris: Secondary | ICD-10-CM | POA: Diagnosis not present

## 2024-02-18 DIAGNOSIS — Z992 Dependence on renal dialysis: Secondary | ICD-10-CM | POA: Diagnosis not present

## 2024-02-19 DIAGNOSIS — N186 End stage renal disease: Secondary | ICD-10-CM | POA: Diagnosis not present

## 2024-02-19 DIAGNOSIS — Z13220 Encounter for screening for lipoid disorders: Secondary | ICD-10-CM | POA: Diagnosis not present

## 2024-02-19 DIAGNOSIS — Z23 Encounter for immunization: Secondary | ICD-10-CM | POA: Diagnosis not present

## 2024-02-19 DIAGNOSIS — Z992 Dependence on renal dialysis: Secondary | ICD-10-CM | POA: Diagnosis not present

## 2024-02-21 DIAGNOSIS — Z992 Dependence on renal dialysis: Secondary | ICD-10-CM | POA: Diagnosis not present

## 2024-02-21 DIAGNOSIS — Z23 Encounter for immunization: Secondary | ICD-10-CM | POA: Diagnosis not present

## 2024-02-21 DIAGNOSIS — N186 End stage renal disease: Secondary | ICD-10-CM | POA: Diagnosis not present

## 2024-02-22 DIAGNOSIS — N186 End stage renal disease: Secondary | ICD-10-CM | POA: Diagnosis not present

## 2024-02-22 DIAGNOSIS — Z992 Dependence on renal dialysis: Secondary | ICD-10-CM | POA: Diagnosis not present

## 2024-02-22 DIAGNOSIS — Z23 Encounter for immunization: Secondary | ICD-10-CM | POA: Diagnosis not present

## 2024-02-23 DIAGNOSIS — N186 End stage renal disease: Secondary | ICD-10-CM | POA: Diagnosis not present

## 2024-02-23 DIAGNOSIS — Z992 Dependence on renal dialysis: Secondary | ICD-10-CM | POA: Diagnosis not present

## 2024-02-23 DIAGNOSIS — Z23 Encounter for immunization: Secondary | ICD-10-CM | POA: Diagnosis not present

## 2024-02-25 DIAGNOSIS — Z992 Dependence on renal dialysis: Secondary | ICD-10-CM | POA: Diagnosis not present

## 2024-02-25 DIAGNOSIS — K219 Gastro-esophageal reflux disease without esophagitis: Secondary | ICD-10-CM | POA: Diagnosis not present

## 2024-02-25 DIAGNOSIS — N186 End stage renal disease: Secondary | ICD-10-CM | POA: Diagnosis not present

## 2024-02-25 DIAGNOSIS — E782 Mixed hyperlipidemia: Secondary | ICD-10-CM | POA: Diagnosis not present

## 2024-02-25 DIAGNOSIS — J45909 Unspecified asthma, uncomplicated: Secondary | ICD-10-CM | POA: Diagnosis not present

## 2024-02-25 DIAGNOSIS — E213 Hyperparathyroidism, unspecified: Secondary | ICD-10-CM | POA: Diagnosis not present

## 2024-02-26 DIAGNOSIS — Z992 Dependence on renal dialysis: Secondary | ICD-10-CM | POA: Diagnosis not present

## 2024-02-26 DIAGNOSIS — N186 End stage renal disease: Secondary | ICD-10-CM | POA: Diagnosis not present

## 2024-02-28 DIAGNOSIS — Z992 Dependence on renal dialysis: Secondary | ICD-10-CM | POA: Diagnosis not present

## 2024-02-28 DIAGNOSIS — N186 End stage renal disease: Secondary | ICD-10-CM | POA: Diagnosis not present

## 2024-03-01 DIAGNOSIS — Z992 Dependence on renal dialysis: Secondary | ICD-10-CM | POA: Diagnosis not present

## 2024-03-01 DIAGNOSIS — N186 End stage renal disease: Secondary | ICD-10-CM | POA: Diagnosis not present

## 2024-03-04 DIAGNOSIS — Z992 Dependence on renal dialysis: Secondary | ICD-10-CM | POA: Diagnosis not present

## 2024-03-04 DIAGNOSIS — N186 End stage renal disease: Secondary | ICD-10-CM | POA: Diagnosis not present

## 2024-03-06 DIAGNOSIS — Z992 Dependence on renal dialysis: Secondary | ICD-10-CM | POA: Diagnosis not present

## 2024-03-06 DIAGNOSIS — N186 End stage renal disease: Secondary | ICD-10-CM | POA: Diagnosis not present

## 2024-03-08 DIAGNOSIS — Z992 Dependence on renal dialysis: Secondary | ICD-10-CM | POA: Diagnosis not present

## 2024-03-08 DIAGNOSIS — N186 End stage renal disease: Secondary | ICD-10-CM | POA: Diagnosis not present

## 2024-03-11 DIAGNOSIS — N186 End stage renal disease: Secondary | ICD-10-CM | POA: Diagnosis not present

## 2024-03-11 DIAGNOSIS — Z992 Dependence on renal dialysis: Secondary | ICD-10-CM | POA: Diagnosis not present

## 2024-03-13 DIAGNOSIS — Z992 Dependence on renal dialysis: Secondary | ICD-10-CM | POA: Diagnosis not present

## 2024-03-13 DIAGNOSIS — N186 End stage renal disease: Secondary | ICD-10-CM | POA: Diagnosis not present

## 2024-03-15 DIAGNOSIS — Z992 Dependence on renal dialysis: Secondary | ICD-10-CM | POA: Diagnosis not present

## 2024-03-15 DIAGNOSIS — N186 End stage renal disease: Secondary | ICD-10-CM | POA: Diagnosis not present

## 2024-03-17 ENCOUNTER — Other Ambulatory Visit: Payer: Self-pay | Admitting: Cardiology

## 2024-03-18 DIAGNOSIS — N186 End stage renal disease: Secondary | ICD-10-CM | POA: Diagnosis not present

## 2024-03-18 DIAGNOSIS — Z992 Dependence on renal dialysis: Secondary | ICD-10-CM | POA: Diagnosis not present

## 2024-03-20 DIAGNOSIS — Z992 Dependence on renal dialysis: Secondary | ICD-10-CM | POA: Diagnosis not present

## 2024-03-20 DIAGNOSIS — N186 End stage renal disease: Secondary | ICD-10-CM | POA: Diagnosis not present

## 2024-03-22 DIAGNOSIS — Z992 Dependence on renal dialysis: Secondary | ICD-10-CM | POA: Diagnosis not present

## 2024-03-22 DIAGNOSIS — N186 End stage renal disease: Secondary | ICD-10-CM | POA: Diagnosis not present

## 2024-03-25 ENCOUNTER — Emergency Department (HOSPITAL_COMMUNITY)

## 2024-03-25 ENCOUNTER — Other Ambulatory Visit: Payer: Self-pay

## 2024-03-25 ENCOUNTER — Emergency Department (HOSPITAL_COMMUNITY)
Admission: EM | Admit: 2024-03-25 | Discharge: 2024-03-25 | Disposition: A | Discharge status: Home / Self Care | Source: Other Acute Inpatient Hospital | Attending: Emergency Medicine | Admitting: Emergency Medicine

## 2024-03-25 DIAGNOSIS — Z992 Dependence on renal dialysis: Secondary | ICD-10-CM | POA: Diagnosis not present

## 2024-03-25 DIAGNOSIS — Z7982 Long term (current) use of aspirin: Secondary | ICD-10-CM | POA: Insufficient documentation

## 2024-03-25 DIAGNOSIS — I120 Hypertensive chronic kidney disease with stage 5 chronic kidney disease or end stage renal disease: Secondary | ICD-10-CM | POA: Insufficient documentation

## 2024-03-25 DIAGNOSIS — N186 End stage renal disease: Secondary | ICD-10-CM | POA: Insufficient documentation

## 2024-03-25 DIAGNOSIS — R7989 Other specified abnormal findings of blood chemistry: Secondary | ICD-10-CM | POA: Insufficient documentation

## 2024-03-25 DIAGNOSIS — R531 Weakness: Secondary | ICD-10-CM | POA: Diagnosis present

## 2024-03-25 DIAGNOSIS — I10 Essential (primary) hypertension: Secondary | ICD-10-CM | POA: Diagnosis not present

## 2024-03-25 DIAGNOSIS — Z951 Presence of aortocoronary bypass graft: Secondary | ICD-10-CM | POA: Insufficient documentation

## 2024-03-25 DIAGNOSIS — R5381 Other malaise: Secondary | ICD-10-CM | POA: Diagnosis not present

## 2024-03-25 DIAGNOSIS — R5383 Other fatigue: Secondary | ICD-10-CM | POA: Insufficient documentation

## 2024-03-25 LAB — CBC WITH DIFFERENTIAL/PLATELET
Abs Granulocyte: 4.6 K/uL (ref 1.5–6.5)
Abs Immature Granulocytes: 0.04 K/uL (ref 0.00–0.07)
Basophils Absolute: 0 K/uL (ref 0.0–0.1)
Basophils Relative: 1 %
Eosinophils Absolute: 0 K/uL (ref 0.0–0.5)
Eosinophils Relative: 0 %
HCT: 37.4 % (ref 36.0–46.0)
Hemoglobin: 12.4 g/dL (ref 12.0–15.0)
Immature Granulocytes: 1 %
Lymphocytes Relative: 12 %
Lymphs Abs: 0.7 K/uL (ref 0.7–4.0)
MCH: 31.5 pg (ref 26.0–34.0)
MCHC: 33.2 g/dL (ref 30.0–36.0)
MCV: 94.9 fL (ref 80.0–100.0)
Monocytes Absolute: 0.4 K/uL (ref 0.1–1.0)
Monocytes Relative: 7 %
Neutro Abs: 4.6 K/uL (ref 1.7–7.7)
Neutrophils Relative %: 79 %
Platelets: 141 K/uL — ABNORMAL LOW (ref 150–400)
RBC: 3.94 MIL/uL (ref 3.87–5.11)
RDW: 17.1 % — ABNORMAL HIGH (ref 11.5–15.5)
WBC: 5.8 K/uL (ref 4.0–10.5)
nRBC: 0 % (ref 0.0–0.2)

## 2024-03-25 LAB — COMPREHENSIVE METABOLIC PANEL WITH GFR
ALT: 10 U/L (ref 0–44)
AST: 21 U/L (ref 15–41)
Albumin: 3.3 g/dL — ABNORMAL LOW (ref 3.5–5.0)
Alkaline Phosphatase: 94 U/L (ref 38–126)
Anion gap: 16 — ABNORMAL HIGH (ref 5–15)
BUN: 18 mg/dL (ref 8–23)
CO2: 28 mmol/L (ref 22–32)
Calcium: 8.6 mg/dL — ABNORMAL LOW (ref 8.9–10.3)
Chloride: 90 mmol/L — ABNORMAL LOW (ref 98–111)
Creatinine, Ser: 4.95 mg/dL — ABNORMAL HIGH (ref 0.44–1.00)
GFR, Estimated: 8 mL/min — ABNORMAL LOW (ref >60)
Glucose, Bld: 79 mg/dL (ref 70–99)
Potassium: 3.5 mmol/L (ref 3.5–5.1)
Sodium: 134 mmol/L — ABNORMAL LOW (ref 135–145)
Total Bilirubin: 1 mg/dL (ref 0.0–1.2)
Total Protein: 6.5 g/dL (ref 6.5–8.1)

## 2024-03-25 NOTE — ED Notes (Signed)
 Pt tolerated PO challenge

## 2024-03-25 NOTE — ED Provider Notes (Signed)
 [place] EMERGENCY DEPARTMENT AT [facility] Provider Note   CSN: [id number] Arrival date & time: 03/25/24  1304     Patient presents with: Hypertension   Julia Santana is a 83 y.o. female.  She has history of ESRD patient on dialysis Tuesday Thursday Saturday.  She reports after dialysis on Saturday she was feeling generally weak, was feeling unwell for the past 2 days as well.  She has been most of her time resting.  Today she went to dialysis and was only able to dialyze for about 30 minutes they checked her blood pressure routinely was 183/160 and they advised her to come to the ER due to this elevated blood pressure.  Denies any chest pain or shortness breath, no severe headache, no numbness ting or weakness.  No severe abdominal pain, no nausea vomiting or diarrhea.  No fevers or chills.  Her son is at bedside and notes that she has been significantly restricting her p.o. fluid intake for the past week or so because they told her at dialysis that she was likely drinking too much fluids.  He thinks she is not drinking enough.  She at baseline is an uric.    Hypertension       Prior to Admission medications   Medication Sig Start Date End Date Taking? Authorizing Provider  aspirin  81 MG tablet Take 162 mg by mouth daily.    [provider]  atorvastatin  (LIPITOR) 40 MG tablet Take 1 tablet (40 mg total) by mouth every evening.    Debera Jayson MATSU, MD  cloNIDine  (CATAPRES ) 0.1 MG tablet Take 1 tablet by mouth twice daily 02/01/24   Debera Jayson MATSU, MD  famotidine  (PEPCID ) 20 MG tablet Take 1 tablet (20 mg total) by mouth 2 (two) times daily. Patient taking differently: Take 20 mg by mouth 3 (three) times daily. 04/23/23 12/24/24  Cottie Donnice PARAS, MD  hydrALAZINE  (APRESOLINE ) 50 MG tablet Take 50 mg by mouth 3 (three) times daily.    [provider]  isosorbide  mononitrate (IMDUR ) 30 MG 24 hr tablet Take 1 tablet by mouth once daily 03/19/24    McDowell, Samuel G, MD  metoprolol  succinate (TOPROL -XL) 100 MG 24 hr tablet Take 100 mg by mouth daily. 02/02/22   [provider]  pantoprazole  (PROTONIX ) 40 MG tablet Take 40 mg by mouth 2 (two) times daily.     [provider]  torsemide  (DEMADEX ) 100 MG tablet Take 1/2 (one-half) tablet by mouth once daily 02/15/24   Debera Jayson MATSU, MD  traMADol  (ULTRAM ) 50 MG tablet Take 1 tablet (50 mg total) by mouth every 6 (six) hours as needed for up to 20 doses for severe pain. 05/16/23   Bethanie Donnice, PA-C    Allergies: Cortisone and Furosemide     Review of Systems  Updated Vital Signs BP (!) 113/55   Pulse 81   Temp 98.6 F (37 C) (Oral)   Resp 18   Wt 55.3 kg   SpO2 95%   BMI 20.94 kg/m   Physical Exam Vitals and nursing note reviewed.  Constitutional:      General: She is not in acute distress.    Appearance: She is well-developed.  HENT:     Head: Normocephalic and atraumatic.     Mouth/Throat:     Mouth: Mucous membranes are moist.  Eyes:     Conjunctiva/sclera: Conjunctivae normal.  Cardiovascular:     Rate and Rhythm: Normal rate and regular rhythm.  Heart sounds: No murmur heard. Pulmonary:     Effort: Pulmonary effort is normal. No respiratory distress.     Breath sounds: Normal breath sounds.  Abdominal:     Palpations: Abdomen is soft.     Tenderness: There is no abdominal tenderness.  Musculoskeletal:        General: No swelling.     Cervical back: Neck supple.  Skin:    General: Skin is warm and dry.     Capillary Refill: Capillary refill takes less than 2 seconds.  Neurological:     General: No focal deficit present.     Mental Status: She is alert and oriented to person, place, and time.  Psychiatric:        Mood and Affect: Mood normal.     (all labs ordered are listed, but only abnormal results are displayed) Labs Reviewed  COMPREHENSIVE METABOLIC PANEL WITH GFR - Abnormal; Notable for the following components:       Result Value   Sodium 134 (*)    Chloride 90 (*)    Creatinine, Ser 4.95 (*)    Calcium  8.6 (*)    Albumin  3.3 (*)    GFR, Estimated 8 (*)    Anion gap 16 (*)    All other components within normal limits  CBC WITH DIFFERENTIAL/PLATELET - Abnormal; Notable for the following components:   RDW 17.1 (*)    Platelets 141 (*)    All other components within normal limits    EKG: EKG Interpretation Date/Time:  Tuesday March 25 2024 14:15:28 EDT Ventricular Rate:  77 PR Interval:  206 QRS Duration:  76 QT Interval:  390 QTC Calculation: 441 R Axis:   20  Text Interpretation: Sinus rhythm with Premature atrial complexes Low voltage QRS Cannot rule out Anterior infarct (cited on or before 30-Mar-2012) Abnormal ECG Confirmed by Garrick Charleston [phone]) on 03/25/2024 2:33:56 PM  Radiology: DG Chest Portable 1 View Result Date: 03/25/2024 CLINICAL DATA:  Malaise. EXAM: PORTABLE CHEST 1 VIEW COMPARISON:  December 17, 2023. FINDINGS: Stable cardiomediastinal silhouette. Status post coronary artery bypass graft. No acute pulmonary disease. Bony thorax is unremarkable. IMPRESSION: No active disease. Electronically Signed   By: Lynwood Landy Raddle M.D.   On: 03/25/2024 15:51     Procedures   Medications Ordered in the ED - No data to display                                  Medical Decision Making This patient presents to the ED for concern of neurolysed weakness x 3 days, this involves an extensive number of treatment options, and is a complaint that carries with it a high risk of complications and morbidity.  The differential diagnosis includes dehydration, electrolyte abnormality, anemia, arrhythmia, other   Co morbidities that complicate the patient evaluation  ESRD on dialysis Thursday Saturday   Additional history obtained:  Additional history obtained from EMR External records from outside source obtained and reviewed including prior notes, labs and imaging   Lab Tests:  I Ordered,  and personally interpreted labs.  The pertinent results include: CBC with no anemia, no leukocytosis, CMP with BUN and creatinine elevated but normal potassium consistent with patient's ESRD   Imaging Studies ordered:  I ordered imaging studies including x-ray I independently visualized and interpreted imaging which showed no pulmonary edema or infiltrate, status post CABG I agree with the radiologist interpretation   Problem  List / ED Course / Critical interventions / Medication management   Was sent in for elevated blood pressure at dialysis today, blood pressure is now normal, no chest pain or shortness of breath but she has had some fatigue for the past several days.  She thinks it is related to the fact that she has been drinking much less for the past week after being told by dialysis that she was drinking too much.  After some p.o. fluids in the ER she is feeling much better.  Her labs her hemoglobin is normal, it is slightly increased from her baseline of approximately 9-10 to 12.4 today which could represent some hemoconcentration.  Mild, cytopenia noted she is not orthostatic.  EKG was unremarkable.  Advised on close outpatient follow-up and strict return precautions. Reevaluation of the patient after these medicines showed that the patient  I have reviewed the patients home medicines and have made adjustments as needed       Amount and/or Complexity of Data Reviewed Independent Historian:     Details: Patient some helps with history External Data Reviewed: labs and notes. Labs: ordered. Radiology: ordered.        Final diagnoses:  None    ED Discharge Orders     None          Suellen Sherran DELENA DEVONNA 03/25/24 1639    Towana Ozell BROCKS, MD 03/26/24 [phone]

## 2024-03-25 NOTE — ED Triage Notes (Signed)
 Pt BIB [facility] for HTN (183/160) during dialysis treatment. EMS pressure was 174/53. Hx of HTN, took all medications this morning.

## 2024-03-25 NOTE — Discharge Instructions (Addendum)
 You were seen today for evaluation of generalized fatigue and weakness for the past several days.  This may be due to your decreased oral intake.  Do not exceed your daily fluid limitations but make sure you are meeting your fluid requirements.  Follow-up with dialysis and your PCP.  Come back to the ER for new or worsening symptoms.

## 2024-03-27 DIAGNOSIS — N186 End stage renal disease: Secondary | ICD-10-CM | POA: Diagnosis not present

## 2024-03-27 DIAGNOSIS — Z992 Dependence on renal dialysis: Secondary | ICD-10-CM | POA: Diagnosis not present

## 2024-03-28 ENCOUNTER — Emergency Department (HOSPITAL_COMMUNITY)

## 2024-03-28 ENCOUNTER — Encounter (HOSPITAL_COMMUNITY): Payer: Self-pay

## 2024-03-28 ENCOUNTER — Emergency Department (HOSPITAL_COMMUNITY)
Admission: EM | Admit: 2024-03-28 | Discharge: 2024-03-28 | Disposition: A | Discharge status: Home / Self Care | Attending: Emergency Medicine | Admitting: Emergency Medicine

## 2024-03-28 DIAGNOSIS — M11261 Other chondrocalcinosis, right knee: Secondary | ICD-10-CM | POA: Diagnosis not present

## 2024-03-28 DIAGNOSIS — N186 End stage renal disease: Secondary | ICD-10-CM | POA: Diagnosis not present

## 2024-03-28 DIAGNOSIS — M160 Bilateral primary osteoarthritis of hip: Secondary | ICD-10-CM | POA: Diagnosis not present

## 2024-03-28 DIAGNOSIS — I878 Other specified disorders of veins: Secondary | ICD-10-CM | POA: Diagnosis not present

## 2024-03-28 DIAGNOSIS — M25511 Pain in right shoulder: Secondary | ICD-10-CM | POA: Diagnosis not present

## 2024-03-28 DIAGNOSIS — M549 Dorsalgia, unspecified: Secondary | ICD-10-CM | POA: Diagnosis not present

## 2024-03-28 DIAGNOSIS — J9811 Atelectasis: Secondary | ICD-10-CM | POA: Diagnosis not present

## 2024-03-28 DIAGNOSIS — M7651 Patellar tendinitis, right knee: Secondary | ICD-10-CM | POA: Diagnosis not present

## 2024-03-28 DIAGNOSIS — Z992 Dependence on renal dialysis: Secondary | ICD-10-CM | POA: Diagnosis not present

## 2024-03-28 DIAGNOSIS — M79604 Pain in right leg: Secondary | ICD-10-CM | POA: Diagnosis not present

## 2024-03-28 DIAGNOSIS — M1711 Unilateral primary osteoarthritis, right knee: Secondary | ICD-10-CM | POA: Diagnosis not present

## 2024-03-28 DIAGNOSIS — M19011 Primary osteoarthritis, right shoulder: Secondary | ICD-10-CM | POA: Diagnosis not present

## 2024-03-28 DIAGNOSIS — Z7982 Long term (current) use of aspirin: Secondary | ICD-10-CM | POA: Diagnosis not present

## 2024-03-28 DIAGNOSIS — I700 Atherosclerosis of aorta: Secondary | ICD-10-CM | POA: Diagnosis not present

## 2024-03-28 DIAGNOSIS — M25551 Pain in right hip: Secondary | ICD-10-CM | POA: Diagnosis not present

## 2024-03-28 DIAGNOSIS — I120 Hypertensive chronic kidney disease with stage 5 chronic kidney disease or end stage renal disease: Secondary | ICD-10-CM | POA: Diagnosis not present

## 2024-03-28 DIAGNOSIS — M79606 Pain in leg, unspecified: Secondary | ICD-10-CM | POA: Diagnosis not present

## 2024-03-28 LAB — CBC WITH DIFFERENTIAL/PLATELET
Basophils Absolute: 0 K/uL (ref 0.0–0.1)
Basophils Relative: 0 %
Eosinophils Absolute: 0 K/uL (ref 0.0–0.5)
Eosinophils Relative: 0 %
HCT: 36 % (ref 36.0–46.0)
Hemoglobin: 12.1 g/dL (ref 12.0–15.0)
Lymphocytes Relative: 10 %
Lymphs Abs: 1.1 K/uL (ref 0.7–4.0)
MCH: 30.2 pg (ref 26.0–34.0)
MCHC: 33.6 g/dL (ref 30.0–36.0)
MCV: 89.8 fL (ref 80.0–100.0)
Monocytes Absolute: 1.1 K/uL — ABNORMAL HIGH (ref 0.1–1.0)
Monocytes Relative: 10 %
Neutro Abs: 8.9 K/uL — ABNORMAL HIGH (ref 1.7–7.7)
Neutrophils Relative %: 80 %
Platelets: 139 K/uL — ABNORMAL LOW (ref 150–400)
RBC: 4.01 MIL/uL (ref 3.87–5.11)
RDW: 16.8 % — ABNORMAL HIGH (ref 11.5–15.5)
Smear Review: NORMAL
WBC: 11.1 K/uL — ABNORMAL HIGH (ref 4.0–10.5)
nRBC: 0 % (ref 0.0–0.2)

## 2024-03-28 LAB — BASIC METABOLIC PANEL WITH GFR
Anion gap: 21 — ABNORMAL HIGH (ref 5–15)
BUN: 48 mg/dL — ABNORMAL HIGH (ref 8–23)
CO2: 23 mmol/L (ref 22–32)
Calcium: 9.1 mg/dL (ref 8.9–10.3)
Chloride: 87 mmol/L — ABNORMAL LOW (ref 98–111)
Creatinine, Ser: 8.28 mg/dL — ABNORMAL HIGH (ref 0.44–1.00)
GFR, Estimated: 4 mL/min — ABNORMAL LOW (ref >60)
Glucose, Bld: 103 mg/dL — ABNORMAL HIGH (ref 70–99)
Potassium: 3.5 mmol/L (ref 3.5–5.1)
Sodium: 131 mmol/L — ABNORMAL LOW (ref 135–145)

## 2024-03-28 LAB — SEDIMENTATION RATE: Sed Rate: 17 mm/h (ref 0–30)

## 2024-03-28 LAB — LACTIC ACID, PLASMA
Lactic Acid, Venous: 1.4 mmol/L (ref 0.5–1.9)
Lactic Acid, Venous: 1.4 mmol/L (ref 0.5–1.9)

## 2024-03-28 LAB — C-REACTIVE PROTEIN: CRP: 23.9 mg/dL — ABNORMAL HIGH (ref <1.0)

## 2024-03-28 MED ORDER — FENTANYL CITRATE (PF) 100 MCG/2ML IJ SOLN
50.0000 ug | Freq: Once | INTRAMUSCULAR | Status: AC
  Administered 2024-03-28: 50 ug via INTRAVENOUS
  Filled 2024-03-28: qty 2

## 2024-03-28 MED ORDER — OXYCODONE-ACETAMINOPHEN 5-325 MG PO TABS
1.0000 | ORAL_TABLET | Freq: Three times a day (TID) | ORAL | 0 refills | Status: AC | PRN
Start: 2024-03-28 — End: ?

## 2024-03-28 MED ORDER — OXYCODONE-ACETAMINOPHEN 5-325 MG PO TABS
1.0000 | ORAL_TABLET | Freq: Once | ORAL | Status: AC
  Administered 2024-03-28: 1 via ORAL
  Filled 2024-03-28: qty 1

## 2024-03-28 NOTE — ED Triage Notes (Signed)
 Pt comes in for leg pain and back pain. Pt is on dialysis and missed her Tuesday and Thursday appointment. On Tuesday, pt came to the ED due to weakness and fatigue. A&Ox4. Pt is very hard of hearing.

## 2024-03-28 NOTE — Discharge Instructions (Signed)
 You were seen in the emergency department for hip pain shoulder pain knee pain.  You had x-rays of those areas that showed arthritis but no fracture or dislocation.  It will be important that you go to dialysis tomorrow as scheduled.  We are prescribing you a short course of pain medication.  Please use caution with this medicine as it may make you tired and constipated.  Review this with your primary care doctor and your kidney doctor.  Return if any worsening or concerning symptoms

## 2024-03-28 NOTE — ED Provider Notes (Signed)
 [place] EMERGENCY DEPARTMENT AT [facility] Provider Note   CSN: [id number] Arrival date & time: 03/28/24  1416     Patient presents with: Back Pain and Leg Pain   Julia Santana is a 83 y.o. female.  She has a history of end-stage renal disease and is on dialysis Tuesday Thursday Saturday.  She was here Tuesday for evaluation of elevated blood pressure.  Unremarkable workup and sent home.  She said she did not get dialysis Tuesday or Thursday.  Has had significant pain in her right leg, mostly hip and knee along with right shoulder which is causing her difficulty moving and ambulating.  She said normally she walks cleans and cooks without any difficulty.  No headache or neck pain no chest or abdominal pain no vomiting or diarrhea.  No numbness or weakness.  Pain is worse with movement and she rates it as severe.  Tried Tylenol  without any improvement.  No recent falls.   The history is provided by the patient.  Leg Pain Location:  Hip and knee Time since incident:  4 days Injury: no   Hip location:  R hip Knee location:  R knee Pain details:    Quality:  Aching   Severity:  Severe   Onset quality:  Gradual   Timing:  Constant   Progression:  Unchanged Chronicity:  New Relieved by:  Nothing Worsened by:  Bearing weight and activity Ineffective treatments:  Acetaminophen  Associated symptoms: back pain   Associated symptoms: no fever, no neck pain, no numbness and no swelling        Prior to Admission medications   Medication Sig Start Date End Date Taking? Authorizing Provider  aspirin  81 MG tablet Take 162 mg by mouth daily.    [provider]  atorvastatin  (LIPITOR) 40 MG tablet Take 1 tablet (40 mg total) by mouth every evening.    Debera Jayson MATSU, MD  cloNIDine  (CATAPRES ) 0.1 MG tablet Take 1 tablet by mouth twice daily 02/01/24   Debera Jayson MATSU, MD  famotidine  (PEPCID ) 20 MG tablet Take 1 tablet (20 mg total) by mouth 2 (two) times  daily. Patient taking differently: Take 20 mg by mouth 3 (three) times daily. 04/23/23 12/24/24  Cottie Donnice PARAS, MD  hydrALAZINE  (APRESOLINE ) 50 MG tablet Take 50 mg by mouth 3 (three) times daily.    [provider]  isosorbide  mononitrate (IMDUR ) 30 MG 24 hr tablet Take 1 tablet by mouth once daily 03/19/24   McDowell, Samuel G, MD  metoprolol  succinate (TOPROL -XL) 100 MG 24 hr tablet Take 100 mg by mouth daily. 02/02/22   [provider]  pantoprazole  (PROTONIX ) 40 MG tablet Take 40 mg by mouth 2 (two) times daily.     [provider]  torsemide  (DEMADEX ) 100 MG tablet Take 1/2 (one-half) tablet by mouth once daily 02/15/24   Debera Jayson MATSU, MD  traMADol  (ULTRAM ) 50 MG tablet Take 1 tablet (50 mg total) by mouth every 6 (six) hours as needed for up to 20 doses for severe pain. 05/16/23   Bethanie Donnice, PA-C    Allergies: Cortisone and Furosemide     Review of Systems  Constitutional:  Negative for fever.  Musculoskeletal:  Positive for back pain. Negative for neck pain.    Updated Vital Signs BP (!) 109/55 (BP Location: Right Arm)   Pulse 61   Temp 98.4 F (36.9 C) (Oral)   Resp 18   Ht 5' 4 (1.626 m)   Wt 55.3  kg   SpO2 96%   BMI 20.94 kg/m   Physical Exam Vitals and nursing note reviewed.  Constitutional:      General: She is not in acute distress.    Appearance: Normal appearance. She is well-developed.  HENT:     Head: Normocephalic and atraumatic.  Eyes:     Conjunctiva/sclera: Conjunctivae normal.  Cardiovascular:     Rate and Rhythm: Normal rate and regular rhythm.     Heart sounds: No murmur heard. Pulmonary:     Effort: Pulmonary effort is normal. No respiratory distress.     Breath sounds: Normal breath sounds. No stridor. No wheezing.  Abdominal:     Palpations: Abdomen is soft.     Tenderness: There is no abdominal tenderness. There is no guarding or rebound.  Musculoskeletal:        General: Tenderness present. No  deformity. Normal range of motion.     Cervical back: Neck supple.     Comments: She has pain with range of motion of her right lower extremity.  Diffuse tenderness around her knee and hip.  Has good distal pulses.  Normal strength.  Skin:    General: Skin is warm and dry.  Neurological:     General: No focal deficit present.     Mental Status: She is alert and oriented to person, place, and time.     GCS: GCS eye subscore is 4. GCS verbal subscore is 5. GCS motor subscore is 6.     Sensory: No sensory deficit.     Motor: No weakness.     (all labs ordered are listed, but only abnormal results are displayed) Labs Reviewed  BASIC METABOLIC PANEL WITH GFR - Abnormal; Notable for the following components:      Result Value   Sodium 131 (*)    Chloride 87 (*)    Glucose, Bld 103 (*)    BUN 48 (*)    Creatinine, Ser 8.28 (*)    GFR, Estimated 4 (*)    Anion gap 21 (*)    All other components within normal limits  CBC WITH DIFFERENTIAL/PLATELET - Abnormal; Notable for the following components:   WBC 11.1 (*)    RDW 16.8 (*)    Platelets 139 (*)    Neutro Abs 8.9 (*)    Monocytes Absolute 1.1 (*)    All other components within normal limits  C-REACTIVE PROTEIN - Abnormal; Notable for the following components:   CRP 23.9 (*)    All other components within normal limits  LACTIC ACID, PLASMA  LACTIC ACID, PLASMA  SEDIMENTATION RATE    EKG: None  Radiology: CT Lumbar Spine Wo Contrast Result Date: 03/29/2024 CLINICAL DATA:  83 year old female with low back pain. Pain radiating to the right leg. EXAM: CT LUMBAR SPINE WITHOUT CONTRAST TECHNIQUE: Multidetector CT imaging of the lumbar spine was performed without intravenous contrast administration. Multiplanar CT image reconstructions were also generated. RADIATION DOSE REDUCTION: This exam was performed according to the departmental dose-optimization program which includes automated exposure control, adjustment of the mA and/or kV  according to patient size and/or use of iterative reconstruction technique. COMPARISON:  Report of CT Abdomen and Pelvis 04/22/2023 (no images available). FINDINGS: Segmentation: Normal. Alignment: Mild levoconvex lower thoracic and dextroconvex lumbar scoliosis. Maintained lumbar lordosis with multilevel mild degenerative grade 1 anterolisthesis, most pronounced at L4-L5 in measuring 4-5 mm there. Similar mild retrolisthesis L5-S1. Vertebrae: Mild osteopenia for age. Maintained visible vertebral height. Visible lower ribs also appear intact.  Lumbar vertebrae intact. Visible sacrum and SI joints intact with SI joint degeneration bilaterally. Paraspinal and other soft tissues: Extensive Aortoiliac calcified atherosclerosis. Normal caliber abdominal aorta. Vascular patency is not evaluated in the absence of IV contrast. Advanced visceral calcified atherosclerosis also. Partial bilateral renal atrophy. Grossly negative visible costophrenic angles. Lumbar paraspinal soft tissues within normal limits. Disc levels: Mild for age lower thoracic and upper lumbar spine degeneration through L1-L2. L2-L3 and L3-L4 mild anterolisthesis. Disc, disc osteophyte complex, mild to moderate posterior element hypertrophy. No significant spinal stenosis. L4-L5: Grade 1 anterolisthesis with more advanced disc degeneration, vacuum disc, bulky pseudo disc or disc osteophyte complex. Bulky moderate to severe facet and ligament flavum hypertrophy. Moderate spinal stenosis with suspected severe lateral recess stenosis L5 nerve levels (series 4, image 84). Moderate to severe bilateral L4 foraminal stenosis. L5-S1: Retrolisthesis with disc degeneration and bulky disc osteophyte complex to the right. Less pronounced posterior element hypertrophy. No significant spinal stenosis. No high-grade lateral recess stenosis. But moderate to severe right L5 foraminal stenosis. IMPRESSION: 1. No acute osseous abnormality in the Lumbar Spine. Mild scoliosis  and multilevel lumbar spondylolisthesis. 2. Moderate multifactorial spinal and up to severe lateral recess and foraminal stenosis suspected at L4-L5. And severe right foraminal stenosis at L5-S1. Query right L4 and/or L5 radiculitis. 3. Mild for age lumbar spine degeneration above L4. 4. Severe calcified atherosclerosis. Partial renal atrophy. Aortic Atherosclerosis (ICD10-I70.0). Electronically Signed   By: VEAR Hurst M.D.   On: 03/29/2024 05:20   DG Chest 1 View Result Date: 03/28/2024 CLINICAL DATA:  Leg pain and back pain. EXAM: CHEST  1 VIEW COMPARISON:  03/25/2024 FINDINGS: Postoperative changes in the mediastinum. Heart size and pulmonary vascularity are normal for technique. Developing linear atelectasis in the lung bases since previous study. No airspace disease or consolidation. No pleural effusion or pneumothorax. Mediastinal contours appear intact. Degenerative changes in the spine and shoulders. Calcification of the aorta. IMPRESSION: Developing linear atelectasis in the lung bases. No focal consolidation. Electronically Signed   By: Elsie Gravely M.D.   On: 03/28/2024 17:53   DG Knee Complete 4 Views Right Result Date: 03/28/2024 CLINICAL DATA:  Right leg pain. EXAM: RIGHT KNEE - COMPLETE 4+ VIEW COMPARISON:  No prior comparison studies are available. FINDINGS: Degenerative changes in the right knee with medial greater than lateral compartment narrowing. Minimal osteophyte formation. Prominent patellar enthesophytes. Calcification in the medial and lateral compartment consistent with chondrocalcinosis. No evidence of acute fracture or dislocation. No focal bone lesion or bone destruction. No significant effusions. Vascular calcifications. IMPRESSION: Moderate degenerative changes in the right knee with chondrocalcinosis. No acute bony abnormalities. Electronically Signed   By: Elsie Gravely M.D.   On: 03/28/2024 17:51   DG Hip Unilat With Pelvis 2-3 Views Right Result Date: 03/28/2024 CLINICAL  DATA:  Back pain and leg pain. EXAM: DG HIP (WITH OR WITHOUT PELVIS) 2-3V RIGHT COMPARISON:  None Available. FINDINGS: Degenerative changes in the lower lumbar spine and hips. Right hip demonstrates moderate degenerative change with joint space narrowing and acetabular sclerosis as well as small osteophyte formation on both sides of the joint. No evidence of acute fracture or dislocation of the right hip. Pelvis appears intact. SI joints and symphysis pubis are not displaced. Vascular calcifications. Calcified phleboliths. Focal calcification in the left pelvis likely represents a calcified uterine fibroid. IMPRESSION: Moderate degenerative changes in the right hip. No acute displaced fractures are identified. Electronically Signed   By: Elsie Gravely M.D.   On: 03/28/2024  17:50   DG Shoulder Right Result Date: 03/28/2024 CLINICAL DATA:  Right shoulder pain. EXAM: RIGHT SHOULDER - 2+ VIEW COMPARISON:  None Available. FINDINGS: Degenerative changes in the glenohumeral and acromioclavicular joints. Calcification in the subacromial space suggesting calcific tendinosis. No evidence of acute fracture or dislocation. No focal bone lesion or bone destruction. Soft tissues are unremarkable. Postoperative changes in the mediastinum. IMPRESSION: Degenerative changes in the right shoulder. Subacromial calcification suggesting calcific tendinosis. No acute bony abnormalities. Electronically Signed   By: Elsie Gravely M.D.   On: 03/28/2024 17:49     Procedures   Medications Ordered in the ED  fentaNYL  (SUBLIMAZE ) injection 50 mcg (50 mcg Intravenous Given 03/28/24 1656)  oxyCODONE -acetaminophen  (PERCOCET/ROXICET) 5-325 MG per tablet 1 tablet (1 tablet Oral Given 03/28/24 1839)    Clinical Course as of 03/29/24 1006  Fri Mar 28, 2024  1828 Patient's lab work fairly unremarkable in the setting of CKD.  X-ray showing degenerative changes no acute findings.  Will try her with some oral pain medicines to see if we  can get her comfortable enough to be able to go home. [MB]  1912 Patient states she feels better after the pain medicine and feels like she will be able to ambulate and go home.  Family member is here and is comfortable plan for discharge.  We discussed side effects of the medicine and how it is not ideal to be these kind of medicines in the setting of renal failure but she will need to discuss this with her doctors.  They are comfortable plan for discharge. [MB]    Clinical Course User Index [MB] Towana Ozell BROCKS, MD                                 Medical Decision Making Amount and/or Complexity of Data Reviewed Labs: ordered. Radiology: ordered.  Risk Prescription drug management.   This patient complains of pain in her right hip and knee right shoulder; this involves an extensive number of treatment Options and is a complaint that carries with it a high risk of complications and morbidity. The differential includes arthritis, fracture, dislocation, metabolic derangement  I ordered, reviewed and interpreted labs, which included CBC with elevated white count and low platelets, chemistries consistent with her CKD but no evidence of hyperkalemia, inflammatory markers mildly elevated, lactate normal I ordered medication IV and oral pain medicine and reviewed PMP when indicated. I ordered imaging studies which included x-rays of chest knee hip and shoulder and I independently    visualized and interpreted imaging which showed degenerative changes Additional history obtained from patient's family members Previous records obtained and reviewed in epic including recent ED visits Cardiac monitoring reviewed, sinus rhythm Social determinants considered, no significant barriers Critical Interventions: None  After the interventions stated above, I reevaluated the patient and found patient to be symptomatically feeling better and feels like she can manage her symptoms at home Admission and  further testing considered, patient feels she is appropriate for discharge.  Will provide prescription for pain medicine.  Recommended close follow-up with PCP and need for attending dialysis.  Return instructions discussed.      Final diagnoses:  Right leg pain  Acute pain of right shoulder  ESRD (end stage renal disease) on dialysis [facility])    ED Discharge Orders     None          Towana Ozell BROCKS, MD 03/29/24 1008

## 2024-03-29 ENCOUNTER — Emergency Department (HOSPITAL_COMMUNITY)

## 2024-03-29 ENCOUNTER — Emergency Department (HOSPITAL_COMMUNITY)
Admission: EM | Admit: 2024-03-29 | Discharge: 2024-03-29 | Disposition: A | Discharge status: Home / Self Care | Attending: Student | Admitting: Student

## 2024-03-29 ENCOUNTER — Other Ambulatory Visit: Payer: Self-pay

## 2024-03-29 ENCOUNTER — Encounter (HOSPITAL_COMMUNITY): Payer: Self-pay | Admitting: Emergency Medicine

## 2024-03-29 DIAGNOSIS — Z79899 Other long term (current) drug therapy: Secondary | ICD-10-CM | POA: Insufficient documentation

## 2024-03-29 DIAGNOSIS — Z7982 Long term (current) use of aspirin: Secondary | ICD-10-CM | POA: Diagnosis not present

## 2024-03-29 DIAGNOSIS — M4807 Spinal stenosis, lumbosacral region: Secondary | ICD-10-CM | POA: Diagnosis not present

## 2024-03-29 DIAGNOSIS — Z992 Dependence on renal dialysis: Secondary | ICD-10-CM | POA: Diagnosis not present

## 2024-03-29 DIAGNOSIS — Z87891 Personal history of nicotine dependence: Secondary | ICD-10-CM | POA: Diagnosis not present

## 2024-03-29 DIAGNOSIS — I120 Hypertensive chronic kidney disease with stage 5 chronic kidney disease or end stage renal disease: Secondary | ICD-10-CM | POA: Insufficient documentation

## 2024-03-29 DIAGNOSIS — M5441 Lumbago with sciatica, right side: Secondary | ICD-10-CM | POA: Diagnosis not present

## 2024-03-29 DIAGNOSIS — R531 Weakness: Secondary | ICD-10-CM | POA: Diagnosis not present

## 2024-03-29 DIAGNOSIS — M4316 Spondylolisthesis, lumbar region: Secondary | ICD-10-CM | POA: Diagnosis not present

## 2024-03-29 DIAGNOSIS — M48061 Spinal stenosis, lumbar region without neurogenic claudication: Secondary | ICD-10-CM | POA: Diagnosis not present

## 2024-03-29 DIAGNOSIS — Z8616 Personal history of COVID-19: Secondary | ICD-10-CM | POA: Diagnosis not present

## 2024-03-29 DIAGNOSIS — I2510 Atherosclerotic heart disease of native coronary artery without angina pectoris: Secondary | ICD-10-CM | POA: Diagnosis not present

## 2024-03-29 DIAGNOSIS — N186 End stage renal disease: Secondary | ICD-10-CM | POA: Diagnosis not present

## 2024-03-29 DIAGNOSIS — M79604 Pain in right leg: Secondary | ICD-10-CM | POA: Diagnosis not present

## 2024-03-29 DIAGNOSIS — M4186 Other forms of scoliosis, lumbar region: Secondary | ICD-10-CM | POA: Diagnosis not present

## 2024-03-29 DIAGNOSIS — J45909 Unspecified asthma, uncomplicated: Secondary | ICD-10-CM | POA: Insufficient documentation

## 2024-03-29 MED ORDER — HYDROCODONE-ACETAMINOPHEN 5-325 MG PO TABS
1.0000 | ORAL_TABLET | Freq: Once | ORAL | Status: AC
  Administered 2024-03-29: 1 via ORAL
  Filled 2024-03-29: qty 1

## 2024-03-29 MED ORDER — OXYCODONE-ACETAMINOPHEN 5-325 MG PO TABS: 1.0000 | ORAL_TABLET | Freq: Four times a day (QID) | ORAL | 0 refills | Status: AC | PRN

## 2024-03-29 MED ORDER — GABAPENTIN 100 MG PO CAPS: 200.0000 mg | ORAL_CAPSULE | Freq: Once | ORAL | Status: DC

## 2024-03-29 MED ORDER — FENTANYL CITRATE (PF) 100 MCG/2ML IJ SOLN
50.0000 ug | Freq: Once | INTRAMUSCULAR | Status: AC
  Administered 2024-03-29: 50 ug via INTRAMUSCULAR
  Filled 2024-03-29: qty 2

## 2024-03-29 NOTE — ED Triage Notes (Signed)
 Pt bib EMS for continued pain to R leg. Pt was seen here for same last night.

## 2024-03-29 NOTE — ED Provider Notes (Signed)
 [facility] AT [facility] Provider Note  CSN: [id number] Arrival date & time: 03/29/24 9641  Chief Complaint(s) Leg Pain  HPI Julia Santana is a 83 y.o. female with PMH ESRD on hemodialysis Tuesday Thursday Saturday, HTN, CAD who presents emerged from for evaluation of leg pain.  Patient was seen yesterday in the emerged part for the same complaints and ultimately was pain controlled with negative x-ray imaging.  Patient states that they did offer admission for pain control but she elected to go home.  She went home and symptoms worsened so she return to the Emergency Department for further evaluation.  States pain is primarily in the back and radiates down the leg.  Denies trauma.  Denies chest pain, shortness of breath, headache, fever or other systemic symptoms.   Past Medical History Past Medical History:  Diagnosis Date   Anemia of chronic disease    Asthma    CKD (chronic kidney disease) stage 4, GFR 15-29 ml/min (HCC)    Coronary atherosclerosis of native coronary artery    Multivessel status post CABG 8/13   COVID 04/02/2023   positive covid on 04/02/23, completely resolved per husband on 05/15/23   Essential hypertension    GERD (gastroesophageal reflux disease)    Gout    Hyperparathyroidism (HCC)    Mixed hyperlipidemia    Seasonal allergies    Patient Active Problem List   Diagnosis Date Noted   CKD (chronic kidney disease) stage 5, GFR less than 15 ml/min (HCC) 04/17/2023   Anemia of chronic renal failure 02/21/2023   Goiter 09/12/2018   Coronary atherosclerosis of native coronary artery 04/26/2012   Mixed hyperlipidemia 03/20/2012   CKD (chronic kidney disease) stage 3, GFR 30-59 ml/min (HCC) 03/20/2012   Other hyperparathyroidism (HCC) 02/25/2009   Essential hypertension, benign 02/25/2009   Asthma 02/25/2009   GERD 02/25/2009   Home Medication(s) Prior to Admission medications   Medication Sig Start Date End Date Taking?  Authorizing Provider  oxyCODONE -acetaminophen  (PERCOCET/ROXICET) 5-325 MG tablet Take 1 tablet by mouth every 6 (six) hours as needed for severe pain (pain score 7-10). 03/29/24  Yes Arthi Mcdonald, MD  aspirin  81 MG tablet Take 162 mg by mouth daily.    [provider]  atorvastatin  (LIPITOR) 40 MG tablet Take 1 tablet (40 mg total) by mouth every evening.    Debera Jayson MATSU, MD  cloNIDine  (CATAPRES ) 0.1 MG tablet Take 1 tablet by mouth twice daily 02/01/24   Debera Jayson MATSU, MD  famotidine  (PEPCID ) 20 MG tablet Take 1 tablet (20 mg total) by mouth 2 (two) times daily. Patient taking differently: Take 20 mg by mouth 3 (three) times daily. 04/23/23 12/24/24  Cottie Donnice PARAS, MD  hydrALAZINE  (APRESOLINE ) 50 MG tablet Take 50 mg by mouth 3 (three) times daily.    [provider]  isosorbide  mononitrate (IMDUR ) 30 MG 24 hr tablet Take 1 tablet by mouth once daily 03/19/24   McDowell, Samuel G, MD  metoprolol  succinate (TOPROL -XL) 100 MG 24 hr tablet Take 100 mg by mouth daily. 02/02/22   [provider]  oxyCODONE -acetaminophen  (PERCOCET/ROXICET) 5-325 MG tablet Take 1 tablet by mouth every 8 (eight) hours as needed for severe pain (pain score 7-10). 03/28/24   Towana Ozell BROCKS, MD  pantoprazole  (PROTONIX ) 40 MG tablet Take 40 mg by mouth 2 (two) times daily.     [provider]  torsemide  (DEMADEX ) 100 MG tablet Take 1/2 (one-half) tablet by mouth once daily 02/15/24  Debera Jayson MATSU, MD  traMADol  (ULTRAM ) 50 MG tablet Take 1 tablet (50 mg total) by mouth every 6 (six) hours as needed for up to 20 doses for severe pain. 05/16/23   Bethanie Cough, PA-C                                                                                                                                    Past Surgical History Past Surgical History:  Procedure Laterality Date   AV FISTULA PLACEMENT Left 05/16/2023   Procedure: INSERTION OF ARTERIOVENOUS GRAFT LEFT ARM;  Surgeon: Gretta Lonni PARAS, MD;  Location: [facility] OR;  Service: Vascular;  Laterality: Left;   CORONARY ARTERY BYPASS GRAFT  03/28/2012   Procedure: CORONARY ARTERY BYPASS GRAFTING (CABG);  Surgeon: Maude Fleeta Ochoa, MD;  Location: [facility] OR;  Service: Open Heart Surgery;  Laterality: N/A;  times 3, using Left Internal Mammary and Right Greater Saphenous  Vein Graft harvested endoscopically   PARATHYROIDECTOMY  12/15/2011   Procedure: PARATHYROIDECTOMY;  Surgeon: Oneil DELENA Budge, MD;  Location: [facility] ORS;  Service: General;  Laterality: Bilateral;   Family History Family History  Problem Relation Age of Onset   Hypertension Mother    Diabetes type II Mother    Diabetes type II Sister    Diabetes type II Brother     Social History Social History   Tobacco Use   Smoking status: Former    Current packs/day: 0.00    Average packs/day: 1 pack/day for 20.0 years (20.0 ttl pk-yrs)    Types: Cigarettes    Start date: 08/28/1960    Quit date: 08/28/1980    Years since quitting: 43.6   Smokeless tobacco: Never   Tobacco comments:    quit smoking about 25 years ago  Vaping Use   Vaping status: Never Used  Substance Use Topics   Alcohol  use: No   Drug use: No   Allergies Cortisone and Furosemide   Review of Systems Review of Systems  Musculoskeletal:  Positive for arthralgias, back pain and myalgias.    Physical Exam Vital Signs  I have reviewed the triage vital signs BP (!) 131/56 (BP Location: Right Arm)   Pulse 71   Temp 97.6 F (36.4 C) (Oral)   Resp 18   Ht 5' 4 (1.626 m)   Wt 55 kg   SpO2 95%   BMI 20.81 kg/m   Physical Exam Vitals and nursing note reviewed.  Constitutional:      General: She is not in acute distress.    Appearance: She is well-developed.  HENT:     Head: Normocephalic and atraumatic.  Eyes:     Conjunctiva/sclera: Conjunctivae normal.  Cardiovascular:     Rate and Rhythm: Normal rate and regular rhythm.     Heart sounds: No murmur heard. Pulmonary:     Effort:  Pulmonary effort is normal. No respiratory distress.     Breath sounds: Normal  breath sounds.  Abdominal:     Palpations: Abdomen is soft.     Tenderness: There is no abdominal tenderness.  Musculoskeletal:        General: Tenderness present. No swelling.     Cervical back: Neck supple.  Skin:    General: Skin is warm and dry.     Capillary Refill: Capillary refill takes less than 2 seconds.  Neurological:     Mental Status: She is alert.  Psychiatric:        Mood and Affect: Mood normal.     ED Results and Treatments Labs (all labs ordered are listed, but only abnormal results are displayed) Labs Reviewed - No data to display                                                                                                                        Radiology CT Lumbar Spine Wo Contrast Result Date: 03/29/2024 CLINICAL DATA:  83 year old female with low back pain. Pain radiating to the right leg. EXAM: CT LUMBAR SPINE WITHOUT CONTRAST TECHNIQUE: Multidetector CT imaging of the lumbar spine was performed without intravenous contrast administration. Multiplanar CT image reconstructions were also generated. RADIATION DOSE REDUCTION: This exam was performed according to the departmental dose-optimization program which includes automated exposure control, adjustment of the mA and/or kV according to patient size and/or use of iterative reconstruction technique. COMPARISON:  Report of CT Abdomen and Pelvis 04/22/2023 (no images available). FINDINGS: Segmentation: Normal. Alignment: Mild levoconvex lower thoracic and dextroconvex lumbar scoliosis. Maintained lumbar lordosis with multilevel mild degenerative grade 1 anterolisthesis, most pronounced at L4-L5 in measuring 4-5 mm there. Similar mild retrolisthesis L5-S1. Vertebrae: Mild osteopenia for age. Maintained visible vertebral height. Visible lower ribs also appear intact. Lumbar vertebrae intact. Visible sacrum and SI joints intact with SI joint  degeneration bilaterally. Paraspinal and other soft tissues: Extensive Aortoiliac calcified atherosclerosis. Normal caliber abdominal aorta. Vascular patency is not evaluated in the absence of IV contrast. Advanced visceral calcified atherosclerosis also. Partial bilateral renal atrophy. Grossly negative visible costophrenic angles. Lumbar paraspinal soft tissues within normal limits. Disc levels: Mild for age lower thoracic and upper lumbar spine degeneration through L1-L2. L2-L3 and L3-L4 mild anterolisthesis. Disc, disc osteophyte complex, mild to moderate posterior element hypertrophy. No significant spinal stenosis. L4-L5: Grade 1 anterolisthesis with more advanced disc degeneration, vacuum disc, bulky pseudo disc or disc osteophyte complex. Bulky moderate to severe facet and ligament flavum hypertrophy. Moderate spinal stenosis with suspected severe lateral recess stenosis L5 nerve levels (series 4, image 84). Moderate to severe bilateral L4 foraminal stenosis. L5-S1: Retrolisthesis with disc degeneration and bulky disc osteophyte complex to the right. Less pronounced posterior element hypertrophy. No significant spinal stenosis. No high-grade lateral recess stenosis. But moderate to severe right L5 foraminal stenosis. IMPRESSION: 1. No acute osseous abnormality in the Lumbar Spine. Mild scoliosis and multilevel lumbar spondylolisthesis. 2. Moderate multifactorial spinal and up to severe lateral recess and foraminal stenosis suspected at L4-L5. And severe right foraminal  stenosis at L5-S1. Query right L4 and/or L5 radiculitis. 3. Mild for age lumbar spine degeneration above L4. 4. Severe calcified atherosclerosis. Partial renal atrophy. Aortic Atherosclerosis (ICD10-I70.0). Electronically Signed   By: VEAR Hurst M.D.   On: 03/29/2024 05:20   DG Chest 1 View Result Date: 03/28/2024 CLINICAL DATA:  Leg pain and back pain. EXAM: CHEST  1 VIEW COMPARISON:  03/25/2024 FINDINGS: Postoperative changes in the  mediastinum. Heart size and pulmonary vascularity are normal for technique. Developing linear atelectasis in the lung bases since previous study. No airspace disease or consolidation. No pleural effusion or pneumothorax. Mediastinal contours appear intact. Degenerative changes in the spine and shoulders. Calcification of the aorta. IMPRESSION: Developing linear atelectasis in the lung bases. No focal consolidation. Electronically Signed   By: Elsie Gravely M.D.   On: 03/28/2024 17:53   DG Knee Complete 4 Views Right Result Date: 03/28/2024 CLINICAL DATA:  Right leg pain. EXAM: RIGHT KNEE - COMPLETE 4+ VIEW COMPARISON:  No prior comparison studies are available. FINDINGS: Degenerative changes in the right knee with medial greater than lateral compartment narrowing. Minimal osteophyte formation. Prominent patellar enthesophytes. Calcification in the medial and lateral compartment consistent with chondrocalcinosis. No evidence of acute fracture or dislocation. No focal bone lesion or bone destruction. No significant effusions. Vascular calcifications. IMPRESSION: Moderate degenerative changes in the right knee with chondrocalcinosis. No acute bony abnormalities. Electronically Signed   By: Elsie Gravely M.D.   On: 03/28/2024 17:51   DG Hip Unilat With Pelvis 2-3 Views Right Result Date: 03/28/2024 CLINICAL DATA:  Back pain and leg pain. EXAM: DG HIP (WITH OR WITHOUT PELVIS) 2-3V RIGHT COMPARISON:  None Available. FINDINGS: Degenerative changes in the lower lumbar spine and hips. Right hip demonstrates moderate degenerative change with joint space narrowing and acetabular sclerosis as well as small osteophyte formation on both sides of the joint. No evidence of acute fracture or dislocation of the right hip. Pelvis appears intact. SI joints and symphysis pubis are not displaced. Vascular calcifications. Calcified phleboliths. Focal calcification in the left pelvis likely represents a calcified uterine fibroid.  IMPRESSION: Moderate degenerative changes in the right hip. No acute displaced fractures are identified. Electronically Signed   By: Elsie Gravely M.D.   On: 03/28/2024 17:50   DG Shoulder Right Result Date: 03/28/2024 CLINICAL DATA:  Right shoulder pain. EXAM: RIGHT SHOULDER - 2+ VIEW COMPARISON:  None Available. FINDINGS: Degenerative changes in the glenohumeral and acromioclavicular joints. Calcification in the subacromial space suggesting calcific tendinosis. No evidence of acute fracture or dislocation. No focal bone lesion or bone destruction. Soft tissues are unremarkable. Postoperative changes in the mediastinum. IMPRESSION: Degenerative changes in the right shoulder. Subacromial calcification suggesting calcific tendinosis. No acute bony abnormalities. Electronically Signed   By: Elsie Gravely M.D.   On: 03/28/2024 17:49    Pertinent labs & imaging results that were available during my care of the patient were reviewed by me and considered in my medical decision making (see MDM for details).  Medications Ordered in ED Medications  HYDROcodone -acetaminophen  (NORCO/VICODIN) 5-325 MG per tablet 1 tablet (1 tablet Oral Given 03/29/24 0415)  fentaNYL  (SUBLIMAZE ) injection 50 mcg (50 mcg Intramuscular Given 03/29/24 0452)  Procedures Procedures  (including critical care time)  Medical Decision Making / ED Course   This patient presents to the ED for concern of back pain, leg pain, this involves an extensive number of treatment options, and is a complaint that carries with it a high risk of complications and morbidity.  The differential diagnosis includes sciatica, fracture, musculoskeletal pain, calciphylaxis   MDM: Patient seen emerged part for evaluation of back pain and leg pain.  Physical exam with tenderness in the L-spine, positive straight leg test.   X-ray imaging performed earlier today was reassuringly negative we will not repeat given no new trauma.  CT L-spine obtained that is reassuringly negative for acute pathology but does show multifactorial spinal and severe lateral recess and foraminal stenosis at L4-L5, L5-S1.  Presentation consistent with sciatica and pain controlled here in the emergency room.  Additional history obtained from family who states that they return to the emergency department because they were unable to fill the pain medications as the pharmacy was closed but the patient left the ER earlier today.  Thus a prepack of oxycodone  was brought to the patient to bridge her until the pharmacy opens this morning.  On reevaluation her pain is significant improved.  At this time she does not meet inpatient criteria for admission will be discharged with outpatient follow-up.   Additional history obtained: -Additional history obtained from multiple family members -External records from outside source obtained and reviewed including: Chart review including previous notes, labs, imaging, consultation notes      Imaging Studies ordered: I ordered imaging studies including CT L-spine I independently visualized and interpreted imaging. I agree with the radiologist interpretation   Medicines ordered and prescription drug management: Meds ordered this encounter  Medications   HYDROcodone -acetaminophen  (NORCO/VICODIN) 5-325 MG per tablet 1 tablet    Refill:  0   fentaNYL  (SUBLIMAZE ) injection 50 mcg   DISCONTD: gabapentin  (NEURONTIN ) capsule 200 mg   oxyCODONE -acetaminophen  (PERCOCET/ROXICET) 5-325 MG tablet    Sig: Take 1 tablet by mouth every 6 (six) hours as needed for severe pain (pain score 7-10).    Dispense:  6 tablet    Refill:  0    -I have reviewed the patients home medicines and have made adjustments as needed  Critical interventions none  Social Determinants of Health:  Factors impacting patients care include:  none   Reevaluation: After the interventions noted above, I reevaluated the patient and found that they have :improved  Co morbidities that complicate the patient evaluation  Past Medical History:  Diagnosis Date   Anemia of chronic disease    Asthma    CKD (chronic kidney disease) stage 4, GFR 15-29 ml/min (HCC)    Coronary atherosclerosis of native coronary artery    Multivessel status post CABG 8/13   COVID 04/02/2023   positive covid on 04/02/23, completely resolved per husband on 05/15/23   Essential hypertension    GERD (gastroesophageal reflux disease)    Gout    Hyperparathyroidism (HCC)    Mixed hyperlipidemia    Seasonal allergies       Dispostion: I considered admission for this patient, but at this time she does not meet inpatient criteria for admission will be discharged outpatient follow-up     Final Clinical Impression(s) / ED Diagnoses Final diagnoses:  Acute right-sided low back pain with right-sided sciatica     @PCDICTATION @    Albertina Dixon, MD 03/29/24 [id number]

## 2024-03-29 NOTE — ED Notes (Signed)
 Woke patient up to go over dc papers. All questions answered. Prepack given. Wheeled out to vehicle with family.

## 2024-04-01 MED FILL — Oxycodone w/ Acetaminophen Tab 5-325 MG: ORAL | Qty: 6 | Status: AC

## 2024-04-28 DEATH — deceased
